# Patient Record
Sex: Female | Born: 1945 | Race: White | Hispanic: No | Smoking: Never smoker
Health system: Southern US, Community
[De-identification: ages and names within clinical notes are randomized; demographics above are authoritative.]

## PROBLEM LIST (undated history)

## (undated) DIAGNOSIS — I10 Essential (primary) hypertension: Secondary | ICD-10-CM

## (undated) DIAGNOSIS — I209 Angina pectoris, unspecified: Secondary | ICD-10-CM

## (undated) DIAGNOSIS — R351 Nocturia: Secondary | ICD-10-CM

## (undated) DIAGNOSIS — I509 Heart failure, unspecified: Secondary | ICD-10-CM

## (undated) DIAGNOSIS — K219 Gastro-esophageal reflux disease without esophagitis: Secondary | ICD-10-CM

## (undated) DIAGNOSIS — J45909 Unspecified asthma, uncomplicated: Secondary | ICD-10-CM

## (undated) DIAGNOSIS — IMO0001 Reserved for inherently not codable concepts without codable children: Secondary | ICD-10-CM

## (undated) DIAGNOSIS — Z87442 Personal history of urinary calculi: Secondary | ICD-10-CM

## (undated) DIAGNOSIS — S83209A Unspecified tear of unspecified meniscus, current injury, unspecified knee, initial encounter: Secondary | ICD-10-CM

## (undated) DIAGNOSIS — Z8489 Family history of other specified conditions: Secondary | ICD-10-CM

## (undated) DIAGNOSIS — I2510 Atherosclerotic heart disease of native coronary artery without angina pectoris: Secondary | ICD-10-CM

## (undated) DIAGNOSIS — G709 Myoneural disorder, unspecified: Secondary | ICD-10-CM

## (undated) DIAGNOSIS — M1990 Unspecified osteoarthritis, unspecified site: Secondary | ICD-10-CM

## (undated) DIAGNOSIS — R350 Frequency of micturition: Secondary | ICD-10-CM

## (undated) DIAGNOSIS — E119 Type 2 diabetes mellitus without complications: Secondary | ICD-10-CM

## (undated) DIAGNOSIS — F419 Anxiety disorder, unspecified: Secondary | ICD-10-CM

## (undated) DIAGNOSIS — I639 Cerebral infarction, unspecified: Secondary | ICD-10-CM

## (undated) DIAGNOSIS — E0590 Thyrotoxicosis, unspecified without thyrotoxic crisis or storm: Secondary | ICD-10-CM

## (undated) DIAGNOSIS — I219 Acute myocardial infarction, unspecified: Secondary | ICD-10-CM

## (undated) HISTORY — PX: CHOLECYSTECTOMY: SHX55

## (undated) HISTORY — PX: ABDOMINAL HYSTERECTOMY: SHX81

## (undated) HISTORY — DX: Atherosclerotic heart disease of native coronary artery without angina pectoris: I25.10

## (undated) HISTORY — PX: CARDIAC CATHETERIZATION: SHX172

## (undated) HISTORY — PX: COLONOSCOPY: SHX174

## (undated) HISTORY — PX: CATARACT EXTRACTION, BILATERAL: SHX1313

---

## 1978-11-17 DIAGNOSIS — I219 Acute myocardial infarction, unspecified: Secondary | ICD-10-CM

## 1978-11-17 HISTORY — DX: Acute myocardial infarction, unspecified: I21.9

## 1997-11-24 ENCOUNTER — Ambulatory Visit: Admission: RE | Admit: 1997-11-24 | Discharge: 1997-11-24 | Payer: Self-pay | Admitting: Pulmonary Disease

## 2013-04-06 ENCOUNTER — Encounter (HOSPITAL_COMMUNITY): Payer: Self-pay | Admitting: *Deleted

## 2013-04-06 ENCOUNTER — Telehealth: Payer: Self-pay

## 2013-04-06 ENCOUNTER — Other Ambulatory Visit: Payer: Self-pay

## 2013-04-06 DIAGNOSIS — K8689 Other specified diseases of pancreas: Secondary | ICD-10-CM

## 2013-04-06 NOTE — Telephone Encounter (Signed)
Pt has been scheduled for EUS referred by Dr Lyda Jester on 04/08/13 12 noon, pt needs to bring CT on a disc to the appt.  Pt needs instructions and meds reviewed. Left message on machine to call back

## 2013-04-06 NOTE — Telephone Encounter (Signed)
EUS scheduled, pt instructed and medications reviewed.   Patient to call with any questions or concerns. Pt will get CT to bring on disc

## 2013-04-07 ENCOUNTER — Encounter (HOSPITAL_COMMUNITY): Payer: Self-pay | Admitting: *Deleted

## 2013-04-07 ENCOUNTER — Encounter (HOSPITAL_COMMUNITY): Payer: Self-pay | Admitting: Pharmacy Technician

## 2013-04-08 ENCOUNTER — Encounter (HOSPITAL_COMMUNITY): Payer: Self-pay | Admitting: Gastroenterology

## 2013-04-08 ENCOUNTER — Encounter (HOSPITAL_COMMUNITY): Payer: Medicare Other | Admitting: Anesthesiology

## 2013-04-08 ENCOUNTER — Encounter (HOSPITAL_COMMUNITY)
Admission: RE | Disposition: A | Discharge status: Home / Self Care | Payer: Self-pay | Source: Ambulatory Visit | Attending: Gastroenterology

## 2013-04-08 ENCOUNTER — Ambulatory Visit (HOSPITAL_COMMUNITY)
Admission: RE | Admit: 2013-04-08 | Discharge: 2013-04-08 | Disposition: A | Discharge status: Home / Self Care | Payer: Medicare Other | Source: Ambulatory Visit | Attending: Gastroenterology | Admitting: Gastroenterology

## 2013-04-08 ENCOUNTER — Ambulatory Visit (HOSPITAL_COMMUNITY): Payer: Medicare Other | Admitting: Anesthesiology

## 2013-04-08 DIAGNOSIS — Z8673 Personal history of transient ischemic attack (TIA), and cerebral infarction without residual deficits: Secondary | ICD-10-CM | POA: Insufficient documentation

## 2013-04-08 DIAGNOSIS — E1149 Type 2 diabetes mellitus with other diabetic neurological complication: Secondary | ICD-10-CM | POA: Insufficient documentation

## 2013-04-08 DIAGNOSIS — E1142 Type 2 diabetes mellitus with diabetic polyneuropathy: Secondary | ICD-10-CM | POA: Insufficient documentation

## 2013-04-08 DIAGNOSIS — I10 Essential (primary) hypertension: Secondary | ICD-10-CM | POA: Insufficient documentation

## 2013-04-08 DIAGNOSIS — K8689 Other specified diseases of pancreas: Secondary | ICD-10-CM

## 2013-04-08 DIAGNOSIS — K7460 Unspecified cirrhosis of liver: Secondary | ICD-10-CM | POA: Insufficient documentation

## 2013-04-08 DIAGNOSIS — K869 Disease of pancreas, unspecified: Secondary | ICD-10-CM

## 2013-04-08 DIAGNOSIS — K219 Gastro-esophageal reflux disease without esophagitis: Secondary | ICD-10-CM | POA: Insufficient documentation

## 2013-04-08 DIAGNOSIS — I252 Old myocardial infarction: Secondary | ICD-10-CM | POA: Insufficient documentation

## 2013-04-08 DIAGNOSIS — R599 Enlarged lymph nodes, unspecified: Secondary | ICD-10-CM | POA: Insufficient documentation

## 2013-04-08 HISTORY — DX: Angina pectoris, unspecified: I20.9

## 2013-04-08 HISTORY — DX: Thyrotoxicosis, unspecified without thyrotoxic crisis or storm: E05.90

## 2013-04-08 HISTORY — DX: Type 2 diabetes mellitus without complications: E11.9

## 2013-04-08 HISTORY — DX: Myoneural disorder, unspecified: G70.9

## 2013-04-08 HISTORY — DX: Unspecified asthma, uncomplicated: J45.909

## 2013-04-08 HISTORY — DX: Anxiety disorder, unspecified: F41.9

## 2013-04-08 HISTORY — DX: Reserved for inherently not codable concepts without codable children: IMO0001

## 2013-04-08 HISTORY — DX: Cerebral infarction, unspecified: I63.9

## 2013-04-08 HISTORY — PX: EUS: SHX5427

## 2013-04-08 HISTORY — DX: Gastro-esophageal reflux disease without esophagitis: K21.9

## 2013-04-08 HISTORY — DX: Acute myocardial infarction, unspecified: I21.9

## 2013-04-08 HISTORY — DX: Essential (primary) hypertension: I10

## 2013-04-08 HISTORY — DX: Personal history of urinary calculi: Z87.442

## 2013-04-08 HISTORY — DX: Heart failure, unspecified: I50.9

## 2013-04-08 LAB — PANC CYST FLD ANLYS-PATHFNDR-TG

## 2013-04-08 SURGERY — UPPER ENDOSCOPIC ULTRASOUND (EUS) LINEAR
Anesthesia: Monitor Anesthesia Care

## 2013-04-08 MED ORDER — LIDOCAINE HCL (CARDIAC) 20 MG/ML IV SOLN
INTRAVENOUS | Status: DC | PRN
  Administered 2013-04-08: 50 mg via INTRAVENOUS

## 2013-04-08 MED ORDER — PROPOFOL 10 MG/ML IV BOLUS
INTRAVENOUS | Status: AC
  Filled 2013-04-08: qty 20

## 2013-04-08 MED ORDER — SODIUM CHLORIDE 0.9 % IV SOLN: INTRAVENOUS | Status: DC

## 2013-04-08 MED ORDER — CIPROFLOXACIN HCL 500 MG PO TABS: 500.0000 mg | ORAL_TABLET | Freq: Two times a day (BID) | ORAL | Status: DC

## 2013-04-08 MED ORDER — CIPROFLOXACIN IN D5W 400 MG/200ML IV SOLN
INTRAVENOUS | Status: DC | PRN
  Administered 2013-04-08: 400 mg via INTRAVENOUS

## 2013-04-08 MED ORDER — LACTATED RINGERS IV SOLN
INTRAVENOUS | Status: DC | PRN
  Administered 2013-04-08 (×2): via INTRAVENOUS

## 2013-04-08 MED ORDER — CIPROFLOXACIN IN D5W 400 MG/200ML IV SOLN
INTRAVENOUS | Status: AC
  Filled 2013-04-08: qty 200

## 2013-04-08 MED ORDER — PROPOFOL INFUSION 10 MG/ML OPTIME
INTRAVENOUS | Status: DC | PRN
  Administered 2013-04-08: 120 ug/kg/min via INTRAVENOUS

## 2013-04-08 NOTE — H&P (Signed)
  HPI: This is a woman found to have cystic lesion in tail of pancrase, periportal adenopathy, cirrhosis on recent imaging (referred by Dr. Lyda Jester)    Past Medical History  Diagnosis Date  . CHF (congestive heart failure)   . Hypertension   . Anginal pain   . Myocardial infarction   . Hyperthyroidism   . Asthma     occ. Bronchial problems  . Diabetes mellitus without complication   . 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 History of kidney stones   . GERD (gastroesophageal reflux disease)   . Neuromuscular disorder     diabetic neuropathy feet  . Bleeder's disease     "free Bleeder"  . Anxiety     Past Surgical History  Procedure Laterality Date  . Cholecystectomy      open  . Abdominal hysterectomy    . Cataract extraction, bilateral Bilateral   . Cardiac catheterization      8'14-[facility]    Current Facility-Administered Medications  Medication Dose Route Frequency Provider Last Rate Last Dose  . 0.9 %  sodium chloride infusion   Intravenous Continuous Milus Banister, MD        Allergies as of 04/06/2013  . (Not on File)    History reviewed. No pertinent family history.  History   Social History  . Marital Status: Widowed    Spouse Name: N/A    Number of Children: N/A  . Years of Education: N/A   Occupational History  . Not on file.   Social History Main Topics  . Smoking status: Never Smoker   . Smokeless tobacco: Never Used  . Alcohol Use: No  . Drug Use: No  . Sexual Activity: Not Currently   Other Topics Concern  . Not on file   Social History Narrative  . No narrative on file      Physical Exam: BP 132/71  Pulse 102  Temp(Src) 98.3 F (36.8 C) (Oral)  Resp 16  SpO2 98% Constitutional: generally well-appearing Psychiatric: alert and oriented x3 Abdomen: soft, nontender, nondistended, no obvious ascites, no peritoneal signs, normal bowel sounds     Assessment and plan: 68 y.o. female with  abnormal pancreas  For upper EUS today

## 2013-04-08 NOTE — Transfer of Care (Signed)
Immediate Anesthesia Transfer of Care Note  Patient: Casey Humphrey  Procedure(s) Performed: Procedure(s): UPPER ENDOSCOPIC ULTRASOUND (EUS) LINEAR (N/A)  Patient Location: PACU  Anesthesia Type:MAC  Level of Consciousness: sedated  Airway & Oxygen Therapy: Patient Spontanous Breathing and Patient connected to nasal cannula oxygen  Post-op Assessment: Report given to PACU RN and Post -op Vital signs reviewed and stable  Post vital signs: Reviewed and stable  Complications: No apparent anesthesia complications

## 2013-04-08 NOTE — Anesthesia Postprocedure Evaluation (Signed)
  Anesthesia Post-op Note  Patient: Casey Humphrey  Procedure(s) Performed: Procedure(s) (LRB): UPPER ENDOSCOPIC ULTRASOUND (EUS) LINEAR (N/A)  Patient Location: PACU  Anesthesia Type: MAC  Level of Consciousness: awake and alert   Airway and Oxygen Therapy: Patient Spontanous Breathing  Post-op Pain: mild  Post-op Assessment: Post-op Vital signs reviewed, Patient's Cardiovascular Status Stable, Respiratory Function Stable, Patent Airway and No signs of Nausea or vomiting  Last Vitals:  Filed Vitals:   04/08/13 1329  BP: 140/71  Pulse: 105  Temp: 36.8 C  Resp: 14    Post-op Vital Signs: stable   Complications: No apparent anesthesia complications

## 2013-04-08 NOTE — Op Note (Signed)
[facility] [place], [address]   ENDOSCOPIC ULTRASOUND PROCEDURE REPORT  PATIENT: Casey Humphrey, Casey Humphrey  MR#: [id number] BIRTHDATE: 1946-03-02  GENDER: Female ENDOSCOPIST: Milus Banister, MD REFERRED BY:  Kyra Leyland, M.D. PROCEDURE DATE:  04/08/2013 PROCEDURE:   Upper EUS w/FNA ASA CLASS:      Class IV INDICATIONS:   1.  recent CT scan showed cystic lesion in tail of pancreas, cirrhosis, periportal/hepatoduodenal LNs. MEDICATIONS: Cipro 400 mg IV and MAC sedation, administered by CRNA   DESCRIPTION OF PROCEDURE:   After the risks benefits and alternatives of the procedure were  explained, informed consent was obtained. The patient was then placed in the left, lateral, decubitus postion and IV sedation was administered. Throughout the procedure, the patients blood pressure, pulse and oxygen saturations were monitored continuously.  Under direct visualization, the Pentax Radial EUS [id number]  endoscope was introduced through the mouth  and advanced to the second portion of the duodenum .  Water was used as necessary to provide an acoustic interface.  Upon completion of the imaging, water was removed and the patient was sent to the recovery room in satisfactory condition.   Endoscopic findings: 1. Small, fleshy polyps in distal stomach.  Otherwise normal UGI tract.  EUS findings: 1. 4.3cm by 3cm cystic lesion in tail of pancreas. The lesion contains a central component (2.5cm across) of microcysts and there are larger cysts on either side. The there was not a clear solid component. The mass was sampled with a single pass with a 22 guage EUS FNA, transgastric, yielding 6cc of thin amber fluid (sent for CEA, amylase, cytology) 2. Pancreatic parenchyma was otherwise normal 3. Main pancreatic duct was normal, non-dilated and it did not clearly communicate with the cystic lesion in the tail 4. Periportal adenopathy appeared reactive. 5.  Limited views of liver, spleen, portal and splenic vessels were all normal  Impression: 4.3cm by 3cm cystic lesion in pancreatic tail. 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  Fluid was aspirated from the larger cystic cavity and sent for cytology, CEA, amylase.  The hepatoduodenal adenopathy appeared reactive.  Await final fluid tests for final recommendations.  She will complete 3 days of twice daily cipro.   _______________________________ eSigned:  Milus Banister, MD 04/08/2013 1:36 PM

## 2013-04-08 NOTE — Discharge Instructions (Signed)
YOU HAD AN ENDOSCOPIC PROCEDURE TODAY: Refer to the procedure report that was given to you for any specific questions about what was found during the examination.  If the procedure report does not answer your questions, please call your gastroenterologist to clarify. ° °YOU SHOULD EXPECT: Some feelings of bloating in the abdomen. Passage of more gas than usual.  Walking can help get rid of the air that was put into your GI tract during the procedure and reduce the bloating. If you had a lower endoscopy (such as a colonoscopy or flexible sigmoidoscopy) you may notice spotting of blood in your stool or on the toilet paper.  ° °DIET: Your first meal following the procedure should be a light meal and then it is ok to progress to your normal diet.  A half-sandwich or bowl of soup is an example of a good first meal.  Heavy or fried foods are harder to digest and may make you feel nasueas or bloated.  Drink plenty of fluids but you should avoid alcoholic beverages for 24 hours. ° °ACTIVITY: Your care partner should take you home directly after the procedure.  You should plan to take it easy, moving slowly for the rest of the day.  You can resume normal activity the day after the procedure however you should NOT DRIVE or use heavy machinery for 24 hours (because of the sedation medicines used during the test).   ° °SYMPTOMS TO REPORT IMMEDIATELY  °A gastroenterologist can be reached at any hour.  Please call your doctor's office for any of the following symptoms: ° °· Following lower endoscopy (colonoscopy, flexible sigmoidoscopy) ° Excessive amounts of blood in the stool ° Significant tenderness, worsening of abdominal pains ° Swelling of the abdomen that is new, acute ° Fever of 100° or higher °· Following upper endoscopy (EGD, EUS, ERCP) ° Vomiting of blood or coffee ground material ° New, significant abdominal pain ° New, significant chest pain or pain under the shoulder blades ° Painful or persistently difficult  swallowing ° New shortness of breath ° Black, tarry-looking stools ° °FOLLOW UP: °If any biopsies were taken you will be contacted by phone or by letter within the next 1-3 weeks.  Call your gastroenterologist if you have not heard about the biopsies in 3 weeks.  °Please also call your gastroenterologist's office with any specific questions about appointments or follow up tests. °Gastrointestinal Endoscopy °Care After °Refer to this sheet in the next few weeks. These instructions provide you with information on caring for yourself after your procedure. Your caregiver may also give you more specific instructions. Your treatment has been planned according to current medical practices, but problems sometimes occur. Call your caregiver if you have any problems or questions after your procedure. °HOME CARE INSTRUCTIONS °· If you were given medicine to help you relax (sedative), do not drive, operate machinery, or sign important documents for 24 hours. °· Avoid alcohol and hot or warm beverages for the first 24 hours after the procedure. °· Only take over-the-counter or prescription medicines for pain, discomfort, or fever as directed by your caregiver. You may resume taking your normal medicines unless your caregiver tells you otherwise. Ask your caregiver when you may resume taking medicines that may cause bleeding, such as aspirin, clopidogrel, or warfarin. °· You may return to your normal diet and activities on the day after your procedure, or as directed by your caregiver. Walking may help to reduce any bloated feeling in your abdomen. °· Drink enough fluids to   keep your urine clear or pale yellow. °· You may gargle with salt water if you have a sore throat. °SEEK IMMEDIATE MEDICAL CARE IF: °· You have severe nausea or vomiting. °· You have severe abdominal pain, abdominal cramps that last longer than 6 hours, or abdominal swelling (distention). °· You have severe shoulder or back pain. °· You have trouble  swallowing. °· You have shortness of breath, your breathing is shallow, or you are breathing faster than normal. °· You have a fever or a rapid heartbeat. °· You vomit blood or material that looks like coffee grounds. °· You have bloody, black, or tarry stools. °MAKE SURE YOU: °· Understand these instructions. °· Will watch your condition. °· Will get help right away if you are not doing well or get worse. °Document Released: 10/17/2003 Document Revised: 09/03/2011 Document Reviewed: 06/04/2011 °ExitCare® Patient Information ©2014 [employer]. ° °

## 2013-04-08 NOTE — Anesthesia Preprocedure Evaluation (Addendum)
Anesthesia Evaluation  Patient identified by MRN, date of birth, ID band Patient awake    Reviewed: Allergy & Precautions, H&P , NPO status , Patient's Chart, lab work & pertinent test results  Airway Mallampati: II TM Distance: >3 FB Neck ROM: Full    Dental no notable dental hx.    Pulmonary asthma ,  breath sounds clear to auscultation  Pulmonary exam normal       Cardiovascular hypertension, Pt. on medications + Past MI and +CHF Rhythm:Regular Rate:Normal     Neuro/Psych CVA, No Residual Symptoms negative neurological ROS  negative psych ROS   GI/Hepatic negative GI ROS, Neg liver ROS,   Endo/Other  diabetes, Type 2, Oral Hypoglycemic Agents  Renal/GU negative Renal ROS  negative genitourinary   Musculoskeletal negative musculoskeletal ROS (+)   Abdominal   Peds negative pediatric ROS (+)  Hematology negative hematology ROS (+)   Anesthesia Other Findings   Reproductive/Obstetrics negative OB ROS                          Anesthesia Physical Anesthesia Plan  ASA: III  Anesthesia Plan: MAC   Post-op Pain Management:    Induction:   Airway Management Planned:   Additional Equipment:   Intra-op Plan:   Post-operative Plan:   Informed Consent: I have reviewed the patients History and Physical, chart, labs and discussed the procedure including the risks, benefits and alternatives for the proposed anesthesia with the patient or authorized representative who has indicated his/her understanding and acceptance.   Dental advisory given  Plan Discussed with: CRNA  Anesthesia Plan Comments:         Anesthesia Quick Evaluation

## 2013-04-09 ENCOUNTER — Encounter (HOSPITAL_COMMUNITY): Payer: Self-pay | Admitting: Gastroenterology

## 2014-03-23 DIAGNOSIS — J0101 Acute recurrent maxillary sinusitis: Secondary | ICD-10-CM | POA: Diagnosis not present

## 2014-03-28 DIAGNOSIS — M1712 Unilateral primary osteoarthritis, left knee: Secondary | ICD-10-CM | POA: Diagnosis not present

## 2014-04-12 DIAGNOSIS — I5032 Chronic diastolic (congestive) heart failure: Secondary | ICD-10-CM | POA: Diagnosis not present

## 2014-04-12 DIAGNOSIS — I2510 Atherosclerotic heart disease of native coronary artery without angina pectoris: Secondary | ICD-10-CM | POA: Diagnosis not present

## 2014-04-12 DIAGNOSIS — E119 Type 2 diabetes mellitus without complications: Secondary | ICD-10-CM | POA: Diagnosis not present

## 2014-04-12 DIAGNOSIS — I110 Hypertensive heart disease with heart failure: Secondary | ICD-10-CM | POA: Diagnosis not present

## 2014-04-12 DIAGNOSIS — E785 Hyperlipidemia, unspecified: Secondary | ICD-10-CM | POA: Diagnosis not present

## 2014-04-13 ENCOUNTER — Other Ambulatory Visit: Payer: Self-pay | Admitting: Surgical

## 2014-04-21 NOTE — Patient Instructions (Addendum)
SHALEKA BRINES  04/21/2014   Your procedure is scheduled on: 04/27/14   Report to [facility] Main  Entrance and follow signs to               [place] at 9:30 AM.   Call this number if you have problems the morning of surgery [phone]   Remember:  Do not eat food or drink liquids :After Midnight.     Take these medicines the morning of surgery with A SIP OF WATER: LEVOTHYROXINE / METOPROLOL / RANITIDINE                               You may not have any metal on your body including hair pins and              piercings  Do not wear jewelry, make-up, lotions, powders or perfumes.             Do not wear nail polish.  Do not shave  48 hours prior to surgery.              Men may shave face and neck.   Do not bring valuables to the hospital. [place].  Contacts, dentures or bridgework may not be worn into surgery.  Leave suitcase in the car. After surgery it may be brought to your room.     Patients discharged the day of surgery will not be allowed to drive home.  Name and phone number of your driver:  Special Instructions: N/A              Please read over the following fact sheets you were given: _____________________________________________________________________                                                     [place]  Before surgery, you can play an important role.  Because skin is not sterile, your skin needs to be as free of germs as possible.  You can reduce the number of germs on your skin by washing with CHG (chlorahexidine gluconate) soap before surgery.  CHG is an antiseptic cleaner which kills germs and bonds with the skin to continue killing germs even after washing. Please DO NOT use if you have an allergy to CHG or antibacterial soaps.  If your skin becomes reddened/irritated stop using the CHG and inform your nurse when you arrive at Short Stay. Do  not shave (including legs and underarms) for at least 48 hours prior to the first CHG shower.  You may shave your face. Please follow these instructions carefully:   1.  Shower with CHG Soap the night before surgery and the  morning of Surgery.   2.  If you choose to wash your hair, wash your hair first as usual with your  normal  Shampoo.   3.  After you shampoo, rinse your hair and body thoroughly to remove the  shampoo.  4.  Use CHG as you would any other liquid soap.  You can apply chg directly  to the skin and wash . Gently wash with scrungie or clean wascloth    5.  Apply the CHG Soap to your body ONLY FROM THE NECK DOWN.   Do not use on open                           Wound or open sores. Avoid contact with eyes, ears mouth and genitals (private parts).                        Genitals (private parts) with your normal soap.              6.  Wash thoroughly, paying special attention to the area where your surgery  will be performed.   7.  Thoroughly rinse your body with warm water from the neck down.   8.  DO NOT shower/wash with your normal soap after using and rinsing off  the CHG Soap .                9.  Pat yourself dry with a clean towel.             10.  Wear clean pajamas.             11.  Place clean sheets on your bed the night of your first shower and do not  sleep with pets.  Day of Surgery : Do not apply any lotions/deodorants the morning of surgery.  Please wear clean clothes to the hospital/surgery center.  FAILURE TO FOLLOW THESE INSTRUCTIONS MAY RESULT IN THE CANCELLATION OF YOUR SURGERY    PATIENT SIGNATURE_________________________________  ______________________________________________________________________

## 2014-04-22 ENCOUNTER — Encounter (HOSPITAL_COMMUNITY)
Admission: RE | Admit: 2014-04-22 | Discharge: 2014-04-22 | Disposition: A | Discharge status: Home / Self Care | Payer: Medicare Other | Source: Ambulatory Visit | Attending: Orthopedic Surgery | Admitting: Orthopedic Surgery

## 2014-04-22 ENCOUNTER — Encounter (HOSPITAL_COMMUNITY): Payer: Self-pay

## 2014-04-22 DIAGNOSIS — Z880 Allergy status to penicillin: Secondary | ICD-10-CM | POA: Insufficient documentation

## 2014-04-22 DIAGNOSIS — Z7982 Long term (current) use of aspirin: Secondary | ICD-10-CM | POA: Insufficient documentation

## 2014-04-22 DIAGNOSIS — I10 Essential (primary) hypertension: Secondary | ICD-10-CM | POA: Diagnosis not present

## 2014-04-22 DIAGNOSIS — Z01812 Encounter for preprocedural laboratory examination: Secondary | ICD-10-CM | POA: Diagnosis not present

## 2014-04-22 DIAGNOSIS — E119 Type 2 diabetes mellitus without complications: Secondary | ICD-10-CM | POA: Diagnosis not present

## 2014-04-22 DIAGNOSIS — I252 Old myocardial infarction: Secondary | ICD-10-CM | POA: Diagnosis not present

## 2014-04-22 DIAGNOSIS — S83282A Other tear of lateral meniscus, current injury, left knee, initial encounter: Secondary | ICD-10-CM | POA: Insufficient documentation

## 2014-04-22 DIAGNOSIS — M179 Osteoarthritis of knee, unspecified: Secondary | ICD-10-CM | POA: Insufficient documentation

## 2014-04-22 DIAGNOSIS — J45909 Unspecified asthma, uncomplicated: Secondary | ICD-10-CM | POA: Insufficient documentation

## 2014-04-22 DIAGNOSIS — Z79899 Other long term (current) drug therapy: Secondary | ICD-10-CM | POA: Insufficient documentation

## 2014-04-22 DIAGNOSIS — X58XXXA Exposure to other specified factors, initial encounter: Secondary | ICD-10-CM | POA: Diagnosis not present

## 2014-04-22 DIAGNOSIS — I509 Heart failure, unspecified: Secondary | ICD-10-CM | POA: Insufficient documentation

## 2014-04-22 DIAGNOSIS — Z8673 Personal history of transient ischemic attack (TIA), and cerebral infarction without residual deficits: Secondary | ICD-10-CM | POA: Diagnosis not present

## 2014-04-22 DIAGNOSIS — K219 Gastro-esophageal reflux disease without esophagitis: Secondary | ICD-10-CM | POA: Diagnosis not present

## 2014-04-22 HISTORY — DX: Unspecified osteoarthritis, unspecified site: M19.90

## 2014-04-22 HISTORY — DX: Frequency of micturition: R35.0

## 2014-04-22 HISTORY — DX: Nocturia: R35.1

## 2014-04-22 HISTORY — DX: Reserved for inherently not codable concepts without codable children: IMO0001

## 2014-04-22 HISTORY — DX: Family history of other specified conditions: Z84.89

## 2014-04-22 HISTORY — DX: Unspecified tear of unspecified meniscus, current injury, unspecified knee, initial encounter: S83.209A

## 2014-04-22 LAB — CBC
HCT: 39.7 % (ref 36.0–46.0)
Hemoglobin: 13.2 g/dL (ref 12.0–15.0)
MCH: 33.8 pg (ref 26.0–34.0)
MCHC: 33.2 g/dL (ref 30.0–36.0)
MCV: 101.8 fL — ABNORMAL HIGH (ref 78.0–100.0)
Platelets: 124 10*3/uL — ABNORMAL LOW (ref 150–400)
RBC: 3.9 MIL/uL (ref 3.87–5.11)
RDW: 13.5 % (ref 11.5–15.5)
WBC: 6.2 10*3/uL (ref 4.0–10.5)

## 2014-04-22 LAB — BASIC METABOLIC PANEL
Anion gap: 7 (ref 5–15)
BUN: 18 mg/dL (ref 6–23)
CO2: 26 mmol/L (ref 19–32)
Calcium: 9.8 mg/dL (ref 8.4–10.5)
Chloride: 107 mmol/L (ref 96–112)
Creatinine, Ser: 0.82 mg/dL (ref 0.50–1.10)
GFR calc Af Amer: 82 mL/min — ABNORMAL LOW (ref >90)
GFR calc non Af Amer: 71 mL/min — ABNORMAL LOW (ref >90)
Glucose, Bld: 109 mg/dL — ABNORMAL HIGH (ref 70–99)
Potassium: 3.9 mmol/L (ref 3.5–5.1)
Sodium: 140 mmol/L (ref 135–145)

## 2014-04-22 NOTE — Progress Notes (Signed)
CBC results done 04/22/14 faxed via EPIC to Dr Gladstone Lighter.

## 2014-04-22 NOTE — H&P (Signed)
Casey Humphrey is an 69 y.o. female.   Chief Complaint: left knee pain HPI: The patient presented with the chief complaint of left knee pain. She reports that she has had issues with the left knee for about 4 months. No known injury. She also reports having swelling, locking, and catching in the knee. 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   Past Medical History  Diagnosis Date  . CHF (congestive heart failure)   . Hypertension   . Anginal pain   . Myocardial infarction   . Hyperthyroidism   . Asthma     occ. Bronchial problems  . Diabetes mellitus without complication   . 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 History of kidney stones   . GERD (gastroesophageal reflux disease)   . Neuromuscular disorder     diabetic neuropathy feet  . Bleeder's disease     "free Bleeder"  . Anxiety     Past Surgical History  Procedure Laterality Date  . Cholecystectomy      open  . Abdominal hysterectomy    . Cataract extraction, bilateral Bilateral   . Cardiac catheterization      8'14-[facility]  . Eus N/A 04/08/2013    Procedure: UPPER ENDOSCOPIC ULTRASOUND (EUS) LINEAR;  Surgeon: Milus Banister, MD;  Location: [facility] ENDOSCOPY;  Service: Endoscopy;  Laterality: N/A;    Social History:  reports that she has never smoked. She has never used smokeless tobacco. She reports that she does not drink alcohol or use illicit drugs.  Allergies:  Allergies  Allergen Reactions  . Codeine Nausea And Vomiting  . Quinine Derivatives Nausea And Vomiting  . Adhesive [Tape] Rash  . Penicillins Rash  . Procardia [Nifedipine] Rash    Current outpatient prescriptions:  .  aspirin EC 81 MG tablet, Take 81 mg by mouth daily., Disp: , Rfl:  .  furosemide (LASIX) 20 MG tablet, Take 20 mg by mouth every morning., Disp: , Rfl:  .  ipratropium (ATROVENT HFA) 17 MCG/ACT inhaler, Inhale 2 puffs into the lungs every 6 (six) hours., Disp: , Rfl:  .   levothyroxine (SYNTHROID, LEVOTHROID) 100 MCG tablet, Take 100 mcg by mouth daily before breakfast., Disp: , Rfl:  .  LORazepam (ATIVAN) 1 MG tablet, Take 0.5 mg by mouth daily as needed for anxiety., Disp: , Rfl:  .  losartan (COZAAR) 25 MG tablet, Take 25 mg by mouth every morning., Disp: , Rfl:  .  metFORMIN (GLUCOPHAGE-XR) 500 MG 24 hr tablet, Take 500 mg by mouth daily with breakfast., Disp: , Rfl:  .  metoprolol (LOPRESSOR) 50 MG tablet, Take 50 mg by mouth 2 (two) times daily., Disp: , Rfl:  .  polycarbophil (FIBERCON) 625 MG tablet, Take 625 mg by mouth daily., Disp: , Rfl:  .  ranitidine (ZANTAC) 150 MG tablet, Take 150 mg by mouth every morning., Disp: , Rfl:    Review of Systems  Constitutional: Positive for chills and diaphoresis. Negative for fever, weight loss and malaise/fatigue.  HENT: Negative.   Eyes: Negative.   Respiratory: Positive for shortness of breath. Negative for cough, hemoptysis, sputum production and wheezing.   Cardiovascular: Positive for palpitations and leg swelling. Negative for chest pain, orthopnea, claudication and PND.  Gastrointestinal: Negative.   Genitourinary: Negative for dysuria, urgency, frequency, hematuria and flank pain.       Positive for incontinence  Musculoskeletal: Positive for myalgias, back pain and joint pain. Negative for falls and  neck pain.  Skin: Negative.   Neurological: Negative for weakness.  Endo/Heme/Allergies: Negative for environmental allergies and polydipsia. Bruises/bleeds easily.  Psychiatric/Behavioral: Negative.    Vitals  Weight: 191 lb Height: 61in Body Surface Area: 1.85 m Body Mass Index: 36.09 kg/m  BP: 150/80 (Sitting, Left Arm, Standard) HR: 76 bpm  Physical Exam  Constitutional: She is oriented to person, place, and time. She appears well-developed. No distress.  Overweight  HENT:  Head: Normocephalic and atraumatic.  Right Ear: External ear normal.  Left Ear: External ear normal.  Nose:  Nose normal.  Mouth/Throat: Oropharynx is clear and moist.  Eyes: Conjunctivae and EOM are normal.  Neck: Normal range of motion. Neck supple.  Cardiovascular: Normal rate, regular rhythm, normal heart sounds and intact distal pulses.   No murmur heard. Respiratory: Effort normal and breath sounds normal. No respiratory distress. She has no wheezes.  GI: Soft. Bowel sounds are normal. She exhibits no distension. There is no tenderness.  Musculoskeletal:       Right hip: Normal.       Left hip: Normal.       Right knee: Normal.       Left knee: She exhibits decreased range of motion, swelling and effusion. She exhibits no erythema. Tenderness found. Medial joint line and lateral joint line tenderness noted.       Right lower leg: She exhibits no tenderness and no swelling.       Left lower leg: She exhibits no tenderness and no swelling.  5-125 degrees ROM in left knee Positive Mc[place]rays left knee  Neurological: She is alert and oriented to person, place, and time. She has normal strength and normal reflexes. No sensory deficit.  Skin: No rash noted. She is not diaphoretic. No erythema.  Psychiatric: She has a normal mood and affect. Her behavior is normal.     Assessment/Plan Left knee lateral meniscus tear She needs a left knee arthroscopy with lateral meniscus debridement. She does have significant arthritic changes in addition to the tear but the patient would like to see what relief she can get with the knee scope before deciding on total knee arthroplasty. Risks and benefits of teh procedure discussed with the patient by Dr. Latanya Maudlin.    H&P performed by Dr. Latanya Maudlin Documented by Ardeen Jourdain, PA-C  [place], Shaely Gadberry Ander Purpura 04/22/2014, 7:59 AM

## 2014-04-22 NOTE — Progress Notes (Signed)
CARDIAC CLEARANCE dR. mUNLEY CARDIOLOGIST ON CHART WITH EKG 03/2014

## 2014-04-27 ENCOUNTER — Ambulatory Visit (HOSPITAL_COMMUNITY)
Admission: RE | Admit: 2014-04-27 | Discharge: 2014-04-27 | Disposition: A | Discharge status: Home / Self Care | Payer: Medicare Other | Source: Ambulatory Visit | Attending: Orthopedic Surgery | Admitting: Orthopedic Surgery

## 2014-04-27 ENCOUNTER — Encounter (HOSPITAL_COMMUNITY)
Admission: RE | Disposition: A | Discharge status: Home / Self Care | Payer: Self-pay | Source: Ambulatory Visit | Attending: Orthopedic Surgery

## 2014-04-27 ENCOUNTER — Ambulatory Visit (HOSPITAL_COMMUNITY): Payer: Medicare Other | Admitting: Anesthesiology

## 2014-04-27 ENCOUNTER — Encounter (HOSPITAL_COMMUNITY): Payer: Self-pay | Admitting: *Deleted

## 2014-04-27 DIAGNOSIS — M23301 Other meniscus derangements, unspecified lateral meniscus, left knee: Secondary | ICD-10-CM | POA: Diagnosis not present

## 2014-04-27 DIAGNOSIS — J45909 Unspecified asthma, uncomplicated: Secondary | ICD-10-CM | POA: Insufficient documentation

## 2014-04-27 DIAGNOSIS — M1712 Unilateral primary osteoarthritis, left knee: Secondary | ICD-10-CM | POA: Diagnosis not present

## 2014-04-27 DIAGNOSIS — E039 Hypothyroidism, unspecified: Secondary | ICD-10-CM | POA: Insufficient documentation

## 2014-04-27 DIAGNOSIS — M179 Osteoarthritis of knee, unspecified: Secondary | ICD-10-CM | POA: Diagnosis not present

## 2014-04-27 DIAGNOSIS — M23304 Other meniscus derangements, unspecified medial meniscus, left knee: Secondary | ICD-10-CM | POA: Diagnosis not present

## 2014-04-27 DIAGNOSIS — S83242A Other tear of medial meniscus, current injury, left knee, initial encounter: Secondary | ICD-10-CM | POA: Diagnosis not present

## 2014-04-27 DIAGNOSIS — X58XXXA Exposure to other specified factors, initial encounter: Secondary | ICD-10-CM | POA: Diagnosis not present

## 2014-04-27 DIAGNOSIS — Y939 Activity, unspecified: Secondary | ICD-10-CM | POA: Insufficient documentation

## 2014-04-27 DIAGNOSIS — E119 Type 2 diabetes mellitus without complications: Secondary | ICD-10-CM | POA: Diagnosis not present

## 2014-04-27 DIAGNOSIS — I509 Heart failure, unspecified: Secondary | ICD-10-CM | POA: Diagnosis not present

## 2014-04-27 DIAGNOSIS — S83282A Other tear of lateral meniscus, current injury, left knee, initial encounter: Secondary | ICD-10-CM | POA: Insufficient documentation

## 2014-04-27 DIAGNOSIS — K219 Gastro-esophageal reflux disease without esophagitis: Secondary | ICD-10-CM | POA: Insufficient documentation

## 2014-04-27 DIAGNOSIS — Z8673 Personal history of transient ischemic attack (TIA), and cerebral infarction without residual deficits: Secondary | ICD-10-CM | POA: Diagnosis not present

## 2014-04-27 DIAGNOSIS — Y929 Unspecified place or not applicable: Secondary | ICD-10-CM | POA: Diagnosis not present

## 2014-04-27 DIAGNOSIS — I10 Essential (primary) hypertension: Secondary | ICD-10-CM | POA: Diagnosis not present

## 2014-04-27 DIAGNOSIS — Y999 Unspecified external cause status: Secondary | ICD-10-CM | POA: Diagnosis not present

## 2014-04-27 DIAGNOSIS — I252 Old myocardial infarction: Secondary | ICD-10-CM | POA: Insufficient documentation

## 2014-04-27 HISTORY — PX: CHONDROPLASTY: SHX5177

## 2014-04-27 HISTORY — PX: KNEE ARTHROSCOPY WITH LATERAL MENISECTOMY: SHX6193

## 2014-04-27 LAB — GLUCOSE, CAPILLARY
Glucose-Capillary: 101 mg/dL — ABNORMAL HIGH (ref 70–99)
Glucose-Capillary: 127 mg/dL — ABNORMAL HIGH (ref 70–99)
Glucose-Capillary: 175 mg/dL — ABNORMAL HIGH (ref 70–99)

## 2014-04-27 SURGERY — CHONDROPLASTY
Anesthesia: General | Site: Knee | Laterality: Left

## 2014-04-27 MED ORDER — PROMETHAZINE HCL 25 MG/ML IJ SOLN
6.2500 mg | INTRAMUSCULAR | Status: DC | PRN
  Administered 2014-04-27: 6.25 mg via INTRAVENOUS
  Filled 2014-04-27: qty 1

## 2014-04-27 MED ORDER — STERILE WATER FOR IRRIGATION IR SOLN
Status: DC | PRN
  Administered 2014-04-27: 500 mL

## 2014-04-27 MED ORDER — BACITRACIN ZINC 500 UNIT/GM EX OINT
TOPICAL_OINTMENT | CUTANEOUS | Status: DC | PRN
  Administered 2014-04-27: 1 via TOPICAL

## 2014-04-27 MED ORDER — ONDANSETRON HCL 4 MG/2ML IJ SOLN
INTRAMUSCULAR | Status: DC | PRN
  Administered 2014-04-27 (×2): 2 mg via INTRAVENOUS

## 2014-04-27 MED ORDER — FENTANYL CITRATE 0.05 MG/ML IJ SOLN
INTRAMUSCULAR | Status: DC | PRN
  Administered 2014-04-27: 25 ug via INTRAVENOUS
  Administered 2014-04-27 (×2): 50 ug via INTRAVENOUS
  Administered 2014-04-27: 25 ug via INTRAVENOUS

## 2014-04-27 MED ORDER — BUPIVACAINE-EPINEPHRINE (PF) 0.25% -1:200000 IJ SOLN
INTRAMUSCULAR | Status: AC
  Filled 2014-04-27: qty 30

## 2014-04-27 MED ORDER — ACETAMINOPHEN 10 MG/ML IV SOLN: 1000.0000 mg | Freq: Once | INTRAVENOUS | Status: DC

## 2014-04-27 MED ORDER — LIDOCAINE HCL (CARDIAC) 20 MG/ML IV SOLN
INTRAVENOUS | Status: DC | PRN
  Administered 2014-04-27: 50 mg via INTRAVENOUS

## 2014-04-27 MED ORDER — METOCLOPRAMIDE HCL 5 MG/ML IJ SOLN
INTRAMUSCULAR | Status: AC
  Filled 2014-04-27: qty 2

## 2014-04-27 MED ORDER — LACTATED RINGERS IV SOLN
INTRAVENOUS | Status: DC
  Administered 2014-04-27 (×2): 1000 mL via INTRAVENOUS

## 2014-04-27 MED ORDER — LIDOCAINE HCL (CARDIAC) 20 MG/ML IV SOLN
INTRAVENOUS | Status: AC
  Filled 2014-04-27: qty 5

## 2014-04-27 MED ORDER — PROPOFOL 10 MG/ML IV BOLUS
INTRAVENOUS | Status: DC | PRN
  Administered 2014-04-27: 90 mg via INTRAVENOUS

## 2014-04-27 MED ORDER — ONDANSETRON HCL 4 MG/2ML IJ SOLN
INTRAMUSCULAR | Status: AC
  Filled 2014-04-27: qty 2

## 2014-04-27 MED ORDER — DEXAMETHASONE SODIUM PHOSPHATE 10 MG/ML IJ SOLN
INTRAMUSCULAR | Status: DC | PRN
  Administered 2014-04-27: 10 mg via INTRAVENOUS

## 2014-04-27 MED ORDER — METOCLOPRAMIDE HCL 5 MG/ML IJ SOLN
10.0000 mg | Freq: Once | INTRAMUSCULAR | Status: AC | PRN
  Administered 2014-04-27: 10 mg via INTRAVENOUS

## 2014-04-27 MED ORDER — FENTANYL CITRATE 0.05 MG/ML IJ SOLN
INTRAMUSCULAR | Status: AC
  Filled 2014-04-27: qty 5

## 2014-04-27 MED ORDER — BUPIVACAINE-EPINEPHRINE 0.25% -1:200000 IJ SOLN
INTRAMUSCULAR | Status: DC | PRN
  Administered 2014-04-27: 30 mL

## 2014-04-27 MED ORDER — CLINDAMYCIN PHOSPHATE 900 MG/50ML IV SOLN
900.0000 mg | INTRAVENOUS | Status: AC
  Administered 2014-04-27: 900 mg via INTRAVENOUS

## 2014-04-27 MED ORDER — LACTATED RINGERS IR SOLN
Status: DC | PRN
  Administered 2014-04-27: 3000 mL

## 2014-04-27 MED ORDER — PROPOFOL 10 MG/ML IV BOLUS
INTRAVENOUS | Status: AC
  Filled 2014-04-27: qty 20

## 2014-04-27 MED ORDER — HYDROMORPHONE HCL 1 MG/ML IJ SOLN
INTRAMUSCULAR | Status: AC
  Filled 2014-04-27: qty 1

## 2014-04-27 MED ORDER — MIDAZOLAM HCL 2 MG/2ML IJ SOLN
INTRAMUSCULAR | Status: AC
  Filled 2014-04-27: qty 2

## 2014-04-27 MED ORDER — ACETAMINOPHEN 10 MG/ML IV SOLN
1000.0000 mg | Freq: Once | INTRAVENOUS | Status: AC
  Administered 2014-04-27: 1000 mg via INTRAVENOUS
  Filled 2014-04-27: qty 100

## 2014-04-27 MED ORDER — HYDROMORPHONE HCL 2 MG PO TABS
2.0000 mg | ORAL_TABLET | ORAL | Status: DC | PRN
Start: 2014-04-27 — End: 2017-11-04

## 2014-04-27 MED ORDER — LACTATED RINGERS IV SOLN
INTRAVENOUS | Status: DC | PRN
  Administered 2014-04-27: 11:00:00 via INTRAVENOUS

## 2014-04-27 MED ORDER — HYDROMORPHONE HCL 1 MG/ML IJ SOLN
0.2500 mg | INTRAMUSCULAR | Status: DC | PRN
  Administered 2014-04-27 (×2): 0.5 mg via INTRAVENOUS

## 2014-04-27 MED ORDER — CLINDAMYCIN PHOSPHATE 900 MG/50ML IV SOLN
INTRAVENOUS | Status: AC
  Filled 2014-04-27: qty 50

## 2014-04-27 MED ORDER — BACITRACIN ZINC 500 UNIT/GM EX OINT
TOPICAL_OINTMENT | CUTANEOUS | Status: AC
  Filled 2014-04-27: qty 28.35

## 2014-04-27 MED ORDER — SODIUM CHLORIDE 0.9 % IV SOLN
10.0000 mg | INTRAVENOUS | Status: DC | PRN
  Administered 2014-04-27: 40 ug/min via INTRAVENOUS

## 2014-04-27 MED ORDER — CHLORHEXIDINE GLUCONATE 4 % EX LIQD: 60.0000 mL | Freq: Once | CUTANEOUS | Status: DC

## 2014-04-27 MED ORDER — DEXAMETHASONE SODIUM PHOSPHATE 10 MG/ML IJ SOLN
INTRAMUSCULAR | Status: AC
  Filled 2014-04-27: qty 1

## 2014-04-27 SURGICAL SUPPLY — 29 items
BANDAGE ELASTIC 4 VELCRO ST LF (GAUZE/BANDAGES/DRESSINGS) ×2 IMPLANT
BANDAGE ELASTIC 6 VELCRO ST LF (GAUZE/BANDAGES/DRESSINGS) ×2 IMPLANT
BLADE GREAT WHITE 4.2 (BLADE) ×2 IMPLANT
BLADE SURG SZ11 CARB STEEL (BLADE) ×2 IMPLANT
BNDG COHESIVE 6X5 TAN STRL LF (GAUZE/BANDAGES/DRESSINGS) ×2 IMPLANT
DRAPE SHEET LG 3/4 BI-LAMINATE (DRAPES) ×2 IMPLANT
DRSG ADAPTIC 3X8 NADH LF (GAUZE/BANDAGES/DRESSINGS) ×2 IMPLANT
DRSG EMULSION OIL 3X3 NADH (GAUZE/BANDAGES/DRESSINGS) ×2 IMPLANT
DRSG PAD ABDOMINAL 8X10 ST (GAUZE/BANDAGES/DRESSINGS) ×4 IMPLANT
DURAPREP 26ML APPLICATOR (WOUND CARE) ×2 IMPLANT
GLOVE BIOGEL PI IND STRL 8 (GLOVE) ×1 IMPLANT
GLOVE BIOGEL PI INDICATOR 8 (GLOVE) ×1
GLOVE ECLIPSE 8.0 STRL XLNG CF (GLOVE) ×2 IMPLANT
GOWN STRL REUS W/TWL LRG LVL3 (GOWN DISPOSABLE) IMPLANT
GOWN STRL REUS W/TWL XL LVL3 (GOWN DISPOSABLE) ×4 IMPLANT
KIT BASIN OR (CUSTOM PROCEDURE TRAY) IMPLANT
MANIFOLD NEPTUNE II (INSTRUMENTS) ×2 IMPLANT
PACK ARTHROSCOPY WL (CUSTOM PROCEDURE TRAY) ×2 IMPLANT
PACK ICE MAXI GEL EZY WRAP (MISCELLANEOUS) ×2 IMPLANT
PAD ABD 8X10 STRL (GAUZE/BANDAGES/DRESSINGS) ×2 IMPLANT
PAD MASON LEG HOLDER (PIN) ×2 IMPLANT
SET ARTHROSCOPY TUBING (MISCELLANEOUS) ×1
SET ARTHROSCOPY TUBING LN (MISCELLANEOUS) ×1 IMPLANT
SUT ETHILON 3 0 PS 1 (SUTURE) ×2 IMPLANT
SYR 20CC LL (SYRINGE) ×2 IMPLANT
TOWEL OR 17X26 10 PK STRL BLUE (TOWEL DISPOSABLE) ×2 IMPLANT
TUBING CONNECTING 10 (TUBING) ×2 IMPLANT
WAND 90 DEG TURBOVAC W/CORD (SURGICAL WAND) IMPLANT
WRAP KNEE MAXI GEL POST OP (GAUZE/BANDAGES/DRESSINGS) ×2 IMPLANT

## 2014-04-27 NOTE — Anesthesia Preprocedure Evaluation (Addendum)
Anesthesia Evaluation  Patient identified by MRN, date of birth, ID band Patient awake    Reviewed: Allergy & Precautions, NPO status , Patient's Chart, lab work & pertinent test results, reviewed documented beta blocker date and time   Airway Mallampati: I  TM Distance: >3 FB     Dental  (+) Edentulous Upper, Edentulous Lower, Dental Advisory Given   Pulmonary asthma ,    Pulmonary exam normal       Cardiovascular hypertension, + angina + Past MI and +CHF     Neuro/Psych Anxiety  Neuromuscular disease CVA    GI/Hepatic Neg liver ROS, GERD-  ,  Endo/Other  diabetesHyperthyroidism   Renal/GU negative Renal ROS     Musculoskeletal   Abdominal   Peds  Hematology   Anesthesia Other Findings   Reproductive/Obstetrics                            Anesthesia Physical Anesthesia Plan  ASA: III  Anesthesia Plan: General   Post-op Pain Management:    Induction: Intravenous  Airway Management Planned: Oral ETT and LMA  Additional Equipment:   Intra-op Plan:   Post-operative Plan: Extubation in OR  Informed Consent: I have reviewed the patients History and Physical, chart, labs and discussed the procedure including the risks, benefits and alternatives for the proposed anesthesia with the patient or authorized representative who has indicated his/her understanding and acceptance.   Dental advisory given  Plan Discussed with: CRNA, Anesthesiologist and Surgeon  Anesthesia Plan Comments:        Anesthesia Quick Evaluation

## 2014-04-27 NOTE — Transfer of Care (Signed)
Immediate Anesthesia Transfer of Care Note  Patient: Casey Humphrey  Procedure(s) Performed: Procedure(s): LEFT KNEE ARTHROSCOPY WITH  medial and LATERAL MENISECTOMY. (Left) abrasion CHONDROPLASTY of medial femoral condyle, abrasion chondroplasty of patella (Left)  Patient Location: PACU  Anesthesia Type:General  Level of Consciousness: awake, oriented, patient cooperative, lethargic and responds to stimulation  Airway & Oxygen Therapy: Patient Spontanous Breathing and Patient connected to face mask oxygen  Post-op Assessment: Report given to RN, Post -op Vital signs reviewed and stable and Patient moving all extremities  Post vital signs: Reviewed and stable  Last Vitals:  Filed Vitals:   04/27/14 0925  BP: 140/63  Pulse: 63  Temp: 36.8 C  Resp: 16    Complications: No apparent anesthesia complications

## 2014-04-27 NOTE — Discharge Instructions (Signed)

## 2014-04-27 NOTE — Interval H&P Note (Signed)
History and Physical Interval Note:  04/27/2014 11:02 AM  Casey Humphrey  has presented today for surgery, with the diagnosis of left knee lateral meniscal tear  The various methods of treatment have been discussed with the patient and family. After consideration of risks, benefits and other options for treatment, the patient has consented to  Procedure(s): LEFT KNEE ARTHROSCOPY WITH LATERAL MENISECTOMY (Left) as a surgical intervention .  The patient's history has been reviewed, patient examined, no change in status, stable for surgery.  I have reviewed the patient's chart and labs.  Questions were answered to the patient's satisfaction.     GIOFFRE,RONALD A

## 2014-04-27 NOTE — Progress Notes (Signed)
CBG 127 

## 2014-04-27 NOTE — Anesthesia Procedure Notes (Signed)
Procedure Name: LMA Insertion Date/Time: 04/27/2014 11:21 AM Performed by: Ofilia Neas Pre-anesthesia Checklist: Patient identified, Emergency Drugs available, Suction available, Patient being monitored and Timeout performed Patient Re-evaluated:Patient Re-evaluated prior to inductionOxygen Delivery Method: Circle system utilized Preoxygenation: Pre-oxygenation with 100% oxygen Intubation Type: IV induction LMA: LMA inserted LMA Size: 4.0 Number of attempts: 1 Placement Confirmation: positive ETCO2 Tube secured with: Tape (paper tape) Dental Injury: Teeth and Oropharynx as per pre-operative assessment

## 2014-04-27 NOTE — Anesthesia Postprocedure Evaluation (Signed)
Anesthesia Post Note  Patient: Casey Humphrey  Procedure(s) Performed: Procedure(s) (LRB): LEFT KNEE ARTHROSCOPY WITH  medial and LATERAL MENISECTOMY. (Left) abrasion CHONDROPLASTY of medial femoral condyle, abrasion chondroplasty of patella (Left)  Anesthesia type: general  Patient location: PACU  Post pain: Pain level controlled  Post assessment: Patient's Cardiovascular Status Stable  Last Vitals:  Filed Vitals:   04/27/14 1300  BP: 137/66  Pulse: 73  Temp:   Resp: 11    Post vital signs: Reviewed and stable  Level of consciousness: sedated  Complications: No apparent anesthesia complications

## 2014-04-27 NOTE — Progress Notes (Signed)
Nauseated and vomited approx 25 cc greenish emesis; Dr. Tobias Alexander, anesthes, called, updated about pt; order rec'd and med given

## 2014-04-27 NOTE — Brief Op Note (Signed)
04/27/2014  12:10 PM  PATIENT:  Casey Humphrey  69 y.o. female  PRE-OPERATIVE DIAGNOSIS:  left knee lateral meniscal tear:Medial Meniscus Tear and Primary Osteoarthritis   POST-OPERATIVE DIAGNOSIS:   primary osteoarthritis left knee. lateral and medial meniscal tear,   PROCEDURE:  Procedure(s): LEFT KNEE ARTHROSCOPY WITH  medial and LATERAL MENISECTOMY. (Left) abrasion CHONDROPLASTY of medial femoral condyle, abrasion chondroplasty of patella (Left)  SURGEON:  Surgeon(s) and Role:    * Tobi Bastos, MD - Primary    ASSISTANTS:OR Nurse   ANESTHESIA:   general  EBL:     BLOOD ADMINISTERED:none  DRAINS: none   LOCAL MEDICATIONS USED:  MARCAINE 30cc of 0.25% with Epinephrine    SPECIMEN:  No Specimen  DISPOSITION OF SPECIMEN:  N/A  COUNTS:  YES  TOURNIQUET:  * No tourniquets in log *  DICTATION: .Other Dictation: Dictation Number [id number]  PLAN OF CARE: Discharge to home after PACU  PATIENT DISPOSITION:  PACU - hemodynamically stable.   Delay start of Pharmacological VTE agent (>24hrs) due to surgical blood loss or risk of bleeding: yes

## 2014-04-27 NOTE — Progress Notes (Signed)
Pt very drowsy; arouses easily but falls right back to sleep; SAO2 97% on 2l/cannula; drops to 88-90 without oxygen; pt was nauseated briefly but nausea is relieved; pt able to cough and deep breath well; just drowsy; will continue to observe

## 2014-04-28 ENCOUNTER — Encounter (HOSPITAL_COMMUNITY): Payer: Self-pay | Admitting: Orthopedic Surgery

## 2014-04-28 NOTE — Op Note (Signed)
Casey Humphrey, Casey Humphrey                ACCOUNT NO.:  [id number]  MEDICAL RECORD NO.:  [id number]  LOCATION:  [facility]                         FACILITY:  [facility]  PHYSICIAN:  Kipp Brood. Gioffre, M.D.DATE OF BIRTH:  October 21, 1945  DATE OF PROCEDURE:  04/27/2014 DATE OF DISCHARGE:  04/27/2014                              OPERATIVE REPORT   SURGEON:  Jori Moll A. Gladstone Lighter, M.D.  OPERATIVE ASSISTANT:  OR nurse.  PREOPERATIVE DIAGNOSES: 1. Primary osteoarthritis, left knee. 2. Torn medial meniscus, left knee. 3. Torn lateral meniscus, left knee.  POSTOPERATIVE DIAGNOSES: 1. Primary osteoarthritis, left knee. 2. Torn medial meniscus, left knee. 3. Torn lateral meniscus, left knee.  OPERATION: 1. Diagnostic arthroscopy, left knee. 2. Medial meniscectomy, left knee. 3. Lateral meniscectomy, left knee. 4. Abrasion chondroplasty, medial femoral condyle left knee. 5. Abrasion chondroplasty of the patella, left knee. 6. Synovectomy, left knee suprapatellar pouch.  DESCRIPTION OF PROCEDURE:  Under general anesthesia, routine orthopedic prep and draping of the left lower extremity was carried out.  The patient's left leg was placed in a knee holder.  The appropriate time- out was first carried out.  I also marked the appropriate left leg in the holding area.  As I mentioned, sterile prep was done and draping.  A small incision was made in suprapatellar pouch.  Inflow cannula was inserted.  Knee was distended with saline.  Another small punctate incision was made in the anterolateral joint.  The arthroscope was entered from lateral approach and a complete diagnostic arthroscopy was carried out.  I went up into the suprapatellar pouch.  I utilized the [employer] and I did an abrasion chondroplasty of the patella down the bleeding bone, also the synovectomy of the left knee.  I then went down into the medial joint.  She had severe primary osteoarthritis.  I did an abrasion chondroplasty of the medial  femoral condyle, and then a partial medial meniscectomy.  I went over across the knee joint.  The cruciates were intact.  In the lateral joint, I did a lateral meniscectomy.  I thoroughly irrigated out the knee, reinspected the knee to highlight there is a left total knee arthroplasty.  Cartilaginous surfaces of the joint defects were severe.  After thoroughly irrigating out the knee, I closed all 3 punctate incisions with 3-0 nylon suture.  I injected 30 mL of 0.25% Marcaine with epinephrine in the knee joint and a sterile Neosporin dressing was applied.  The patient had 900 mg of clindamycin preop.          ______________________________ Kipp Brood. Gladstone Lighter, M.D.     RAG/MEDQ  D:  04/27/2014  T:  04/28/2014  Job:  [id number]

## 2014-07-27 DIAGNOSIS — K7469 Other cirrhosis of liver: Secondary | ICD-10-CM | POA: Diagnosis not present

## 2014-07-27 DIAGNOSIS — R109 Unspecified abdominal pain: Secondary | ICD-10-CM | POA: Diagnosis not present

## 2014-07-27 DIAGNOSIS — K210 Gastro-esophageal reflux disease with esophagitis: Secondary | ICD-10-CM | POA: Diagnosis not present

## 2014-08-29 DIAGNOSIS — K210 Gastro-esophageal reflux disease with esophagitis: Secondary | ICD-10-CM | POA: Diagnosis not present

## 2014-10-06 DIAGNOSIS — Z1231 Encounter for screening mammogram for malignant neoplasm of breast: Secondary | ICD-10-CM | POA: Diagnosis not present

## 2014-11-03 DIAGNOSIS — E119 Type 2 diabetes mellitus without complications: Secondary | ICD-10-CM | POA: Diagnosis not present

## 2014-11-10 DIAGNOSIS — M1990 Unspecified osteoarthritis, unspecified site: Secondary | ICD-10-CM | POA: Diagnosis not present

## 2014-11-10 DIAGNOSIS — R7302 Impaired glucose tolerance (oral): Secondary | ICD-10-CM | POA: Diagnosis not present

## 2014-11-10 DIAGNOSIS — I10 Essential (primary) hypertension: Secondary | ICD-10-CM | POA: Diagnosis not present

## 2014-11-30 DIAGNOSIS — K210 Gastro-esophageal reflux disease with esophagitis: Secondary | ICD-10-CM | POA: Diagnosis not present

## 2015-02-03 DIAGNOSIS — Z01419 Encounter for gynecological examination (general) (routine) without abnormal findings: Secondary | ICD-10-CM | POA: Diagnosis not present

## 2015-02-16 DIAGNOSIS — M1712 Unilateral primary osteoarthritis, left knee: Secondary | ICD-10-CM | POA: Diagnosis not present

## 2015-02-16 DIAGNOSIS — M1711 Unilateral primary osteoarthritis, right knee: Secondary | ICD-10-CM | POA: Diagnosis not present

## 2015-02-16 DIAGNOSIS — M170 Bilateral primary osteoarthritis of knee: Secondary | ICD-10-CM | POA: Diagnosis not present

## 2015-05-19 DIAGNOSIS — M170 Bilateral primary osteoarthritis of knee: Secondary | ICD-10-CM | POA: Diagnosis not present

## 2015-05-19 DIAGNOSIS — M1712 Unilateral primary osteoarthritis, left knee: Secondary | ICD-10-CM | POA: Diagnosis not present

## 2015-05-19 DIAGNOSIS — M1711 Unilateral primary osteoarthritis, right knee: Secondary | ICD-10-CM | POA: Diagnosis not present

## 2015-05-26 DIAGNOSIS — M1711 Unilateral primary osteoarthritis, right knee: Secondary | ICD-10-CM | POA: Diagnosis not present

## 2015-05-26 DIAGNOSIS — M1712 Unilateral primary osteoarthritis, left knee: Secondary | ICD-10-CM | POA: Diagnosis not present

## 2015-05-26 DIAGNOSIS — M170 Bilateral primary osteoarthritis of knee: Secondary | ICD-10-CM | POA: Diagnosis not present

## 2015-06-02 DIAGNOSIS — M1711 Unilateral primary osteoarthritis, right knee: Secondary | ICD-10-CM | POA: Diagnosis not present

## 2015-06-02 DIAGNOSIS — M1712 Unilateral primary osteoarthritis, left knee: Secondary | ICD-10-CM | POA: Diagnosis not present

## 2015-06-02 DIAGNOSIS — M170 Bilateral primary osteoarthritis of knee: Secondary | ICD-10-CM | POA: Diagnosis not present

## 2015-06-21 DIAGNOSIS — D500 Iron deficiency anemia secondary to blood loss (chronic): Secondary | ICD-10-CM | POA: Diagnosis not present

## 2015-07-14 DIAGNOSIS — M170 Bilateral primary osteoarthritis of knee: Secondary | ICD-10-CM | POA: Diagnosis not present

## 2015-07-17 DIAGNOSIS — J0101 Acute recurrent maxillary sinusitis: Secondary | ICD-10-CM | POA: Diagnosis not present

## 2015-07-19 DIAGNOSIS — D500 Iron deficiency anemia secondary to blood loss (chronic): Secondary | ICD-10-CM | POA: Diagnosis not present

## 2015-07-19 DIAGNOSIS — R935 Abnormal findings on diagnostic imaging of other abdominal regions, including retroperitoneum: Secondary | ICD-10-CM | POA: Diagnosis not present

## 2015-07-19 DIAGNOSIS — K7469 Other cirrhosis of liver: Secondary | ICD-10-CM | POA: Diagnosis not present

## 2015-07-25 DIAGNOSIS — E785 Hyperlipidemia, unspecified: Secondary | ICD-10-CM | POA: Insufficient documentation

## 2015-07-25 DIAGNOSIS — I5032 Chronic diastolic (congestive) heart failure: Secondary | ICD-10-CM | POA: Insufficient documentation

## 2015-07-26 DIAGNOSIS — I5032 Chronic diastolic (congestive) heart failure: Secondary | ICD-10-CM | POA: Diagnosis not present

## 2015-07-26 DIAGNOSIS — I110 Hypertensive heart disease with heart failure: Secondary | ICD-10-CM | POA: Diagnosis not present

## 2015-07-26 DIAGNOSIS — E785 Hyperlipidemia, unspecified: Secondary | ICD-10-CM | POA: Diagnosis not present

## 2015-07-26 DIAGNOSIS — I2510 Atherosclerotic heart disease of native coronary artery without angina pectoris: Secondary | ICD-10-CM | POA: Diagnosis not present

## 2015-08-25 DIAGNOSIS — M1712 Unilateral primary osteoarthritis, left knee: Secondary | ICD-10-CM | POA: Diagnosis not present

## 2015-08-25 DIAGNOSIS — M170 Bilateral primary osteoarthritis of knee: Secondary | ICD-10-CM | POA: Diagnosis not present

## 2015-09-28 DIAGNOSIS — S81801A Unspecified open wound, right lower leg, initial encounter: Secondary | ICD-10-CM | POA: Diagnosis not present

## 2015-09-28 DIAGNOSIS — Z23 Encounter for immunization: Secondary | ICD-10-CM | POA: Diagnosis not present

## 2015-10-02 DIAGNOSIS — S81801D Unspecified open wound, right lower leg, subsequent encounter: Secondary | ICD-10-CM | POA: Diagnosis not present

## 2015-10-02 DIAGNOSIS — S81801A Unspecified open wound, right lower leg, initial encounter: Secondary | ICD-10-CM | POA: Diagnosis not present

## 2015-10-02 DIAGNOSIS — E119 Type 2 diabetes mellitus without complications: Secondary | ICD-10-CM | POA: Diagnosis not present

## 2015-10-05 DIAGNOSIS — S81801A Unspecified open wound, right lower leg, initial encounter: Secondary | ICD-10-CM | POA: Diagnosis not present

## 2015-10-09 DIAGNOSIS — S81801A Unspecified open wound, right lower leg, initial encounter: Secondary | ICD-10-CM | POA: Diagnosis not present

## 2015-10-09 DIAGNOSIS — S81801D Unspecified open wound, right lower leg, subsequent encounter: Secondary | ICD-10-CM | POA: Diagnosis not present

## 2015-10-09 DIAGNOSIS — E119 Type 2 diabetes mellitus without complications: Secondary | ICD-10-CM | POA: Diagnosis not present

## 2015-10-17 DIAGNOSIS — J019 Acute sinusitis, unspecified: Secondary | ICD-10-CM | POA: Diagnosis not present

## 2015-10-17 DIAGNOSIS — H6506 Acute serous otitis media, recurrent, bilateral: Secondary | ICD-10-CM | POA: Diagnosis not present

## 2015-10-17 DIAGNOSIS — R1312 Dysphagia, oropharyngeal phase: Secondary | ICD-10-CM | POA: Diagnosis not present

## 2015-10-17 DIAGNOSIS — B9689 Other specified bacterial agents as the cause of diseases classified elsewhere: Secondary | ICD-10-CM | POA: Diagnosis not present

## 2015-10-25 DIAGNOSIS — D510 Vitamin B12 deficiency anemia due to intrinsic factor deficiency: Secondary | ICD-10-CM | POA: Diagnosis not present

## 2015-10-25 DIAGNOSIS — K219 Gastro-esophageal reflux disease without esophagitis: Secondary | ICD-10-CM | POA: Diagnosis not present

## 2015-11-27 DIAGNOSIS — K219 Gastro-esophageal reflux disease without esophagitis: Secondary | ICD-10-CM | POA: Diagnosis not present

## 2015-11-29 DIAGNOSIS — R935 Abnormal findings on diagnostic imaging of other abdominal regions, including retroperitoneum: Secondary | ICD-10-CM | POA: Diagnosis not present

## 2015-11-29 DIAGNOSIS — K7469 Other cirrhosis of liver: Secondary | ICD-10-CM | POA: Diagnosis not present

## 2015-11-30 DIAGNOSIS — K7469 Other cirrhosis of liver: Secondary | ICD-10-CM | POA: Diagnosis not present

## 2015-11-30 DIAGNOSIS — K7460 Unspecified cirrhosis of liver: Secondary | ICD-10-CM | POA: Diagnosis not present

## 2015-11-30 DIAGNOSIS — K869 Disease of pancreas, unspecified: Secondary | ICD-10-CM | POA: Diagnosis not present

## 2015-11-30 DIAGNOSIS — K8689 Other specified diseases of pancreas: Secondary | ICD-10-CM | POA: Diagnosis not present

## 2015-12-12 DIAGNOSIS — E039 Hypothyroidism, unspecified: Secondary | ICD-10-CM | POA: Diagnosis not present

## 2015-12-12 DIAGNOSIS — K219 Gastro-esophageal reflux disease without esophagitis: Secondary | ICD-10-CM | POA: Diagnosis not present

## 2015-12-12 DIAGNOSIS — E119 Type 2 diabetes mellitus without complications: Secondary | ICD-10-CM | POA: Diagnosis not present

## 2015-12-12 DIAGNOSIS — Z800 Family history of malignant neoplasm of digestive organs: Secondary | ICD-10-CM | POA: Diagnosis not present

## 2015-12-12 DIAGNOSIS — Z8673 Personal history of transient ischemic attack (TIA), and cerebral infarction without residual deficits: Secondary | ICD-10-CM | POA: Diagnosis not present

## 2015-12-12 DIAGNOSIS — K2970 Gastritis, unspecified, without bleeding: Secondary | ICD-10-CM | POA: Diagnosis not present

## 2015-12-12 DIAGNOSIS — R1310 Dysphagia, unspecified: Secondary | ICD-10-CM | POA: Diagnosis not present

## 2015-12-12 DIAGNOSIS — I10 Essential (primary) hypertension: Secondary | ICD-10-CM | POA: Diagnosis not present

## 2015-12-12 DIAGNOSIS — Z79899 Other long term (current) drug therapy: Secondary | ICD-10-CM | POA: Diagnosis not present

## 2015-12-12 DIAGNOSIS — Z9049 Acquired absence of other specified parts of digestive tract: Secondary | ICD-10-CM | POA: Diagnosis not present

## 2015-12-12 DIAGNOSIS — K7460 Unspecified cirrhosis of liver: Secondary | ICD-10-CM | POA: Diagnosis not present

## 2015-12-12 DIAGNOSIS — J45909 Unspecified asthma, uncomplicated: Secondary | ICD-10-CM | POA: Diagnosis not present

## 2015-12-12 DIAGNOSIS — Z7984 Long term (current) use of oral hypoglycemic drugs: Secondary | ICD-10-CM | POA: Diagnosis not present

## 2016-01-02 DIAGNOSIS — Z0000 Encounter for general adult medical examination without abnormal findings: Secondary | ICD-10-CM | POA: Diagnosis not present

## 2016-01-02 DIAGNOSIS — D539 Nutritional anemia, unspecified: Secondary | ICD-10-CM | POA: Diagnosis not present

## 2016-01-02 DIAGNOSIS — E119 Type 2 diabetes mellitus without complications: Secondary | ICD-10-CM | POA: Diagnosis not present

## 2016-01-02 DIAGNOSIS — Z9181 History of falling: Secondary | ICD-10-CM | POA: Diagnosis not present

## 2016-01-02 DIAGNOSIS — Z2821 Immunization not carried out because of patient refusal: Secondary | ICD-10-CM | POA: Diagnosis not present

## 2016-01-02 DIAGNOSIS — Z23 Encounter for immunization: Secondary | ICD-10-CM | POA: Diagnosis not present

## 2016-01-02 DIAGNOSIS — E559 Vitamin D deficiency, unspecified: Secondary | ICD-10-CM | POA: Diagnosis not present

## 2016-01-09 DIAGNOSIS — K7460 Unspecified cirrhosis of liver: Secondary | ICD-10-CM | POA: Diagnosis not present

## 2016-01-09 DIAGNOSIS — Z79899 Other long term (current) drug therapy: Secondary | ICD-10-CM | POA: Diagnosis not present

## 2016-01-12 DIAGNOSIS — D7589 Other specified diseases of blood and blood-forming organs: Secondary | ICD-10-CM | POA: Diagnosis not present

## 2016-01-12 DIAGNOSIS — R05 Cough: Secondary | ICD-10-CM | POA: Diagnosis not present

## 2016-01-17 DIAGNOSIS — K219 Gastro-esophageal reflux disease without esophagitis: Secondary | ICD-10-CM | POA: Diagnosis not present

## 2016-03-22 DIAGNOSIS — J209 Acute bronchitis, unspecified: Secondary | ICD-10-CM | POA: Diagnosis not present

## 2016-03-28 DIAGNOSIS — R310 Gross hematuria: Secondary | ICD-10-CM | POA: Diagnosis not present

## 2016-03-28 DIAGNOSIS — L853 Xerosis cutis: Secondary | ICD-10-CM | POA: Diagnosis not present

## 2016-03-28 DIAGNOSIS — N390 Urinary tract infection, site not specified: Secondary | ICD-10-CM | POA: Diagnosis not present

## 2016-03-28 DIAGNOSIS — J209 Acute bronchitis, unspecified: Secondary | ICD-10-CM | POA: Diagnosis not present

## 2016-05-13 DIAGNOSIS — R310 Gross hematuria: Secondary | ICD-10-CM | POA: Diagnosis not present

## 2016-05-20 DIAGNOSIS — N200 Calculus of kidney: Secondary | ICD-10-CM | POA: Diagnosis not present

## 2016-05-20 DIAGNOSIS — R310 Gross hematuria: Secondary | ICD-10-CM | POA: Diagnosis not present

## 2016-05-20 DIAGNOSIS — K7460 Unspecified cirrhosis of liver: Secondary | ICD-10-CM | POA: Diagnosis not present

## 2016-05-20 DIAGNOSIS — K8689 Other specified diseases of pancreas: Secondary | ICD-10-CM | POA: Diagnosis not present

## 2016-05-28 DIAGNOSIS — R8271 Bacteriuria: Secondary | ICD-10-CM | POA: Diagnosis not present

## 2016-05-28 DIAGNOSIS — N200 Calculus of kidney: Secondary | ICD-10-CM | POA: Diagnosis not present

## 2016-05-28 DIAGNOSIS — R310 Gross hematuria: Secondary | ICD-10-CM | POA: Diagnosis not present

## 2016-05-28 DIAGNOSIS — N281 Cyst of kidney, acquired: Secondary | ICD-10-CM | POA: Diagnosis not present

## 2016-07-17 DIAGNOSIS — K219 Gastro-esophageal reflux disease without esophagitis: Secondary | ICD-10-CM | POA: Diagnosis not present

## 2016-07-19 DIAGNOSIS — K7469 Other cirrhosis of liver: Secondary | ICD-10-CM | POA: Diagnosis not present

## 2016-07-31 DIAGNOSIS — I2510 Atherosclerotic heart disease of native coronary artery without angina pectoris: Secondary | ICD-10-CM | POA: Diagnosis not present

## 2016-07-31 DIAGNOSIS — E785 Hyperlipidemia, unspecified: Secondary | ICD-10-CM | POA: Diagnosis not present

## 2016-07-31 DIAGNOSIS — I5032 Chronic diastolic (congestive) heart failure: Secondary | ICD-10-CM | POA: Diagnosis not present

## 2016-07-31 DIAGNOSIS — I110 Hypertensive heart disease with heart failure: Secondary | ICD-10-CM | POA: Diagnosis not present

## 2016-10-02 DIAGNOSIS — D7589 Other specified diseases of blood and blood-forming organs: Secondary | ICD-10-CM | POA: Diagnosis not present

## 2016-10-02 DIAGNOSIS — E119 Type 2 diabetes mellitus without complications: Secondary | ICD-10-CM | POA: Diagnosis not present

## 2016-10-07 DIAGNOSIS — D696 Thrombocytopenia, unspecified: Secondary | ICD-10-CM | POA: Diagnosis not present

## 2016-11-06 DIAGNOSIS — Z801 Family history of malignant neoplasm of trachea, bronchus and lung: Secondary | ICD-10-CM | POA: Diagnosis not present

## 2016-11-06 DIAGNOSIS — K869 Disease of pancreas, unspecified: Secondary | ICD-10-CM | POA: Diagnosis not present

## 2016-11-06 DIAGNOSIS — I10 Essential (primary) hypertension: Secondary | ICD-10-CM | POA: Diagnosis not present

## 2016-11-06 DIAGNOSIS — K7460 Unspecified cirrhosis of liver: Secondary | ICD-10-CM | POA: Diagnosis not present

## 2016-11-06 DIAGNOSIS — E119 Type 2 diabetes mellitus without complications: Secondary | ICD-10-CM | POA: Diagnosis not present

## 2016-11-06 DIAGNOSIS — D696 Thrombocytopenia, unspecified: Secondary | ICD-10-CM | POA: Diagnosis not present

## 2016-11-06 DIAGNOSIS — J449 Chronic obstructive pulmonary disease, unspecified: Secondary | ICD-10-CM | POA: Diagnosis not present

## 2016-11-06 DIAGNOSIS — R161 Splenomegaly, not elsewhere classified: Secondary | ICD-10-CM | POA: Diagnosis not present

## 2016-11-06 DIAGNOSIS — E039 Hypothyroidism, unspecified: Secondary | ICD-10-CM | POA: Diagnosis not present

## 2016-11-06 DIAGNOSIS — D732 Chronic congestive splenomegaly: Secondary | ICD-10-CM | POA: Diagnosis not present

## 2016-11-06 DIAGNOSIS — D6959 Other secondary thrombocytopenia: Secondary | ICD-10-CM | POA: Diagnosis not present

## 2016-11-06 DIAGNOSIS — Z86010 Personal history of colonic polyps: Secondary | ICD-10-CM | POA: Diagnosis not present

## 2016-11-06 DIAGNOSIS — Z803 Family history of malignant neoplasm of breast: Secondary | ICD-10-CM | POA: Diagnosis not present

## 2016-12-23 DIAGNOSIS — K7469 Other cirrhosis of liver: Secondary | ICD-10-CM | POA: Diagnosis not present

## 2016-12-23 DIAGNOSIS — K219 Gastro-esophageal reflux disease without esophagitis: Secondary | ICD-10-CM | POA: Diagnosis not present

## 2016-12-23 DIAGNOSIS — K869 Disease of pancreas, unspecified: Secondary | ICD-10-CM | POA: Diagnosis not present

## 2016-12-25 DIAGNOSIS — K7460 Unspecified cirrhosis of liver: Secondary | ICD-10-CM | POA: Diagnosis not present

## 2016-12-25 DIAGNOSIS — K7469 Other cirrhosis of liver: Secondary | ICD-10-CM | POA: Diagnosis not present

## 2017-01-02 DIAGNOSIS — Z8041 Family history of malignant neoplasm of ovary: Secondary | ICD-10-CM | POA: Diagnosis not present

## 2017-01-02 DIAGNOSIS — K869 Disease of pancreas, unspecified: Secondary | ICD-10-CM | POA: Diagnosis not present

## 2017-01-02 DIAGNOSIS — Z801 Family history of malignant neoplasm of trachea, bronchus and lung: Secondary | ICD-10-CM | POA: Diagnosis not present

## 2017-01-02 DIAGNOSIS — Z808 Family history of malignant neoplasm of other organs or systems: Secondary | ICD-10-CM | POA: Diagnosis not present

## 2017-01-02 DIAGNOSIS — Z800 Family history of malignant neoplasm of digestive organs: Secondary | ICD-10-CM | POA: Diagnosis not present

## 2017-01-10 DIAGNOSIS — K317 Polyp of stomach and duodenum: Secondary | ICD-10-CM | POA: Diagnosis not present

## 2017-01-10 DIAGNOSIS — Z7982 Long term (current) use of aspirin: Secondary | ICD-10-CM | POA: Diagnosis not present

## 2017-01-10 DIAGNOSIS — K449 Diaphragmatic hernia without obstruction or gangrene: Secondary | ICD-10-CM | POA: Diagnosis not present

## 2017-01-10 DIAGNOSIS — E119 Type 2 diabetes mellitus without complications: Secondary | ICD-10-CM | POA: Diagnosis not present

## 2017-01-10 DIAGNOSIS — D131 Benign neoplasm of stomach: Secondary | ICD-10-CM | POA: Diagnosis not present

## 2017-01-10 DIAGNOSIS — Z800 Family history of malignant neoplasm of digestive organs: Secondary | ICD-10-CM | POA: Diagnosis not present

## 2017-01-10 DIAGNOSIS — Z8673 Personal history of transient ischemic attack (TIA), and cerebral infarction without residual deficits: Secondary | ICD-10-CM | POA: Diagnosis not present

## 2017-01-10 DIAGNOSIS — J45909 Unspecified asthma, uncomplicated: Secondary | ICD-10-CM | POA: Diagnosis not present

## 2017-01-10 DIAGNOSIS — D649 Anemia, unspecified: Secondary | ICD-10-CM | POA: Diagnosis not present

## 2017-01-10 DIAGNOSIS — Z86010 Personal history of colonic polyps: Secondary | ICD-10-CM | POA: Diagnosis not present

## 2017-01-10 DIAGNOSIS — K7460 Unspecified cirrhosis of liver: Secondary | ICD-10-CM | POA: Diagnosis not present

## 2017-01-10 DIAGNOSIS — I10 Essential (primary) hypertension: Secondary | ICD-10-CM | POA: Diagnosis not present

## 2017-01-10 DIAGNOSIS — Z9049 Acquired absence of other specified parts of digestive tract: Secondary | ICD-10-CM | POA: Diagnosis not present

## 2017-01-10 DIAGNOSIS — Z79899 Other long term (current) drug therapy: Secondary | ICD-10-CM | POA: Diagnosis not present

## 2017-01-10 DIAGNOSIS — D124 Benign neoplasm of descending colon: Secondary | ICD-10-CM | POA: Diagnosis not present

## 2017-04-23 DIAGNOSIS — R5383 Other fatigue: Secondary | ICD-10-CM | POA: Diagnosis not present

## 2017-04-23 DIAGNOSIS — E559 Vitamin D deficiency, unspecified: Secondary | ICD-10-CM | POA: Diagnosis not present

## 2017-04-23 DIAGNOSIS — R7302 Impaired glucose tolerance (oral): Secondary | ICD-10-CM | POA: Diagnosis not present

## 2017-04-23 DIAGNOSIS — R42 Dizziness and giddiness: Secondary | ICD-10-CM | POA: Diagnosis not present

## 2017-04-23 DIAGNOSIS — Z0000 Encounter for general adult medical examination without abnormal findings: Secondary | ICD-10-CM | POA: Diagnosis not present

## 2017-04-23 DIAGNOSIS — Z79899 Other long term (current) drug therapy: Secondary | ICD-10-CM | POA: Diagnosis not present

## 2017-04-23 DIAGNOSIS — I10 Essential (primary) hypertension: Secondary | ICD-10-CM | POA: Diagnosis not present

## 2017-04-23 DIAGNOSIS — E785 Hyperlipidemia, unspecified: Secondary | ICD-10-CM | POA: Diagnosis not present

## 2017-04-23 DIAGNOSIS — D539 Nutritional anemia, unspecified: Secondary | ICD-10-CM | POA: Diagnosis not present

## 2017-04-23 DIAGNOSIS — E039 Hypothyroidism, unspecified: Secondary | ICD-10-CM | POA: Diagnosis not present

## 2017-06-03 DIAGNOSIS — E039 Hypothyroidism, unspecified: Secondary | ICD-10-CM | POA: Diagnosis not present

## 2017-07-17 DIAGNOSIS — E1143 Type 2 diabetes mellitus with diabetic autonomic (poly)neuropathy: Secondary | ICD-10-CM | POA: Diagnosis not present

## 2017-07-17 DIAGNOSIS — D696 Thrombocytopenia, unspecified: Secondary | ICD-10-CM | POA: Diagnosis not present

## 2017-07-17 DIAGNOSIS — E039 Hypothyroidism, unspecified: Secondary | ICD-10-CM | POA: Diagnosis not present

## 2017-07-17 DIAGNOSIS — L299 Pruritus, unspecified: Secondary | ICD-10-CM | POA: Diagnosis not present

## 2017-07-17 DIAGNOSIS — J302 Other seasonal allergic rhinitis: Secondary | ICD-10-CM | POA: Diagnosis not present

## 2017-07-28 DIAGNOSIS — K7469 Other cirrhosis of liver: Secondary | ICD-10-CM | POA: Diagnosis not present

## 2017-08-08 DIAGNOSIS — Z1331 Encounter for screening for depression: Secondary | ICD-10-CM | POA: Diagnosis not present

## 2017-08-08 DIAGNOSIS — E1143 Type 2 diabetes mellitus with diabetic autonomic (poly)neuropathy: Secondary | ICD-10-CM | POA: Diagnosis not present

## 2017-08-08 DIAGNOSIS — Z9181 History of falling: Secondary | ICD-10-CM | POA: Diagnosis not present

## 2017-08-08 DIAGNOSIS — Z01419 Encounter for gynecological examination (general) (routine) without abnormal findings: Secondary | ICD-10-CM | POA: Diagnosis not present

## 2017-08-21 DIAGNOSIS — Z1231 Encounter for screening mammogram for malignant neoplasm of breast: Secondary | ICD-10-CM | POA: Diagnosis not present

## 2017-08-27 DIAGNOSIS — D696 Thrombocytopenia, unspecified: Secondary | ICD-10-CM | POA: Diagnosis not present

## 2017-09-05 DIAGNOSIS — L820 Inflamed seborrheic keratosis: Secondary | ICD-10-CM | POA: Diagnosis not present

## 2017-09-05 DIAGNOSIS — L578 Other skin changes due to chronic exposure to nonionizing radiation: Secondary | ICD-10-CM | POA: Diagnosis not present

## 2017-09-05 DIAGNOSIS — L219 Seborrheic dermatitis, unspecified: Secondary | ICD-10-CM | POA: Diagnosis not present

## 2017-09-05 DIAGNOSIS — D225 Melanocytic nevi of trunk: Secondary | ICD-10-CM | POA: Diagnosis not present

## 2017-09-05 DIAGNOSIS — R233 Spontaneous ecchymoses: Secondary | ICD-10-CM | POA: Diagnosis not present

## 2017-09-12 DIAGNOSIS — I110 Hypertensive heart disease with heart failure: Secondary | ICD-10-CM | POA: Diagnosis not present

## 2017-09-12 DIAGNOSIS — L03116 Cellulitis of left lower limb: Secondary | ICD-10-CM | POA: Diagnosis not present

## 2017-09-12 DIAGNOSIS — I509 Heart failure, unspecified: Secondary | ICD-10-CM | POA: Diagnosis not present

## 2017-09-12 DIAGNOSIS — I2510 Atherosclerotic heart disease of native coronary artery without angina pectoris: Secondary | ICD-10-CM | POA: Diagnosis not present

## 2017-09-12 DIAGNOSIS — Z8673 Personal history of transient ischemic attack (TIA), and cerebral infarction without residual deficits: Secondary | ICD-10-CM | POA: Diagnosis not present

## 2017-09-12 DIAGNOSIS — R0603 Acute respiratory distress: Secondary | ICD-10-CM | POA: Diagnosis not present

## 2017-09-12 DIAGNOSIS — I252 Old myocardial infarction: Secondary | ICD-10-CM | POA: Diagnosis not present

## 2017-09-17 DIAGNOSIS — L03119 Cellulitis of unspecified part of limb: Secondary | ICD-10-CM | POA: Diagnosis not present

## 2017-09-17 DIAGNOSIS — D696 Thrombocytopenia, unspecified: Secondary | ICD-10-CM | POA: Diagnosis not present

## 2017-09-17 DIAGNOSIS — R945 Abnormal results of liver function studies: Secondary | ICD-10-CM | POA: Diagnosis not present

## 2017-10-01 DIAGNOSIS — K7469 Other cirrhosis of liver: Secondary | ICD-10-CM | POA: Diagnosis not present

## 2017-10-13 NOTE — Progress Notes (Signed)
Cardiology Office Note:    Date:  10/14/2017   ID:  Casey Humphrey, DOB 1945/11/03, MRN [id number]  PCP:  Angelina Sheriff, MD  Cardiologist:  Shirlee More, MD   Referring MD: Angelina Sheriff, MD  ASSESSMENT:    1. Hypertensive heart disease with heart failure ([place])   2. Mild CAD   3. Type 2 diabetes mellitus with complication, with long-term current use of insulin (HCC)   4. Chest pain, unspecified type    PLAN:    In order of problems listed above:  1. Heart failure is decompensated she is fluid overloaded New York Heart Association class III.  She will discontinue furosemide take a larger dose of torsemide return Monday to check BMP proBNP and follow-up in the office in several weeks.  She will continue to weigh daily sodium restrict and encourage compliance with meds 2. Stable she is having nonanginal chest pain I do not think she requires a repeat ischemia evaluation 3. Stable managed by her PCP 4. Atypical musculoskeletal pain  Next appointment in 2 to 3 weeks   Medication Adjustments/Labs and Tests Ordered: Current medicines are reviewed at length with the patient today.  Concerns regarding medicines are outlined above.  Orders Placed This Encounter  Procedures  . Basic Metabolic Panel (BMET)  . Pro b natriuretic peptide (BNP)  . EKG 12-Lead   Meds ordered this encounter  Medications  . torsemide (DEMADEX) tablet 20 mg     Chief Complaint  Patient presents with  . Follow-up    last seen at [facility]  . Congestive Heart Failure    History of Present Illness:    Casey ARSENEAU is a 72 y.o. female who is being seen today for the evaluation of heart failure at the request of the patient Family history and examination in epic 04/22/2014 she has a history of heart failure hypertension CAD angina previous myocardial infarction type 2 diabetes stroke and hyper thyroidism.  There are no cardiology records available in epic or care everywhere and has been greater than  3 years since she was last seen by cardiology  She was seen at [facility] emergency room 09/12/2017 she has chronic edema she had abraded her leg and she had weeping of fluid.  Her evaluation in the emergency room showed a normal CBC she had a normal BMP chest x-ray was unremarkable her final diagnosis was cellulitis and she had her diuretic increased and was advised to follow-up with her primary care physician.  As part of that evaluation a proBNP level was checked and was found to be low at 254 EKG at the time of the emergency room visit showed sinus rhythm and was normal.  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  EKG in office is normal she has had no angina palpitation or syncope  Past Medical History:  Diagnosis Date  . Anginal pain (Casey Humphrey)    takes nitro approx once per month  . Anxiety   . Arthritis   . Asthma    occ. Bronchial problems  . Bleeder's disease    "  free Bleeder"  . CHF (congestive heart failure) (Casey Humphrey)   . Coronary artery disease   . Diabetes mellitus without complication (Casey Humphrey)   . Family history of adverse reaction to anesthesia    sister has n/v  . Frequency of urination   . GERD (gastroesophageal reflux disease)   . History of kidney stones   . Hypertension   . Hyperthyroidism   . Meniscus tear    left knee  . Myocardial infarction [facility]) 1980's   " it was a mild one"  . Neuromuscular disorder (HCC)    diabetic neuropathy feet  . Nocturia   . Shortness of breath dyspnea    with exertion  . Stroke [facility])    left leg affected'14-no problems now    Past Surgical History:    Procedure Laterality Date  . ABDOMINAL HYSTERECTOMY    . CARDIAC CATHETERIZATION     8'14-[facility]  . CATARACT EXTRACTION, BILATERAL Bilateral   . CHOLECYSTECTOMY     open  . CHONDROPLASTY Left 04/27/2014   Procedure: abrasion CHONDROPLASTY of medial femoral condyle, abrasion chondroplasty of patella;  Surgeon: Tobi Bastos, MD;  Location: [facility];  Service: Orthopedics;  Laterality: Left;  . COLONOSCOPY     polyp removed  . EUS N/A 04/08/2013   Procedure: UPPER ENDOSCOPIC ULTRASOUND (EUS) LINEAR;  Surgeon: Milus Banister, MD;  Location: [facility];  Service: Endoscopy;  Laterality: N/A;  . KNEE ARTHROSCOPY WITH LATERAL MENISECTOMY Left 04/27/2014   Procedure: LEFT KNEE ARTHROSCOPY WITH  medial and LATERAL MENISECTOMY.;  Surgeon: Tobi Bastos, MD;  Location: [facility];  Service: Orthopedics;  Laterality: Left;    Current Medications: Current Meds  Medication Sig  . aspirin EC 81 MG tablet Take 81 mg by mouth daily.  Casey Humphrey HYDROcodone-acetaminophen (NORCO/VICODIN) 5-325 MG tablet Take 1 tablet by mouth every 6 (six) hours as needed for moderate pain.  Casey Humphrey ipratropium (ATROVENT HFA) 17 MCG/ACT inhaler Inhale 2 puffs into the lungs every 6 (six) hours.  Casey Humphrey levothyroxine (SYNTHROID, LEVOTHROID) 88 MCG tablet Take 88 mcg by mouth daily before breakfast.  . losartan (COZAAR) 25 MG tablet Take 25 mg by mouth every morning.  . metFORMIN (GLUCOPHAGE-XR) 500 MG 24 hr tablet Take 500 mg by mouth daily with breakfast.  . Methylcellulose, Laxative, (CITRUCEL PO) Take 1 capsule by mouth daily.  . propranolol (INDERAL) 20 MG tablet Take 20 mg by mouth 2 (two) times daily.  . [DISCONTINUED] furosemide (LASIX) 20 MG tablet Take 20 mg by mouth every morning.   Current Facility-Administered Medications for the 10/14/17 encounter (Office Visit) with Richardo Priest, MD  Medication  . torsemide (DEMADEX) tablet 20 mg     Allergies:   Codeine; Quinine derivatives; Adhesive [tape]; Penicillins;  and Procardia [nifedipine]   Social History   Socioeconomic History  . Marital status: Widowed    Spouse name: Not on file  . Number of children: Not on file  . Years of education: Not on file  . Highest education level: Not on file  Occupational History  . Not on file  Social Needs  . Financial resource strain: Not on file  . Food insecurity:    Worry: Not on file    Inability: Not on file  . Transportation needs:    Medical: Not on file    Non-medical: Not on file  Tobacco Use  . Smoking status: Never Smoker  . Smokeless tobacco: Never Used  Substance and Sexual Activity  . Alcohol use: No  .  Drug use: No  . Sexual activity: Not Currently  Lifestyle  . Physical activity:    Days per week: Not on file    Minutes per session: Not on file  . Stress: Not on file  Relationships  . Social connections:    Talks on phone: Not on file    Gets together: Not on file    Attends religious service: Not on file    Active member of club or organization: Not on file    Attends meetings of clubs or organizations: Not on file    Relationship status: Not on file  Other Topics Concern  . Not on file  Social History Narrative  . Not on file     Family History: The patient's family history includes CAD in her sister.  ROS:   Review of Systems  Constitution: Positive for malaise/fatigue.  HENT: Negative.   Eyes: Negative.   Cardiovascular: Positive for chest pain, dyspnea on exertion and leg swelling.  Respiratory: Positive for shortness of breath.   Endocrine: Negative.   Hematologic/Lymphatic: Negative.   Skin: Negative.   Musculoskeletal: Negative.   Gastrointestinal: Negative.   Genitourinary: Negative.   Neurological: Negative.   Psychiatric/Behavioral: Negative.   Allergic/Immunologic: Negative.    Please see the history of present illness.     All other systems reviewed and are negative.  EKGs/Labs/Other Studies Reviewed:    The following studies were reviewed  today:   EKG:  EKG is  ordered today.  The ekg ordered today demonstrates sinus rhythm normal  Recent Labs: No results found for requested labs within last 8760 hours.  Recent Lipid Panel No results found for: CHOL, TRIG, HDL, CHOLHDL, VLDL, LDLCALC, LDLDIRECT  Physical Exam:    VS:  BP 136/84 (BP Location: Left Arm, Patient Position: Sitting, Cuff Size: Normal)   Pulse 67   Wt 172 lb (78 kg)   SpO2 96%   BMI 32.50 kg/m     Wt Readings from Last 3 Encounters:  10/14/17 172 lb (78 kg)  04/27/14 181 lb (82.1 kg)  04/22/14 181 lb (82.1 kg)     GEN:  Well nourished, well developed in no acute distress HEENT: Normal NECK: No JVD; No carotid bruits LYMPHATICS: No lymphadenopathy CARDIAC: Third heart sound RRR, no murmurs, rubs, gallops RESPIRATORY:  Clear to auscultation without rales, wheezing or rhonchi  ABDOMEN: Soft, non-tender, non-distended MUSCULOSKELETAL: 3-4+ to the knee bilateral edema; No deformity  SKIN: Warm and dry NEUROLOGIC:  Alert and oriented x 3 PSYCHIATRIC:  Normal affect     Signed, Shirlee More, MD  10/14/2017 5:19 PM    [place]

## 2017-10-14 ENCOUNTER — Ambulatory Visit: Payer: Medicare Other | Admitting: Cardiology

## 2017-10-14 ENCOUNTER — Encounter: Payer: Self-pay | Admitting: Cardiology

## 2017-10-14 DIAGNOSIS — E119 Type 2 diabetes mellitus without complications: Secondary | ICD-10-CM | POA: Insufficient documentation

## 2017-10-14 DIAGNOSIS — I110 Hypertensive heart disease with heart failure: Secondary | ICD-10-CM

## 2017-10-14 DIAGNOSIS — Z794 Long term (current) use of insulin: Secondary | ICD-10-CM

## 2017-10-14 DIAGNOSIS — I2510 Atherosclerotic heart disease of native coronary artery without angina pectoris: Secondary | ICD-10-CM | POA: Insufficient documentation

## 2017-10-14 DIAGNOSIS — R079 Chest pain, unspecified: Secondary | ICD-10-CM | POA: Diagnosis not present

## 2017-10-14 DIAGNOSIS — E118 Type 2 diabetes mellitus with unspecified complications: Secondary | ICD-10-CM

## 2017-10-14 MED ORDER — TORSEMIDE 5 MG PO TABS: 20.0000 mg | ORAL_TABLET | Freq: Two times a day (BID) | ORAL | Status: DC

## 2017-10-14 NOTE — Patient Instructions (Signed)
Medication Instructions:  Your physician has recommended you make the following change in your medication:  STOP: Furosemide  START: Torsemide 20 mg BID    Labwork: Your physician recommends that you return for lab work in: BMP, BNP    Testing/Procedures: None  Follow-Up: Your physician recommends that you schedule a follow-up appointment in: 3 weeks   Any Other Special Instructions Will Be Listed Below (If Applicable).     If you need a refill on your cardiac medications before your next appointment, please call your pharmacy.

## 2017-10-15 DIAGNOSIS — I110 Hypertensive heart disease with heart failure: Secondary | ICD-10-CM | POA: Diagnosis not present

## 2017-10-15 LAB — BASIC METABOLIC PANEL
BUN/Creatinine Ratio: 14 (ref 12–28)
BUN: 10 mg/dL (ref 8–27)
CO2: 24 mmol/L (ref 20–29)
Calcium: 9.6 mg/dL (ref 8.7–10.3)
Chloride: 104 mmol/L (ref 96–106)
Creatinine, Ser: 0.72 mg/dL (ref 0.57–1.00)
GFR calc Af Amer: 95 mL/min/{1.73_m2} (ref >59)
GFR calc non Af Amer: 83 mL/min/{1.73_m2} (ref >59)
Glucose: 141 mg/dL — ABNORMAL HIGH (ref 65–99)
Potassium: 4.6 mmol/L (ref 3.5–5.2)
Sodium: 140 mmol/L (ref 134–144)

## 2017-10-15 LAB — PRO B NATRIURETIC PEPTIDE: NT-Pro BNP: 350 pg/mL — ABNORMAL HIGH (ref 0–301)

## 2017-10-15 MED ORDER — TORSEMIDE 20 MG PO TABS: 20.0000 mg | ORAL_TABLET | Freq: Two times a day (BID) | ORAL | 3 refills | Status: DC

## 2017-10-15 NOTE — Addendum Note (Signed)
Addended by: Ashok Norris on: 10/15/2017 09:34 AM   Modules accepted: Orders

## 2017-11-03 NOTE — Progress Notes (Signed)
Cardiology Office Note:    Date:  11/04/2017   ID:  DAZIA LIPPOLD, DOB [birth year], MRN [id number]  PCP:  Angelina Sheriff, MD  Cardiologist:  Shirlee More, MD    Referring MD: Angelina Sheriff, MD    ASSESSMENT:    1. Hypertensive heart disease with heart failure ([place])   2. Mild CAD   3. Type 2 diabetes mellitus with complication, with long-term current use of insulin ([place])   4. Cold extremities   5. Costochondral chest pain    PLAN:    In order of problems listed above:  1. Improved she has cleared her edema she has mild exertional shortness of breath New York Heart Association class II we will continue her current diuretic.  She is at risk for hypokalemia renal insufficiency check BMP along with a proBNP for heart failure.  With the lightheadedness and a relatively low blood pressure we will stop her ARB. 2. Stable continue medical treatment 3. Stable managed by her PCP 4. At risk for iron deficiency with heart failure check ferritin and iron studies. 5. Improved   Next appointment: 6 months   Medication Adjustments/Labs and Tests Ordered: Current medicines are reviewed at length with the patient today.  Concerns regarding medicines are outlined above.  Orders Placed This Encounter  Procedures  . Fe+TIBC+Fer  . CBC  . Basic Metabolic Panel (BMET)  . Pro b natriuretic peptide (BNP)   No orders of the defined types were placed in this encounter.   Chief Complaint  Patient presents with  . Follow-up  . Chest Pain  . Congestive Heart Failure    History of Present Illness:    BELLATRIX DEVONSHIRE is a 72 y.o. female with a hx of heart failure hypertension CAD angina previous myocardial infarction type 2 diabetes stroke and hyper thyroidism. last seen 10/14/17 with decompensated heart failure. Compliance with diet, lifestyle and medications: yes  She is better by our scale her weight is down 5 pounds and she is clear to her edema she still has mild exertional  dyspnea but no longer has orthopnea and her costochondral chest pain has improved.  She has had no angina palpitation or syncope.  She complains bitterly of cold extremities and is at risk for iron deficiency and will check iron studies and CBC at her request Past Medical History:  Diagnosis Date  . Anginal pain ([place])    takes nitro approx once per month  . Anxiety   . Arthritis   . Asthma    occ. Bronchial problems  . Bleeder's disease    "free Bleeder"  . CHF (congestive heart failure) ([place])   . Coronary artery disease   . Diabetes mellitus without complication ([place])   . Family history of adverse reaction to anesthesia    sister has n/v  . Frequency of urination   . GERD (gastroesophageal reflux disease)   . History of kidney stones   . Hypertension   . Hyperthyroidism   . Meniscus tear    left knee  . Myocardial infarction [facility]) 1980's   " it was a mild one"  . Neuromuscular disorder (HCC)    diabetic neuropathy feet  . Nocturia   . Shortness of breath dyspnea    with exertion  . Stroke [facility])    left leg affected'14-no problems now    Past Surgical History:  Procedure Laterality Date  . ABDOMINAL HYSTERECTOMY    . CARDIAC CATHETERIZATION     8'14-[facility]  . CATARACT EXTRACTION, BILATERAL Bilateral   . CHOLECYSTECTOMY     open  . CHONDROPLASTY Left 04/27/2014   Procedure: abrasion CHONDROPLASTY of medial femoral condyle, abrasion chondroplasty of patella;  Surgeon: Tobi Bastos, MD;  Location[facility];  Service: Orthopedics;  Laterality: Left;  . COLONOSCOPY     polyp removed  . EUS N/A 04/08/2013   Procedure: UPPER ENDOSCOPIC ULTRASOUND (EUS) LINEAR;  Surgeon: Milus Banister, MD;  [facility];  Service: Endoscopy;  Laterality: N/A;  . KNEE ARTHROSCOPY WITH LATERAL MENISECTOMY Left 04/27/2014   Procedure: LEFT KNEE ARTHROSCOPY WITH  medial and LATERAL MENISECTOMY.;  Surgeon: Tobi Bastos, MD;  Location[facility];  Service: Orthopedics;   Laterality: Left;    Current Medications: Current Meds  Medication Sig  . aspirin EC 81 MG tablet Take 81 mg by mouth daily.  Marland Kitchen HYDROcodone-acetaminophen (NORCO/VICODIN) 5-325 MG tablet Take 1 tablet by mouth every 6 (six) hours as needed for moderate pain.  Marland Kitchen ipratropium (ATROVENT HFA) 17 MCG/ACT inhaler Inhale 2 puffs into the lungs every 6 (six) hours.  Marland Kitchen levothyroxine (SYNTHROID, LEVOTHROID) 88 MCG tablet Take 88 mcg by mouth daily before breakfast.  . metFORMIN (GLUCOPHAGE-XR) 500 MG 24 hr tablet Take 500 mg by mouth daily with breakfast.  . Methylcellulose, Laxative, (CITRUCEL PO) Take 1 capsule by mouth daily.  . metoprolol (LOPRESSOR) 50 MG tablet Take 50 mg by mouth 2 (two) times daily.  . polycarbophil (FIBERCON) 625 MG tablet Take 625 mg by mouth daily.  . ranitidine (ZANTAC) 150 MG tablet Take 150 mg by mouth every morning.  . sertraline (ZOLOFT) 25 MG tablet Take 25 mg by mouth daily.  Marland Kitchen torsemide (DEMADEX) 20 MG tablet Take 1 tablet (20 mg total) by mouth 2 (two) times daily.  . [DISCONTINUED] LORazepam (ATIVAN) 1 MG tablet Take 0.5 mg by mouth daily as needed for anxiety.  . [DISCONTINUED] losartan (COZAAR) 25 MG tablet Take 25 mg by mouth every morning.     Allergies:   Codeine; Quinine derivatives; Adhesive [tape]; Penicillins; and Procardia [nifedipine]   Social History   Socioeconomic History  . Marital status: Widowed    Spouse name: Not on file  . Number of children: Not on file  . Years of education: Not on file  . Highest education level: Not on file  Occupational History  . Not on file  Social Needs  . Financial resource strain: Not on file  . Food insecurity:    Worry: Not on file    Inability: Not on file  . Transportation needs:    Medical: Not on file    Non-medical: Not on file  Tobacco Use  . Smoking status: Never Smoker  . Smokeless tobacco: Never Used  Substance and Sexual Activity  . Alcohol use: No  . Drug use: No  . Sexual activity:  Not Currently  Lifestyle  . Physical activity:    Days per week: Not on file    Minutes per session: Not on file  . Stress: Not on file  Relationships  . Social connections:    Talks on phone: Not on file    Gets together: Not on file    Attends religious service: Not on file    Active member of club or organization: Not on file    Attends meetings of clubs or organizations: Not on file    Relationship status: Not on file  Other Topics Concern  . Not on file  Social  History Narrative  . Not on file     Family History: The patient's family history includes CAD in her sister. ROS:   Please see the history of present illness.    All other systems reviewed and are negative.  EKGs/Labs/Other Studies Reviewed:    The following studies were reviewed today:  Recent Labs: 10/15/2017: BUN 10; Creatinine, Ser 0.72; NT-Pro BNP 350; Potassium 4.6; Sodium 140  Recent Lipid Panel No results found for: CHOL, TRIG, HDL, CHOLHDL, VLDL, LDLCALC, LDLDIRECT  Physical Exam:    VS:  BP (!) 124/58 (BP Location: Left Arm, Patient Position: Sitting, Cuff Size: Normal)   Pulse 66   Ht 5\' 2"  (1.575 m)   Wt 167 lb 9.6 oz (76 kg)   SpO2 98%   BMI 30.65 kg/m     Wt Readings from Last 3 Encounters:  11/04/17 167 lb 9.6 oz (76 kg)  10/14/17 172 lb (78 kg)  04/27/14 181 lb (82.1 kg)     GEN:  Well nourished, well developed in no acute distress HEENT: Normal NECK: No JVD; No carotid bruits LYMPHATICS: No lymphadenopathy CARDIAC: RRR, no murmurs, rubs, gallops  CCJ tenderness RESPIRATORY:  Clear to auscultation without rales, wheezing or rhonchi  ABDOMEN: Soft, non-tender, non-distended MUSCULOSKELETAL:  1+ edema  edema; No deformity  SKIN: Warm and dry NEUROLOGIC:  Alert and oriented x 3 PSYCHIATRIC:  Normal affect    Signed, Shirlee More, MD  11/04/2017 11:38 AM    [facility]

## 2017-11-04 ENCOUNTER — Ambulatory Visit: Payer: Medicare Other | Admitting: Cardiology

## 2017-11-04 ENCOUNTER — Encounter: Payer: Self-pay | Admitting: Cardiology

## 2017-11-04 VITALS — BP 124/58 | HR 66 | Ht 62.0 in | Wt 167.6 lb

## 2017-11-04 DIAGNOSIS — R6889 Other general symptoms and signs: Secondary | ICD-10-CM

## 2017-11-04 DIAGNOSIS — R0789 Other chest pain: Secondary | ICD-10-CM

## 2017-11-04 DIAGNOSIS — R071 Chest pain on breathing: Secondary | ICD-10-CM | POA: Diagnosis not present

## 2017-11-04 DIAGNOSIS — I110 Hypertensive heart disease with heart failure: Secondary | ICD-10-CM | POA: Diagnosis not present

## 2017-11-04 DIAGNOSIS — E118 Type 2 diabetes mellitus with unspecified complications: Secondary | ICD-10-CM | POA: Diagnosis not present

## 2017-11-04 DIAGNOSIS — I2510 Atherosclerotic heart disease of native coronary artery without angina pectoris: Secondary | ICD-10-CM | POA: Diagnosis not present

## 2017-11-04 DIAGNOSIS — R209 Unspecified disturbances of skin sensation: Secondary | ICD-10-CM

## 2017-11-04 DIAGNOSIS — Z794 Long term (current) use of insulin: Secondary | ICD-10-CM

## 2017-11-04 LAB — BASIC METABOLIC PANEL
BUN/Creatinine Ratio: 15 (ref 12–28)
BUN: 12 mg/dL (ref 8–27)
CO2: 21 mmol/L (ref 20–29)
Calcium: 10.3 mg/dL (ref 8.7–10.3)
Chloride: 105 mmol/L (ref 96–106)
Creatinine, Ser: 0.81 mg/dL (ref 0.57–1.00)
GFR calc Af Amer: 83 mL/min/{1.73_m2} (ref >59)
GFR calc non Af Amer: 72 mL/min/{1.73_m2} (ref >59)
Glucose: 171 mg/dL — ABNORMAL HIGH (ref 65–99)
Potassium: 4.4 mmol/L (ref 3.5–5.2)
Sodium: 140 mmol/L (ref 134–144)

## 2017-11-04 LAB — CBC
Hematocrit: 36.1 % (ref 34.0–46.6)
Hemoglobin: 12.1 g/dL (ref 11.1–15.9)
MCH: 35.7 pg — ABNORMAL HIGH (ref 26.6–33.0)
MCHC: 33.5 g/dL (ref 31.5–35.7)
MCV: 107 fL — ABNORMAL HIGH (ref 79–97)
Platelets: 112 10*3/uL — ABNORMAL LOW (ref 150–450)
RBC: 3.39 x10E6/uL — ABNORMAL LOW (ref 3.77–5.28)
RDW: 13.1 % (ref 12.3–15.4)
WBC: 5.3 10*3/uL (ref 3.4–10.8)

## 2017-11-04 LAB — IRON,TIBC AND FERRITIN PANEL
Ferritin: 312 ng/mL — ABNORMAL HIGH (ref 15–150)
Iron Saturation: 52 % (ref 15–55)
Iron: 152 ug/dL — ABNORMAL HIGH (ref 27–139)
Total Iron Binding Capacity: 292 ug/dL (ref 250–450)
UIBC: 140 ug/dL (ref 118–369)

## 2017-11-04 LAB — PRO B NATRIURETIC PEPTIDE: NT-Pro BNP: 255 pg/mL (ref 0–301)

## 2017-11-04 NOTE — Patient Instructions (Signed)
Medication Instructions:  Your physician has recommended you make the following change in your medication:  Stop: Losartan.   Labwork: Your physician recommends that you return for lab work today: Bmp, BNP, CBC, and Fer TIBC  Testing/Procedures: None.  Follow-Up: Your physician recommends that you schedule a follow-up appointment in: 3 months    Any Other Special Instructions Will Be Listed Below (If Applicable).     If you need a refill on your cardiac medications before your next appointment, please call your pharmacy.

## 2017-12-22 DIAGNOSIS — E039 Hypothyroidism, unspecified: Secondary | ICD-10-CM | POA: Diagnosis not present

## 2017-12-22 DIAGNOSIS — E1143 Type 2 diabetes mellitus with diabetic autonomic (poly)neuropathy: Secondary | ICD-10-CM | POA: Diagnosis not present

## 2017-12-22 DIAGNOSIS — I872 Venous insufficiency (chronic) (peripheral): Secondary | ICD-10-CM | POA: Diagnosis not present

## 2017-12-24 ENCOUNTER — Other Ambulatory Visit: Payer: Self-pay

## 2017-12-24 NOTE — Patient Outreach (Signed)
[facility]) Care Management  12/24/2017  Casey Humphrey January 29, 1946 [id number]   Medication Adherence call to Casey Humphrey left a message for patient to call back patient is due on Metformin Er 500 mg under [name].   [place] Management Direct Dial [phone]  Fax [phone] [email or web address]

## 2018-01-01 DIAGNOSIS — I10 Essential (primary) hypertension: Secondary | ICD-10-CM | POA: Diagnosis not present

## 2018-01-28 ENCOUNTER — Encounter: Payer: Self-pay | Admitting: Cardiology

## 2018-01-28 ENCOUNTER — Ambulatory Visit (INDEPENDENT_AMBULATORY_CARE_PROVIDER_SITE_OTHER): Payer: Medicare Other | Admitting: Cardiology

## 2018-01-28 VITALS — BP 132/64 | HR 73 | Ht 62.0 in | Wt 173.6 lb

## 2018-01-28 DIAGNOSIS — I2510 Atherosclerotic heart disease of native coronary artery without angina pectoris: Secondary | ICD-10-CM

## 2018-01-28 DIAGNOSIS — J45909 Unspecified asthma, uncomplicated: Secondary | ICD-10-CM | POA: Insufficient documentation

## 2018-01-28 DIAGNOSIS — I110 Hypertensive heart disease with heart failure: Secondary | ICD-10-CM

## 2018-01-28 MED ORDER — TORSEMIDE 20 MG PO TABS: 20.0000 mg | ORAL_TABLET | Freq: Two times a day (BID) | ORAL | 1 refills | Status: DC

## 2018-01-28 MED ORDER — ALBUTEROL SULFATE HFA 108 (90 BASE) MCG/ACT IN AERS: 2.0000 | INHALATION_SPRAY | Freq: Four times a day (QID) | RESPIRATORY_TRACT | 2 refills | Status: AC | PRN

## 2018-01-28 NOTE — Progress Notes (Signed)
Cardiology Office Note:    Date:  01/28/2018   ID:  ZOANNE NEWILL, DOB [birth year], MRN [id number]  PCP:  Angelina Sheriff, MD  Cardiologist:  Shirlee More, MD    Referring MD: Angelina Sheriff, MD    ASSESSMENT:    1. Hypertensive heart disease with heart failure ([place])   2. Asthma, unspecified asthma severity, unspecified whether complicated, unspecified whether persistent   3. Mild CAD    PLAN:    In order of problems listed above:  1. For heart failure is stable compensators New York Heart Association class I to class II she will continue current loop and distal diuretic.  Blood pressure target continue beta-blocker. 2. She is having more cough and wheezing she is out of her bronchodilator and I renewed her request 3. Stable asymptomatic at this time I do not think she requires an ischemia evaluation.   Next appointment: 6 months   Medication Adjustments/Labs and Tests Ordered: Current medicines are reviewed at length with the patient today.  Concerns regarding medicines are outlined above.  Orders Placed This Encounter  Procedures  . EKG 12-Lead   Meds ordered this encounter  Medications  . albuterol (PROVENTIL HFA;VENTOLIN HFA) 108 (90 Base) MCG/ACT inhaler    Sig: Inhale 2 puffs into the lungs every 6 (six) hours as needed for wheezing or shortness of breath.    Dispense:  1 Inhaler    Refill:  2    No chief complaint on file.   History of Present Illness:    Casey Humphrey is a 72 y.o. female with a hx of heart failure hypertension CAD angina previous myocardial infarction type 2 diabetes stroke and hyper thyroidism  last seen 11/04/2017.  At that time her recently decompensated heart failure is improved and treatment proBNP level had fallen approximately 50% Compliance with diet, lifestyle and medications: Yes  She is been having cough wheezing and her bronchodilator has run out.  No chest pain edema orthopnea palpitation. Past Medical History:    Diagnosis Date  . Anginal pain ([place])    takes nitro approx once per month  . Anxiety   . Arthritis   . Asthma    occ. Bronchial problems  . Bleeder's disease    "free Bleeder"  . CHF (congestive heart failure) ([place])   . Coronary artery disease   . Diabetes mellitus without complication ([place])   . Family history of adverse reaction to anesthesia    sister has n/v  . Frequency of urination   . GERD (gastroesophageal reflux disease)   . History of kidney stones   . Hypertension   . Hyperthyroidism   . Meniscus tear    left knee  . Myocardial infarction [facility]) 1980's   " it was a mild one"  . Neuromuscular disorder (HCC)    diabetic neuropathy feet  . Nocturia   . Shortness of breath dyspnea    with exertion  . Stroke [facility])    left leg affected'14-no problems now    Past Surgical History:  Procedure Laterality Date  . ABDOMINAL HYSTERECTOMY    . CARDIAC CATHETERIZATION     8'14-[facility]  . CATARACT EXTRACTION, BILATERAL Bilateral   . CHOLECYSTECTOMY     open  . CHONDROPLASTY Left 04/27/2014   Procedure: abrasion CHONDROPLASTY of medial femoral condyle, abrasion chondroplasty of patella;  Surgeon: Tobi Bastos, MD;  Location: [facility] ORS;  Service: Orthopedics;  Laterality: Left;  . COLONOSCOPY  polyp removed  . EUS N/A 04/08/2013   Procedure: UPPER ENDOSCOPIC ULTRASOUND (EUS) LINEAR;  Surgeon: Milus Banister, MD;  Location: [facility] ENDOSCOPY;  Service: Endoscopy;  Laterality: N/A;  . KNEE ARTHROSCOPY WITH LATERAL MENISECTOMY Left 04/27/2014   Procedure: LEFT KNEE ARTHROSCOPY WITH  medial and LATERAL MENISECTOMY.;  Surgeon: Tobi Bastos, MD;  Location: [facility];  Service: Orthopedics;  Laterality: Left;    Current Medications: Current Meds  Medication Sig  . aspirin EC 81 MG tablet Take 81 mg by mouth daily.  Marland Kitchen ipratropium (ATROVENT HFA) 17 MCG/ACT inhaler Inhale 2 puffs into the lungs every 6 (six) hours.  Marland Kitchen levothyroxine (SYNTHROID, LEVOTHROID) 75 MCG  tablet Take 75 mcg by mouth daily before breakfast.   . metFORMIN (GLUCOPHAGE-XR) 500 MG 24 hr tablet Take 500 mg by mouth daily with breakfast.  . Methylcellulose, Laxative, (CITRUCEL PO) Take 1 capsule by mouth daily.  . polycarbophil (FIBERCON) 625 MG tablet Take 625 mg by mouth daily.  . propranolol (INDERAL) 20 MG tablet TK 1 T PO BID  . spironolactone (ALDACTONE) 25 MG tablet TK 0.5 T PO QAM  . torsemide (DEMADEX) 20 MG tablet Take 1 tablet (20 mg total) by mouth 2 (two) times daily.     Allergies:   Codeine; Quinine derivatives; Adhesive [tape]; Penicillins; and Procardia [nifedipine]   Social History   Socioeconomic History  . Marital status: Widowed    Spouse name: Not on file  . Number of children: Not on file  . Years of education: Not on file  . Highest education level: Not on file  Occupational History  . Not on file  Social Needs  . Financial resource strain: Not on file  . Food insecurity:    Worry: Not on file    Inability: Not on file  . Transportation needs:    Medical: Not on file    Non-medical: Not on file  Tobacco Use  . Smoking status: Never Smoker  . Smokeless tobacco: Never Used  Substance and Sexual Activity  . Alcohol use: No  . Drug use: No  . Sexual activity: Not Currently  Lifestyle  . Physical activity:    Days per week: Not on file    Minutes per session: Not on file  . Stress: Not on file  Relationships  . Social connections:    Talks on phone: Not on file    Gets together: Not on file    Attends religious service: Not on file    Active member of club or organization: Not on file    Attends meetings of clubs or organizations: Not on file    Relationship status: Not on file  Other Topics Concern  . Not on file  Social History Narrative  . Not on file     Family History: The patient's family history includes CAD in her sister. ROS:   Please see the history of present illness.    All other systems reviewed and are  negative.  EKGs/Labs/Other Studies Reviewed:    The following studies were reviewed today:  EKG:  EKG ordered today.  The ekg ordered today shows sinus rhythm old inferior apical MI  Recent Labs:   11/04/2017: BUN 12; Creatinine, Ser 0.81; Hemoglobin 12.1; NT-Pro BNP 255; Platelets 112; Potassium 4.4; Sodium 140  Recent Lipid Panel No results found for: CHOL, TRIG, HDL, CHOLHDL, VLDL, LDLCALC, LDLDIRECT  Physical Exam:    VS:  BP 132/64 (BP Location: Left Arm, Patient Position: Sitting, Cuff Size: Large)  Pulse 73   Ht 5\' 2"  (1.575 m)   Wt 173 lb 9.6 oz (78.7 kg)   SpO2 98%   BMI 31.75 kg/m     Wt Readings from Last 3 Encounters:  01/28/18 173 lb 9.6 oz (78.7 kg)  11/04/17 167 lb 9.6 oz (76 kg)  10/14/17 172 lb (78 kg)     GEN:  Well nourished, well developed in no acute distress HEENT: Normal NECK: No JVD; No carotid bruits LYMPHATICS: No lymphadenopathy CARDIAC: RRR, no murmurs, rubs, gallops RESPIRATORY:  Clear to auscultation without rales, wheezing or rhonchi  ABDOMEN: Soft, non-tender, non-distended MUSCULOSKELETAL:  No edema; No deformity  SKIN: Warm and dry NEUROLOGIC:  Alert and oriented x 3 PSYCHIATRIC:  Normal affect    Signed, Shirlee More, MD  01/28/2018 2:13 PM    [facility]

## 2018-01-28 NOTE — Patient Instructions (Signed)
Medication Instructions:   CHANGE torsemide (demadex) 20 mg: Take 1 tablet twice daily. Take an extra 20 mg (1 tablet) daily in the morning if your weight is greater than 168 pounds  If you need a refill on your cardiac medications before your next appointment, please call your pharmacy.   Lab work: None  If you have labs (blood work) drawn today and your tests are completely normal, you will receive your results only by: Marland Kitchen MyChart Message (if you have MyChart) OR . A paper copy in the mail If you have any lab test that is abnormal or we need to change your treatment, we will call you to review the results.  Testing/Procedures: You had an EKG today.   Follow-Up: At [facility], you and your health needs are our priority.  As part of our continuing mission to provide you with exceptional heart care, we have created designated Provider Care Teams.  These Care Teams include your primary Cardiologist (physician) and Advanced Practice Providers (APPs -  Physician Assistants and Nurse Practitioners) who all work together to provide you with the care you need, when you need it. You will need a follow up appointment in 6 months.  Please call our office 2 months in advance to schedule this appointment.

## 2018-01-30 DIAGNOSIS — R935 Abnormal findings on diagnostic imaging of other abdominal regions, including retroperitoneum: Secondary | ICD-10-CM | POA: Diagnosis not present

## 2018-01-30 DIAGNOSIS — K7460 Unspecified cirrhosis of liver: Secondary | ICD-10-CM | POA: Diagnosis not present

## 2018-01-30 DIAGNOSIS — D649 Anemia, unspecified: Secondary | ICD-10-CM | POA: Diagnosis not present

## 2018-02-04 ENCOUNTER — Ambulatory Visit: Payer: Self-pay | Admitting: Cardiology

## 2018-02-06 DIAGNOSIS — E039 Hypothyroidism, unspecified: Secondary | ICD-10-CM | POA: Diagnosis not present

## 2018-02-06 DIAGNOSIS — R945 Abnormal results of liver function studies: Secondary | ICD-10-CM | POA: Diagnosis not present

## 2018-02-19 DIAGNOSIS — H524 Presbyopia: Secondary | ICD-10-CM | POA: Diagnosis not present

## 2018-02-19 DIAGNOSIS — E119 Type 2 diabetes mellitus without complications: Secondary | ICD-10-CM | POA: Diagnosis not present

## 2018-02-19 DIAGNOSIS — H353132 Nonexudative age-related macular degeneration, bilateral, intermediate dry stage: Secondary | ICD-10-CM | POA: Diagnosis not present

## 2018-02-25 DIAGNOSIS — K7469 Other cirrhosis of liver: Secondary | ICD-10-CM | POA: Diagnosis not present

## 2018-03-28 DIAGNOSIS — E119 Type 2 diabetes mellitus without complications: Secondary | ICD-10-CM | POA: Diagnosis not present

## 2018-03-28 DIAGNOSIS — M79604 Pain in right leg: Secondary | ICD-10-CM | POA: Diagnosis not present

## 2018-03-28 DIAGNOSIS — M79651 Pain in right thigh: Secondary | ICD-10-CM | POA: Diagnosis not present

## 2018-03-28 DIAGNOSIS — E039 Hypothyroidism, unspecified: Secondary | ICD-10-CM | POA: Diagnosis not present

## 2018-03-28 DIAGNOSIS — M25551 Pain in right hip: Secondary | ICD-10-CM | POA: Diagnosis not present

## 2018-03-28 DIAGNOSIS — R262 Difficulty in walking, not elsewhere classified: Secondary | ICD-10-CM | POA: Diagnosis not present

## 2018-03-28 DIAGNOSIS — S79921A Unspecified injury of right thigh, initial encounter: Secondary | ICD-10-CM | POA: Diagnosis not present

## 2018-03-28 DIAGNOSIS — S329XXA Fracture of unspecified parts of lumbosacral spine and pelvis, initial encounter for closed fracture: Secondary | ICD-10-CM | POA: Diagnosis not present

## 2018-03-28 DIAGNOSIS — R52 Pain, unspecified: Secondary | ICD-10-CM | POA: Diagnosis not present

## 2018-03-28 DIAGNOSIS — I5032 Chronic diastolic (congestive) heart failure: Secondary | ICD-10-CM | POA: Diagnosis not present

## 2018-03-28 DIAGNOSIS — E1165 Type 2 diabetes mellitus with hyperglycemia: Secondary | ICD-10-CM | POA: Diagnosis not present

## 2018-03-28 DIAGNOSIS — W19XXXA Unspecified fall, initial encounter: Secondary | ICD-10-CM

## 2018-03-28 DIAGNOSIS — S79911A Unspecified injury of right hip, initial encounter: Secondary | ICD-10-CM | POA: Diagnosis not present

## 2018-03-28 DIAGNOSIS — S8991XA Unspecified injury of right lower leg, initial encounter: Secondary | ICD-10-CM | POA: Diagnosis not present

## 2018-03-28 DIAGNOSIS — S32474A Nondisplaced fracture of medial wall of right acetabulum, initial encounter for closed fracture: Secondary | ICD-10-CM | POA: Diagnosis not present

## 2018-03-28 DIAGNOSIS — B359 Dermatophytosis, unspecified: Secondary | ICD-10-CM | POA: Diagnosis not present

## 2018-03-28 DIAGNOSIS — S32810A Multiple fractures of pelvis with stable disruption of pelvic ring, initial encounter for closed fracture: Secondary | ICD-10-CM | POA: Diagnosis not present

## 2018-03-28 DIAGNOSIS — I2510 Atherosclerotic heart disease of native coronary artery without angina pectoris: Secondary | ICD-10-CM

## 2018-03-28 DIAGNOSIS — S79912A Unspecified injury of left hip, initial encounter: Secondary | ICD-10-CM | POA: Diagnosis not present

## 2018-03-28 DIAGNOSIS — I10 Essential (primary) hypertension: Secondary | ICD-10-CM | POA: Diagnosis not present

## 2018-03-28 DIAGNOSIS — S32591A Other specified fracture of right pubis, initial encounter for closed fracture: Secondary | ICD-10-CM | POA: Diagnosis not present

## 2018-03-29 DIAGNOSIS — E119 Type 2 diabetes mellitus without complications: Secondary | ICD-10-CM | POA: Diagnosis not present

## 2018-03-29 DIAGNOSIS — I10 Essential (primary) hypertension: Secondary | ICD-10-CM | POA: Diagnosis not present

## 2018-03-29 DIAGNOSIS — I361 Nonrheumatic tricuspid (valve) insufficiency: Secondary | ICD-10-CM | POA: Diagnosis not present

## 2018-03-29 DIAGNOSIS — I2510 Atherosclerotic heart disease of native coronary artery without angina pectoris: Secondary | ICD-10-CM | POA: Diagnosis not present

## 2018-03-29 DIAGNOSIS — I340 Nonrheumatic mitral (valve) insufficiency: Secondary | ICD-10-CM | POA: Diagnosis not present

## 2018-03-29 DIAGNOSIS — E039 Hypothyroidism, unspecified: Secondary | ICD-10-CM | POA: Diagnosis not present

## 2018-03-29 DIAGNOSIS — I5032 Chronic diastolic (congestive) heart failure: Secondary | ICD-10-CM | POA: Diagnosis not present

## 2018-03-30 DIAGNOSIS — S32810D Multiple fractures of pelvis with stable disruption of pelvic ring, subsequent encounter for fracture with routine healing: Secondary | ICD-10-CM | POA: Diagnosis not present

## 2018-03-30 DIAGNOSIS — Z7982 Long term (current) use of aspirin: Secondary | ICD-10-CM | POA: Diagnosis not present

## 2018-03-30 DIAGNOSIS — R2689 Other abnormalities of gait and mobility: Secondary | ICD-10-CM | POA: Diagnosis not present

## 2018-03-30 DIAGNOSIS — Z66 Do not resuscitate: Secondary | ICD-10-CM | POA: Diagnosis not present

## 2018-03-30 DIAGNOSIS — R278 Other lack of coordination: Secondary | ICD-10-CM | POA: Diagnosis not present

## 2018-03-30 DIAGNOSIS — J449 Chronic obstructive pulmonary disease, unspecified: Secondary | ICD-10-CM | POA: Diagnosis not present

## 2018-03-30 DIAGNOSIS — E039 Hypothyroidism, unspecified: Secondary | ICD-10-CM | POA: Diagnosis not present

## 2018-03-30 DIAGNOSIS — S32810A Multiple fractures of pelvis with stable disruption of pelvic ring, initial encounter for closed fracture: Secondary | ICD-10-CM | POA: Diagnosis not present

## 2018-03-30 DIAGNOSIS — Z741 Need for assistance with personal care: Secondary | ICD-10-CM | POA: Diagnosis not present

## 2018-03-30 DIAGNOSIS — I2510 Atherosclerotic heart disease of native coronary artery without angina pectoris: Secondary | ICD-10-CM | POA: Diagnosis not present

## 2018-03-30 DIAGNOSIS — Z79899 Other long term (current) drug therapy: Secondary | ICD-10-CM | POA: Diagnosis not present

## 2018-03-30 DIAGNOSIS — E119 Type 2 diabetes mellitus without complications: Secondary | ICD-10-CM | POA: Diagnosis not present

## 2018-03-30 DIAGNOSIS — Z8709 Personal history of other diseases of the respiratory system: Secondary | ICD-10-CM | POA: Diagnosis not present

## 2018-03-30 DIAGNOSIS — K7460 Unspecified cirrhosis of liver: Secondary | ICD-10-CM | POA: Diagnosis not present

## 2018-03-30 DIAGNOSIS — M25551 Pain in right hip: Secondary | ICD-10-CM | POA: Diagnosis not present

## 2018-03-30 DIAGNOSIS — R5383 Other fatigue: Secondary | ICD-10-CM | POA: Diagnosis not present

## 2018-03-30 DIAGNOSIS — I959 Hypotension, unspecified: Secondary | ICD-10-CM | POA: Diagnosis not present

## 2018-03-30 DIAGNOSIS — I10 Essential (primary) hypertension: Secondary | ICD-10-CM | POA: Diagnosis not present

## 2018-03-30 DIAGNOSIS — Z7401 Bed confinement status: Secondary | ICD-10-CM | POA: Diagnosis not present

## 2018-03-30 DIAGNOSIS — I110 Hypertensive heart disease with heart failure: Secondary | ICD-10-CM | POA: Diagnosis not present

## 2018-03-30 DIAGNOSIS — Z8673 Personal history of transient ischemic attack (TIA), and cerebral infarction without residual deficits: Secondary | ICD-10-CM | POA: Diagnosis not present

## 2018-03-30 DIAGNOSIS — M6281 Muscle weakness (generalized): Secondary | ICD-10-CM | POA: Diagnosis not present

## 2018-03-30 DIAGNOSIS — Z7984 Long term (current) use of oral hypoglycemic drugs: Secondary | ICD-10-CM | POA: Diagnosis not present

## 2018-03-30 DIAGNOSIS — I252 Old myocardial infarction: Secondary | ICD-10-CM | POA: Diagnosis not present

## 2018-03-30 DIAGNOSIS — I5032 Chronic diastolic (congestive) heart failure: Secondary | ICD-10-CM | POA: Diagnosis not present

## 2018-03-31 DIAGNOSIS — E119 Type 2 diabetes mellitus without complications: Secondary | ICD-10-CM | POA: Diagnosis not present

## 2018-03-31 DIAGNOSIS — I5030 Unspecified diastolic (congestive) heart failure: Secondary | ICD-10-CM | POA: Diagnosis not present

## 2018-03-31 DIAGNOSIS — I2510 Atherosclerotic heart disease of native coronary artery without angina pectoris: Secondary | ICD-10-CM | POA: Diagnosis not present

## 2018-03-31 DIAGNOSIS — M25551 Pain in right hip: Secondary | ICD-10-CM | POA: Diagnosis not present

## 2018-03-31 DIAGNOSIS — I10 Essential (primary) hypertension: Secondary | ICD-10-CM | POA: Diagnosis not present

## 2018-03-31 DIAGNOSIS — E039 Hypothyroidism, unspecified: Secondary | ICD-10-CM | POA: Diagnosis not present

## 2018-03-31 DIAGNOSIS — R2689 Other abnormalities of gait and mobility: Secondary | ICD-10-CM | POA: Diagnosis not present

## 2018-03-31 DIAGNOSIS — M6281 Muscle weakness (generalized): Secondary | ICD-10-CM | POA: Diagnosis not present

## 2018-03-31 DIAGNOSIS — R262 Difficulty in walking, not elsewhere classified: Secondary | ICD-10-CM | POA: Diagnosis not present

## 2018-03-31 DIAGNOSIS — S32591S Other specified fracture of right pubis, sequela: Secondary | ICD-10-CM | POA: Diagnosis not present

## 2018-03-31 DIAGNOSIS — I5032 Chronic diastolic (congestive) heart failure: Secondary | ICD-10-CM | POA: Diagnosis not present

## 2018-03-31 DIAGNOSIS — R5383 Other fatigue: Secondary | ICD-10-CM | POA: Diagnosis not present

## 2018-03-31 DIAGNOSIS — M069 Rheumatoid arthritis, unspecified: Secondary | ICD-10-CM | POA: Diagnosis not present

## 2018-03-31 DIAGNOSIS — S32810D Multiple fractures of pelvis with stable disruption of pelvic ring, subsequent encounter for fracture with routine healing: Secondary | ICD-10-CM | POA: Diagnosis not present

## 2018-03-31 DIAGNOSIS — E1159 Type 2 diabetes mellitus with other circulatory complications: Secondary | ICD-10-CM | POA: Diagnosis not present

## 2018-03-31 DIAGNOSIS — Z7401 Bed confinement status: Secondary | ICD-10-CM | POA: Diagnosis not present

## 2018-03-31 DIAGNOSIS — Z741 Need for assistance with personal care: Secondary | ICD-10-CM | POA: Diagnosis not present

## 2018-03-31 DIAGNOSIS — S32434A Nondisplaced fracture of anterior column [iliopubic] of right acetabulum, initial encounter for closed fracture: Secondary | ICD-10-CM | POA: Diagnosis not present

## 2018-03-31 DIAGNOSIS — J449 Chronic obstructive pulmonary disease, unspecified: Secondary | ICD-10-CM | POA: Diagnosis not present

## 2018-03-31 DIAGNOSIS — S32401S Unspecified fracture of right acetabulum, sequela: Secondary | ICD-10-CM | POA: Diagnosis not present

## 2018-03-31 DIAGNOSIS — I959 Hypotension, unspecified: Secondary | ICD-10-CM | POA: Diagnosis not present

## 2018-03-31 DIAGNOSIS — I252 Old myocardial infarction: Secondary | ICD-10-CM | POA: Diagnosis not present

## 2018-03-31 DIAGNOSIS — S32591A Other specified fracture of right pubis, initial encounter for closed fracture: Secondary | ICD-10-CM | POA: Diagnosis not present

## 2018-03-31 DIAGNOSIS — S3210XA Unspecified fracture of sacrum, initial encounter for closed fracture: Secondary | ICD-10-CM | POA: Diagnosis not present

## 2018-03-31 DIAGNOSIS — R278 Other lack of coordination: Secondary | ICD-10-CM | POA: Diagnosis not present

## 2018-04-03 DIAGNOSIS — S32591S Other specified fracture of right pubis, sequela: Secondary | ICD-10-CM | POA: Diagnosis not present

## 2018-04-03 DIAGNOSIS — I5030 Unspecified diastolic (congestive) heart failure: Secondary | ICD-10-CM | POA: Diagnosis not present

## 2018-04-03 DIAGNOSIS — E1159 Type 2 diabetes mellitus with other circulatory complications: Secondary | ICD-10-CM | POA: Diagnosis not present

## 2018-04-03 DIAGNOSIS — S32401S Unspecified fracture of right acetabulum, sequela: Secondary | ICD-10-CM | POA: Diagnosis not present

## 2018-04-07 DIAGNOSIS — S32401S Unspecified fracture of right acetabulum, sequela: Secondary | ICD-10-CM | POA: Diagnosis not present

## 2018-04-07 DIAGNOSIS — I5030 Unspecified diastolic (congestive) heart failure: Secondary | ICD-10-CM | POA: Diagnosis not present

## 2018-04-07 DIAGNOSIS — R262 Difficulty in walking, not elsewhere classified: Secondary | ICD-10-CM | POA: Diagnosis not present

## 2018-04-07 DIAGNOSIS — S32591S Other specified fracture of right pubis, sequela: Secondary | ICD-10-CM | POA: Diagnosis not present

## 2018-04-10 DIAGNOSIS — S3210XA Unspecified fracture of sacrum, initial encounter for closed fracture: Secondary | ICD-10-CM | POA: Diagnosis not present

## 2018-04-10 DIAGNOSIS — S32434A Nondisplaced fracture of anterior column [iliopubic] of right acetabulum, initial encounter for closed fracture: Secondary | ICD-10-CM | POA: Diagnosis not present

## 2018-04-10 DIAGNOSIS — S32591A Other specified fracture of right pubis, initial encounter for closed fracture: Secondary | ICD-10-CM | POA: Diagnosis not present

## 2018-04-21 DIAGNOSIS — I25118 Atherosclerotic heart disease of native coronary artery with other forms of angina pectoris: Secondary | ICD-10-CM | POA: Diagnosis not present

## 2018-04-21 DIAGNOSIS — I5032 Chronic diastolic (congestive) heart failure: Secondary | ICD-10-CM | POA: Diagnosis not present

## 2018-04-21 DIAGNOSIS — Z7982 Long term (current) use of aspirin: Secondary | ICD-10-CM | POA: Diagnosis not present

## 2018-04-21 DIAGNOSIS — I110 Hypertensive heart disease with heart failure: Secondary | ICD-10-CM | POA: Diagnosis not present

## 2018-04-21 DIAGNOSIS — E119 Type 2 diabetes mellitus without complications: Secondary | ICD-10-CM | POA: Diagnosis not present

## 2018-04-21 DIAGNOSIS — Z8673 Personal history of transient ischemic attack (TIA), and cerebral infarction without residual deficits: Secondary | ICD-10-CM | POA: Diagnosis not present

## 2018-04-21 DIAGNOSIS — S3210XD Unspecified fracture of sacrum, subsequent encounter for fracture with routine healing: Secondary | ICD-10-CM | POA: Diagnosis not present

## 2018-04-21 DIAGNOSIS — D649 Anemia, unspecified: Secondary | ICD-10-CM | POA: Diagnosis not present

## 2018-04-21 DIAGNOSIS — S32501D Unspecified fracture of right pubis, subsequent encounter for fracture with routine healing: Secondary | ICD-10-CM | POA: Diagnosis not present

## 2018-04-21 DIAGNOSIS — M069 Rheumatoid arthritis, unspecified: Secondary | ICD-10-CM | POA: Diagnosis not present

## 2018-04-21 DIAGNOSIS — Z7984 Long term (current) use of oral hypoglycemic drugs: Secondary | ICD-10-CM | POA: Diagnosis not present

## 2018-04-21 DIAGNOSIS — S32471D Displaced fracture of medial wall of right acetabulum, subsequent encounter for fracture with routine healing: Secondary | ICD-10-CM | POA: Diagnosis not present

## 2018-04-21 DIAGNOSIS — Z9181 History of falling: Secondary | ICD-10-CM | POA: Diagnosis not present

## 2018-04-21 DIAGNOSIS — J449 Chronic obstructive pulmonary disease, unspecified: Secondary | ICD-10-CM | POA: Diagnosis not present

## 2018-04-21 DIAGNOSIS — M109 Gout, unspecified: Secondary | ICD-10-CM | POA: Diagnosis not present

## 2018-04-21 DIAGNOSIS — E039 Hypothyroidism, unspecified: Secondary | ICD-10-CM | POA: Diagnosis not present

## 2018-04-22 DIAGNOSIS — M109 Gout, unspecified: Secondary | ICD-10-CM | POA: Diagnosis not present

## 2018-04-22 DIAGNOSIS — Z7984 Long term (current) use of oral hypoglycemic drugs: Secondary | ICD-10-CM | POA: Diagnosis not present

## 2018-04-22 DIAGNOSIS — D649 Anemia, unspecified: Secondary | ICD-10-CM | POA: Diagnosis not present

## 2018-04-22 DIAGNOSIS — I5032 Chronic diastolic (congestive) heart failure: Secondary | ICD-10-CM | POA: Diagnosis not present

## 2018-04-22 DIAGNOSIS — M069 Rheumatoid arthritis, unspecified: Secondary | ICD-10-CM | POA: Diagnosis not present

## 2018-04-22 DIAGNOSIS — S32501D Unspecified fracture of right pubis, subsequent encounter for fracture with routine healing: Secondary | ICD-10-CM | POA: Diagnosis not present

## 2018-04-22 DIAGNOSIS — I110 Hypertensive heart disease with heart failure: Secondary | ICD-10-CM | POA: Diagnosis not present

## 2018-04-22 DIAGNOSIS — Z8673 Personal history of transient ischemic attack (TIA), and cerebral infarction without residual deficits: Secondary | ICD-10-CM | POA: Diagnosis not present

## 2018-04-22 DIAGNOSIS — E039 Hypothyroidism, unspecified: Secondary | ICD-10-CM | POA: Diagnosis not present

## 2018-04-22 DIAGNOSIS — Z7982 Long term (current) use of aspirin: Secondary | ICD-10-CM | POA: Diagnosis not present

## 2018-04-22 DIAGNOSIS — I25118 Atherosclerotic heart disease of native coronary artery with other forms of angina pectoris: Secondary | ICD-10-CM | POA: Diagnosis not present

## 2018-04-22 DIAGNOSIS — S3210XD Unspecified fracture of sacrum, subsequent encounter for fracture with routine healing: Secondary | ICD-10-CM | POA: Diagnosis not present

## 2018-04-22 DIAGNOSIS — S32471D Displaced fracture of medial wall of right acetabulum, subsequent encounter for fracture with routine healing: Secondary | ICD-10-CM | POA: Diagnosis not present

## 2018-04-22 DIAGNOSIS — J449 Chronic obstructive pulmonary disease, unspecified: Secondary | ICD-10-CM | POA: Diagnosis not present

## 2018-04-22 DIAGNOSIS — E119 Type 2 diabetes mellitus without complications: Secondary | ICD-10-CM | POA: Diagnosis not present

## 2018-04-22 DIAGNOSIS — Z9181 History of falling: Secondary | ICD-10-CM | POA: Diagnosis not present

## 2018-04-23 DIAGNOSIS — E039 Hypothyroidism, unspecified: Secondary | ICD-10-CM | POA: Diagnosis not present

## 2018-04-23 DIAGNOSIS — E559 Vitamin D deficiency, unspecified: Secondary | ICD-10-CM | POA: Diagnosis not present

## 2018-04-23 DIAGNOSIS — R7302 Impaired glucose tolerance (oral): Secondary | ICD-10-CM | POA: Diagnosis not present

## 2018-04-23 DIAGNOSIS — R5383 Other fatigue: Secondary | ICD-10-CM | POA: Diagnosis not present

## 2018-04-23 DIAGNOSIS — Z79899 Other long term (current) drug therapy: Secondary | ICD-10-CM | POA: Diagnosis not present

## 2018-04-23 DIAGNOSIS — S329XXA Fracture of unspecified parts of lumbosacral spine and pelvis, initial encounter for closed fracture: Secondary | ICD-10-CM | POA: Diagnosis not present

## 2018-04-24 DIAGNOSIS — I110 Hypertensive heart disease with heart failure: Secondary | ICD-10-CM | POA: Diagnosis not present

## 2018-04-24 DIAGNOSIS — Z7982 Long term (current) use of aspirin: Secondary | ICD-10-CM | POA: Diagnosis not present

## 2018-04-24 DIAGNOSIS — I25118 Atherosclerotic heart disease of native coronary artery with other forms of angina pectoris: Secondary | ICD-10-CM | POA: Diagnosis not present

## 2018-04-24 DIAGNOSIS — E119 Type 2 diabetes mellitus without complications: Secondary | ICD-10-CM | POA: Diagnosis not present

## 2018-04-24 DIAGNOSIS — S3210XD Unspecified fracture of sacrum, subsequent encounter for fracture with routine healing: Secondary | ICD-10-CM | POA: Diagnosis not present

## 2018-04-24 DIAGNOSIS — Z7984 Long term (current) use of oral hypoglycemic drugs: Secondary | ICD-10-CM | POA: Diagnosis not present

## 2018-04-24 DIAGNOSIS — J449 Chronic obstructive pulmonary disease, unspecified: Secondary | ICD-10-CM | POA: Diagnosis not present

## 2018-04-24 DIAGNOSIS — Z9181 History of falling: Secondary | ICD-10-CM | POA: Diagnosis not present

## 2018-04-24 DIAGNOSIS — S32471D Displaced fracture of medial wall of right acetabulum, subsequent encounter for fracture with routine healing: Secondary | ICD-10-CM | POA: Diagnosis not present

## 2018-04-24 DIAGNOSIS — S32501D Unspecified fracture of right pubis, subsequent encounter for fracture with routine healing: Secondary | ICD-10-CM | POA: Diagnosis not present

## 2018-04-24 DIAGNOSIS — D649 Anemia, unspecified: Secondary | ICD-10-CM | POA: Diagnosis not present

## 2018-04-24 DIAGNOSIS — E039 Hypothyroidism, unspecified: Secondary | ICD-10-CM | POA: Diagnosis not present

## 2018-04-24 DIAGNOSIS — M069 Rheumatoid arthritis, unspecified: Secondary | ICD-10-CM | POA: Diagnosis not present

## 2018-04-24 DIAGNOSIS — Z8673 Personal history of transient ischemic attack (TIA), and cerebral infarction without residual deficits: Secondary | ICD-10-CM | POA: Diagnosis not present

## 2018-04-24 DIAGNOSIS — M109 Gout, unspecified: Secondary | ICD-10-CM | POA: Diagnosis not present

## 2018-04-24 DIAGNOSIS — I5032 Chronic diastolic (congestive) heart failure: Secondary | ICD-10-CM | POA: Diagnosis not present

## 2018-04-27 DIAGNOSIS — I25118 Atherosclerotic heart disease of native coronary artery with other forms of angina pectoris: Secondary | ICD-10-CM | POA: Diagnosis not present

## 2018-04-27 DIAGNOSIS — D649 Anemia, unspecified: Secondary | ICD-10-CM | POA: Diagnosis not present

## 2018-04-27 DIAGNOSIS — Z7982 Long term (current) use of aspirin: Secondary | ICD-10-CM | POA: Diagnosis not present

## 2018-04-27 DIAGNOSIS — S3210XD Unspecified fracture of sacrum, subsequent encounter for fracture with routine healing: Secondary | ICD-10-CM | POA: Diagnosis not present

## 2018-04-27 DIAGNOSIS — M109 Gout, unspecified: Secondary | ICD-10-CM | POA: Diagnosis not present

## 2018-04-27 DIAGNOSIS — S32501D Unspecified fracture of right pubis, subsequent encounter for fracture with routine healing: Secondary | ICD-10-CM | POA: Diagnosis not present

## 2018-04-27 DIAGNOSIS — M069 Rheumatoid arthritis, unspecified: Secondary | ICD-10-CM | POA: Diagnosis not present

## 2018-04-27 DIAGNOSIS — Z9181 History of falling: Secondary | ICD-10-CM | POA: Diagnosis not present

## 2018-04-27 DIAGNOSIS — Z8673 Personal history of transient ischemic attack (TIA), and cerebral infarction without residual deficits: Secondary | ICD-10-CM | POA: Diagnosis not present

## 2018-04-27 DIAGNOSIS — I5032 Chronic diastolic (congestive) heart failure: Secondary | ICD-10-CM | POA: Diagnosis not present

## 2018-04-27 DIAGNOSIS — E039 Hypothyroidism, unspecified: Secondary | ICD-10-CM | POA: Diagnosis not present

## 2018-04-27 DIAGNOSIS — E119 Type 2 diabetes mellitus without complications: Secondary | ICD-10-CM | POA: Diagnosis not present

## 2018-04-27 DIAGNOSIS — S32471D Displaced fracture of medial wall of right acetabulum, subsequent encounter for fracture with routine healing: Secondary | ICD-10-CM | POA: Diagnosis not present

## 2018-04-27 DIAGNOSIS — Z7984 Long term (current) use of oral hypoglycemic drugs: Secondary | ICD-10-CM | POA: Diagnosis not present

## 2018-04-27 DIAGNOSIS — J449 Chronic obstructive pulmonary disease, unspecified: Secondary | ICD-10-CM | POA: Diagnosis not present

## 2018-04-27 DIAGNOSIS — I110 Hypertensive heart disease with heart failure: Secondary | ICD-10-CM | POA: Diagnosis not present

## 2018-04-28 DIAGNOSIS — K5900 Constipation, unspecified: Secondary | ICD-10-CM | POA: Diagnosis not present

## 2018-04-28 DIAGNOSIS — S32501D Unspecified fracture of right pubis, subsequent encounter for fracture with routine healing: Secondary | ICD-10-CM | POA: Diagnosis not present

## 2018-04-28 DIAGNOSIS — I25118 Atherosclerotic heart disease of native coronary artery with other forms of angina pectoris: Secondary | ICD-10-CM | POA: Diagnosis not present

## 2018-04-28 DIAGNOSIS — K862 Cyst of pancreas: Secondary | ICD-10-CM | POA: Diagnosis not present

## 2018-04-28 DIAGNOSIS — D649 Anemia, unspecified: Secondary | ICD-10-CM | POA: Diagnosis not present

## 2018-04-28 DIAGNOSIS — Z9181 History of falling: Secondary | ICD-10-CM | POA: Diagnosis not present

## 2018-04-28 DIAGNOSIS — K56609 Unspecified intestinal obstruction, unspecified as to partial versus complete obstruction: Secondary | ICD-10-CM | POA: Diagnosis not present

## 2018-04-28 DIAGNOSIS — Z8673 Personal history of transient ischemic attack (TIA), and cerebral infarction without residual deficits: Secondary | ICD-10-CM | POA: Diagnosis not present

## 2018-04-28 DIAGNOSIS — J449 Chronic obstructive pulmonary disease, unspecified: Secondary | ICD-10-CM | POA: Diagnosis not present

## 2018-04-28 DIAGNOSIS — M2550 Pain in unspecified joint: Secondary | ICD-10-CM | POA: Diagnosis not present

## 2018-04-28 DIAGNOSIS — M109 Gout, unspecified: Secondary | ICD-10-CM | POA: Diagnosis not present

## 2018-04-28 DIAGNOSIS — R5381 Other malaise: Secondary | ICD-10-CM | POA: Diagnosis not present

## 2018-04-28 DIAGNOSIS — I509 Heart failure, unspecified: Secondary | ICD-10-CM | POA: Diagnosis not present

## 2018-04-28 DIAGNOSIS — Z7982 Long term (current) use of aspirin: Secondary | ICD-10-CM | POA: Diagnosis not present

## 2018-04-28 DIAGNOSIS — R112 Nausea with vomiting, unspecified: Secondary | ICD-10-CM | POA: Diagnosis not present

## 2018-04-28 DIAGNOSIS — I5032 Chronic diastolic (congestive) heart failure: Secondary | ICD-10-CM | POA: Diagnosis not present

## 2018-04-28 DIAGNOSIS — I2510 Atherosclerotic heart disease of native coronary artery without angina pectoris: Secondary | ICD-10-CM | POA: Diagnosis not present

## 2018-04-28 DIAGNOSIS — M069 Rheumatoid arthritis, unspecified: Secondary | ICD-10-CM | POA: Diagnosis not present

## 2018-04-28 DIAGNOSIS — I110 Hypertensive heart disease with heart failure: Secondary | ICD-10-CM | POA: Diagnosis not present

## 2018-04-28 DIAGNOSIS — S3210XD Unspecified fracture of sacrum, subsequent encounter for fracture with routine healing: Secondary | ICD-10-CM | POA: Diagnosis not present

## 2018-04-28 DIAGNOSIS — Z79899 Other long term (current) drug therapy: Secondary | ICD-10-CM | POA: Diagnosis not present

## 2018-04-28 DIAGNOSIS — E119 Type 2 diabetes mellitus without complications: Secondary | ICD-10-CM | POA: Diagnosis not present

## 2018-04-28 DIAGNOSIS — Z7401 Bed confinement status: Secondary | ICD-10-CM | POA: Diagnosis not present

## 2018-04-28 DIAGNOSIS — Z7984 Long term (current) use of oral hypoglycemic drugs: Secondary | ICD-10-CM | POA: Diagnosis not present

## 2018-04-28 DIAGNOSIS — S32471D Displaced fracture of medial wall of right acetabulum, subsequent encounter for fracture with routine healing: Secondary | ICD-10-CM | POA: Diagnosis not present

## 2018-04-28 DIAGNOSIS — E039 Hypothyroidism, unspecified: Secondary | ICD-10-CM | POA: Diagnosis not present

## 2018-04-29 DIAGNOSIS — Z8673 Personal history of transient ischemic attack (TIA), and cerebral infarction without residual deficits: Secondary | ICD-10-CM | POA: Diagnosis not present

## 2018-04-29 DIAGNOSIS — S32501D Unspecified fracture of right pubis, subsequent encounter for fracture with routine healing: Secondary | ICD-10-CM | POA: Diagnosis not present

## 2018-04-29 DIAGNOSIS — M109 Gout, unspecified: Secondary | ICD-10-CM | POA: Diagnosis not present

## 2018-04-29 DIAGNOSIS — J449 Chronic obstructive pulmonary disease, unspecified: Secondary | ICD-10-CM | POA: Diagnosis not present

## 2018-04-29 DIAGNOSIS — Z7984 Long term (current) use of oral hypoglycemic drugs: Secondary | ICD-10-CM | POA: Diagnosis not present

## 2018-04-29 DIAGNOSIS — S32471D Displaced fracture of medial wall of right acetabulum, subsequent encounter for fracture with routine healing: Secondary | ICD-10-CM | POA: Diagnosis not present

## 2018-04-29 DIAGNOSIS — E039 Hypothyroidism, unspecified: Secondary | ICD-10-CM | POA: Diagnosis not present

## 2018-04-29 DIAGNOSIS — Z7982 Long term (current) use of aspirin: Secondary | ICD-10-CM | POA: Diagnosis not present

## 2018-04-29 DIAGNOSIS — D649 Anemia, unspecified: Secondary | ICD-10-CM | POA: Diagnosis not present

## 2018-04-29 DIAGNOSIS — I5032 Chronic diastolic (congestive) heart failure: Secondary | ICD-10-CM | POA: Diagnosis not present

## 2018-04-29 DIAGNOSIS — M069 Rheumatoid arthritis, unspecified: Secondary | ICD-10-CM | POA: Diagnosis not present

## 2018-04-29 DIAGNOSIS — E119 Type 2 diabetes mellitus without complications: Secondary | ICD-10-CM | POA: Diagnosis not present

## 2018-04-29 DIAGNOSIS — S3210XD Unspecified fracture of sacrum, subsequent encounter for fracture with routine healing: Secondary | ICD-10-CM | POA: Diagnosis not present

## 2018-04-29 DIAGNOSIS — I110 Hypertensive heart disease with heart failure: Secondary | ICD-10-CM | POA: Diagnosis not present

## 2018-04-29 DIAGNOSIS — I25118 Atherosclerotic heart disease of native coronary artery with other forms of angina pectoris: Secondary | ICD-10-CM | POA: Diagnosis not present

## 2018-04-29 DIAGNOSIS — Z9181 History of falling: Secondary | ICD-10-CM | POA: Diagnosis not present

## 2018-04-30 ENCOUNTER — Other Ambulatory Visit: Payer: Self-pay

## 2018-04-30 NOTE — Patient Outreach (Signed)
[facility]) Care Management  04/30/2018  Casey Humphrey 18-Apr-1945 [id number]   Referral Date: 04/30/2018 Referral Source: [facility] referral Referral Reason: Help with incontinent supplies   Outreach Attempt: Spoke with patient. She requests that I speak with her sister Butch Penny.  Discussed reason for the referral.  Sister states that they are paying out of pocket for incontinent supplies and are in need of help with supplies.  She states that patient is going for evaluation with the stay well program and that they would supply incontinent supplies if she is part of that program.  Asked sister if patient utilized her OTC benefit through [facility] as it has incontinent supplies and she says they do but pay out of pocket for incontinent supplies.  Asked if patient had medicaid.  She states that makes too much for medicaid.    Discussed [facility] services with sister and advised her unfortunately our social workers have no resources for incontinent supplies other than if a patient has medicaid.  She verbalized understanding and denies any other concerns or needs at this time.     Plan: RN CM will close case.    Jone Baseman, RN, MSN [facility] Care Management Care Management Coordinator Direct Line [phone] Toll Free: [phone]  Fax: [phone]

## 2018-05-05 DIAGNOSIS — E039 Hypothyroidism, unspecified: Secondary | ICD-10-CM | POA: Diagnosis not present

## 2018-05-05 DIAGNOSIS — Z7982 Long term (current) use of aspirin: Secondary | ICD-10-CM | POA: Diagnosis not present

## 2018-05-05 DIAGNOSIS — S32471D Displaced fracture of medial wall of right acetabulum, subsequent encounter for fracture with routine healing: Secondary | ICD-10-CM | POA: Diagnosis not present

## 2018-05-05 DIAGNOSIS — M109 Gout, unspecified: Secondary | ICD-10-CM | POA: Diagnosis not present

## 2018-05-05 DIAGNOSIS — E119 Type 2 diabetes mellitus without complications: Secondary | ICD-10-CM | POA: Diagnosis not present

## 2018-05-05 DIAGNOSIS — Z9181 History of falling: Secondary | ICD-10-CM | POA: Diagnosis not present

## 2018-05-05 DIAGNOSIS — I5032 Chronic diastolic (congestive) heart failure: Secondary | ICD-10-CM | POA: Diagnosis not present

## 2018-05-05 DIAGNOSIS — Z8673 Personal history of transient ischemic attack (TIA), and cerebral infarction without residual deficits: Secondary | ICD-10-CM | POA: Diagnosis not present

## 2018-05-05 DIAGNOSIS — S32501D Unspecified fracture of right pubis, subsequent encounter for fracture with routine healing: Secondary | ICD-10-CM | POA: Diagnosis not present

## 2018-05-05 DIAGNOSIS — I110 Hypertensive heart disease with heart failure: Secondary | ICD-10-CM | POA: Diagnosis not present

## 2018-05-05 DIAGNOSIS — S3210XD Unspecified fracture of sacrum, subsequent encounter for fracture with routine healing: Secondary | ICD-10-CM | POA: Diagnosis not present

## 2018-05-05 DIAGNOSIS — I25118 Atherosclerotic heart disease of native coronary artery with other forms of angina pectoris: Secondary | ICD-10-CM | POA: Diagnosis not present

## 2018-05-05 DIAGNOSIS — D649 Anemia, unspecified: Secondary | ICD-10-CM | POA: Diagnosis not present

## 2018-05-05 DIAGNOSIS — M069 Rheumatoid arthritis, unspecified: Secondary | ICD-10-CM | POA: Diagnosis not present

## 2018-05-05 DIAGNOSIS — J449 Chronic obstructive pulmonary disease, unspecified: Secondary | ICD-10-CM | POA: Diagnosis not present

## 2018-05-05 DIAGNOSIS — Z7984 Long term (current) use of oral hypoglycemic drugs: Secondary | ICD-10-CM | POA: Diagnosis not present

## 2018-05-06 DIAGNOSIS — M069 Rheumatoid arthritis, unspecified: Secondary | ICD-10-CM | POA: Diagnosis not present

## 2018-05-06 DIAGNOSIS — Z8673 Personal history of transient ischemic attack (TIA), and cerebral infarction without residual deficits: Secondary | ICD-10-CM | POA: Diagnosis not present

## 2018-05-06 DIAGNOSIS — E039 Hypothyroidism, unspecified: Secondary | ICD-10-CM | POA: Diagnosis not present

## 2018-05-06 DIAGNOSIS — E119 Type 2 diabetes mellitus without complications: Secondary | ICD-10-CM | POA: Diagnosis not present

## 2018-05-06 DIAGNOSIS — J449 Chronic obstructive pulmonary disease, unspecified: Secondary | ICD-10-CM | POA: Diagnosis not present

## 2018-05-06 DIAGNOSIS — S32471D Displaced fracture of medial wall of right acetabulum, subsequent encounter for fracture with routine healing: Secondary | ICD-10-CM | POA: Diagnosis not present

## 2018-05-06 DIAGNOSIS — Z7984 Long term (current) use of oral hypoglycemic drugs: Secondary | ICD-10-CM | POA: Diagnosis not present

## 2018-05-06 DIAGNOSIS — Z9181 History of falling: Secondary | ICD-10-CM | POA: Diagnosis not present

## 2018-05-06 DIAGNOSIS — S32501D Unspecified fracture of right pubis, subsequent encounter for fracture with routine healing: Secondary | ICD-10-CM | POA: Diagnosis not present

## 2018-05-06 DIAGNOSIS — M109 Gout, unspecified: Secondary | ICD-10-CM | POA: Diagnosis not present

## 2018-05-06 DIAGNOSIS — I5032 Chronic diastolic (congestive) heart failure: Secondary | ICD-10-CM | POA: Diagnosis not present

## 2018-05-06 DIAGNOSIS — S3210XD Unspecified fracture of sacrum, subsequent encounter for fracture with routine healing: Secondary | ICD-10-CM | POA: Diagnosis not present

## 2018-05-06 DIAGNOSIS — I110 Hypertensive heart disease with heart failure: Secondary | ICD-10-CM | POA: Diagnosis not present

## 2018-05-06 DIAGNOSIS — D649 Anemia, unspecified: Secondary | ICD-10-CM | POA: Diagnosis not present

## 2018-05-06 DIAGNOSIS — I25118 Atherosclerotic heart disease of native coronary artery with other forms of angina pectoris: Secondary | ICD-10-CM | POA: Diagnosis not present

## 2018-05-06 DIAGNOSIS — Z7982 Long term (current) use of aspirin: Secondary | ICD-10-CM | POA: Diagnosis not present

## 2018-05-07 DIAGNOSIS — E119 Type 2 diabetes mellitus without complications: Secondary | ICD-10-CM | POA: Diagnosis not present

## 2018-05-07 DIAGNOSIS — J449 Chronic obstructive pulmonary disease, unspecified: Secondary | ICD-10-CM | POA: Diagnosis not present

## 2018-05-07 DIAGNOSIS — S32501D Unspecified fracture of right pubis, subsequent encounter for fracture with routine healing: Secondary | ICD-10-CM | POA: Diagnosis not present

## 2018-05-07 DIAGNOSIS — Z9181 History of falling: Secondary | ICD-10-CM | POA: Diagnosis not present

## 2018-05-07 DIAGNOSIS — Z8673 Personal history of transient ischemic attack (TIA), and cerebral infarction without residual deficits: Secondary | ICD-10-CM | POA: Diagnosis not present

## 2018-05-07 DIAGNOSIS — M069 Rheumatoid arthritis, unspecified: Secondary | ICD-10-CM | POA: Diagnosis not present

## 2018-05-07 DIAGNOSIS — Z7984 Long term (current) use of oral hypoglycemic drugs: Secondary | ICD-10-CM | POA: Diagnosis not present

## 2018-05-07 DIAGNOSIS — S32471D Displaced fracture of medial wall of right acetabulum, subsequent encounter for fracture with routine healing: Secondary | ICD-10-CM | POA: Diagnosis not present

## 2018-05-07 DIAGNOSIS — I25118 Atherosclerotic heart disease of native coronary artery with other forms of angina pectoris: Secondary | ICD-10-CM | POA: Diagnosis not present

## 2018-05-07 DIAGNOSIS — I110 Hypertensive heart disease with heart failure: Secondary | ICD-10-CM | POA: Diagnosis not present

## 2018-05-07 DIAGNOSIS — Z7982 Long term (current) use of aspirin: Secondary | ICD-10-CM | POA: Diagnosis not present

## 2018-05-07 DIAGNOSIS — I5032 Chronic diastolic (congestive) heart failure: Secondary | ICD-10-CM | POA: Diagnosis not present

## 2018-05-07 DIAGNOSIS — D649 Anemia, unspecified: Secondary | ICD-10-CM | POA: Diagnosis not present

## 2018-05-07 DIAGNOSIS — E039 Hypothyroidism, unspecified: Secondary | ICD-10-CM | POA: Diagnosis not present

## 2018-05-07 DIAGNOSIS — M109 Gout, unspecified: Secondary | ICD-10-CM | POA: Diagnosis not present

## 2018-05-07 DIAGNOSIS — S3210XD Unspecified fracture of sacrum, subsequent encounter for fracture with routine healing: Secondary | ICD-10-CM | POA: Diagnosis not present

## 2018-05-08 DIAGNOSIS — Z8673 Personal history of transient ischemic attack (TIA), and cerebral infarction without residual deficits: Secondary | ICD-10-CM | POA: Diagnosis not present

## 2018-05-08 DIAGNOSIS — I110 Hypertensive heart disease with heart failure: Secondary | ICD-10-CM | POA: Diagnosis not present

## 2018-05-08 DIAGNOSIS — S32501D Unspecified fracture of right pubis, subsequent encounter for fracture with routine healing: Secondary | ICD-10-CM | POA: Diagnosis not present

## 2018-05-08 DIAGNOSIS — E039 Hypothyroidism, unspecified: Secondary | ICD-10-CM | POA: Diagnosis not present

## 2018-05-08 DIAGNOSIS — Z7984 Long term (current) use of oral hypoglycemic drugs: Secondary | ICD-10-CM | POA: Diagnosis not present

## 2018-05-08 DIAGNOSIS — D649 Anemia, unspecified: Secondary | ICD-10-CM | POA: Diagnosis not present

## 2018-05-08 DIAGNOSIS — J449 Chronic obstructive pulmonary disease, unspecified: Secondary | ICD-10-CM | POA: Diagnosis not present

## 2018-05-08 DIAGNOSIS — Z7982 Long term (current) use of aspirin: Secondary | ICD-10-CM | POA: Diagnosis not present

## 2018-05-08 DIAGNOSIS — E119 Type 2 diabetes mellitus without complications: Secondary | ICD-10-CM | POA: Diagnosis not present

## 2018-05-08 DIAGNOSIS — I25118 Atherosclerotic heart disease of native coronary artery with other forms of angina pectoris: Secondary | ICD-10-CM | POA: Diagnosis not present

## 2018-05-08 DIAGNOSIS — S32471D Displaced fracture of medial wall of right acetabulum, subsequent encounter for fracture with routine healing: Secondary | ICD-10-CM | POA: Diagnosis not present

## 2018-05-08 DIAGNOSIS — Z9181 History of falling: Secondary | ICD-10-CM | POA: Diagnosis not present

## 2018-05-08 DIAGNOSIS — I5032 Chronic diastolic (congestive) heart failure: Secondary | ICD-10-CM | POA: Diagnosis not present

## 2018-05-08 DIAGNOSIS — M109 Gout, unspecified: Secondary | ICD-10-CM | POA: Diagnosis not present

## 2018-05-08 DIAGNOSIS — S3210XD Unspecified fracture of sacrum, subsequent encounter for fracture with routine healing: Secondary | ICD-10-CM | POA: Diagnosis not present

## 2018-05-08 DIAGNOSIS — M069 Rheumatoid arthritis, unspecified: Secondary | ICD-10-CM | POA: Diagnosis not present

## 2018-05-11 DIAGNOSIS — J449 Chronic obstructive pulmonary disease, unspecified: Secondary | ICD-10-CM | POA: Diagnosis not present

## 2018-05-11 DIAGNOSIS — M069 Rheumatoid arthritis, unspecified: Secondary | ICD-10-CM | POA: Diagnosis not present

## 2018-05-11 DIAGNOSIS — M6281 Muscle weakness (generalized): Secondary | ICD-10-CM | POA: Diagnosis not present

## 2018-05-11 DIAGNOSIS — I5032 Chronic diastolic (congestive) heart failure: Secondary | ICD-10-CM | POA: Diagnosis not present

## 2018-05-12 DIAGNOSIS — D649 Anemia, unspecified: Secondary | ICD-10-CM | POA: Diagnosis not present

## 2018-05-12 DIAGNOSIS — I110 Hypertensive heart disease with heart failure: Secondary | ICD-10-CM | POA: Diagnosis not present

## 2018-05-12 DIAGNOSIS — S32471D Displaced fracture of medial wall of right acetabulum, subsequent encounter for fracture with routine healing: Secondary | ICD-10-CM | POA: Diagnosis not present

## 2018-05-12 DIAGNOSIS — I25118 Atherosclerotic heart disease of native coronary artery with other forms of angina pectoris: Secondary | ICD-10-CM | POA: Diagnosis not present

## 2018-05-12 DIAGNOSIS — Z9181 History of falling: Secondary | ICD-10-CM | POA: Diagnosis not present

## 2018-05-12 DIAGNOSIS — J449 Chronic obstructive pulmonary disease, unspecified: Secondary | ICD-10-CM | POA: Diagnosis not present

## 2018-05-12 DIAGNOSIS — I5032 Chronic diastolic (congestive) heart failure: Secondary | ICD-10-CM | POA: Diagnosis not present

## 2018-05-12 DIAGNOSIS — E039 Hypothyroidism, unspecified: Secondary | ICD-10-CM | POA: Diagnosis not present

## 2018-05-12 DIAGNOSIS — Z8673 Personal history of transient ischemic attack (TIA), and cerebral infarction without residual deficits: Secondary | ICD-10-CM | POA: Diagnosis not present

## 2018-05-12 DIAGNOSIS — Z7984 Long term (current) use of oral hypoglycemic drugs: Secondary | ICD-10-CM | POA: Diagnosis not present

## 2018-05-12 DIAGNOSIS — S3210XD Unspecified fracture of sacrum, subsequent encounter for fracture with routine healing: Secondary | ICD-10-CM | POA: Diagnosis not present

## 2018-05-12 DIAGNOSIS — M109 Gout, unspecified: Secondary | ICD-10-CM | POA: Diagnosis not present

## 2018-05-12 DIAGNOSIS — S32501D Unspecified fracture of right pubis, subsequent encounter for fracture with routine healing: Secondary | ICD-10-CM | POA: Diagnosis not present

## 2018-05-12 DIAGNOSIS — E119 Type 2 diabetes mellitus without complications: Secondary | ICD-10-CM | POA: Diagnosis not present

## 2018-05-12 DIAGNOSIS — Z7982 Long term (current) use of aspirin: Secondary | ICD-10-CM | POA: Diagnosis not present

## 2018-05-12 DIAGNOSIS — M069 Rheumatoid arthritis, unspecified: Secondary | ICD-10-CM | POA: Diagnosis not present

## 2018-05-13 DIAGNOSIS — I110 Hypertensive heart disease with heart failure: Secondary | ICD-10-CM | POA: Diagnosis not present

## 2018-05-13 DIAGNOSIS — E039 Hypothyroidism, unspecified: Secondary | ICD-10-CM | POA: Diagnosis not present

## 2018-05-13 DIAGNOSIS — Z7984 Long term (current) use of oral hypoglycemic drugs: Secondary | ICD-10-CM | POA: Diagnosis not present

## 2018-05-13 DIAGNOSIS — D649 Anemia, unspecified: Secondary | ICD-10-CM | POA: Diagnosis not present

## 2018-05-13 DIAGNOSIS — S32501D Unspecified fracture of right pubis, subsequent encounter for fracture with routine healing: Secondary | ICD-10-CM | POA: Diagnosis not present

## 2018-05-13 DIAGNOSIS — M069 Rheumatoid arthritis, unspecified: Secondary | ICD-10-CM | POA: Diagnosis not present

## 2018-05-13 DIAGNOSIS — M109 Gout, unspecified: Secondary | ICD-10-CM | POA: Diagnosis not present

## 2018-05-13 DIAGNOSIS — J449 Chronic obstructive pulmonary disease, unspecified: Secondary | ICD-10-CM | POA: Diagnosis not present

## 2018-05-13 DIAGNOSIS — Z7982 Long term (current) use of aspirin: Secondary | ICD-10-CM | POA: Diagnosis not present

## 2018-05-13 DIAGNOSIS — I5032 Chronic diastolic (congestive) heart failure: Secondary | ICD-10-CM | POA: Diagnosis not present

## 2018-05-13 DIAGNOSIS — S3210XD Unspecified fracture of sacrum, subsequent encounter for fracture with routine healing: Secondary | ICD-10-CM | POA: Diagnosis not present

## 2018-05-13 DIAGNOSIS — E119 Type 2 diabetes mellitus without complications: Secondary | ICD-10-CM | POA: Diagnosis not present

## 2018-05-13 DIAGNOSIS — I25118 Atherosclerotic heart disease of native coronary artery with other forms of angina pectoris: Secondary | ICD-10-CM | POA: Diagnosis not present

## 2018-05-13 DIAGNOSIS — Z8673 Personal history of transient ischemic attack (TIA), and cerebral infarction without residual deficits: Secondary | ICD-10-CM | POA: Diagnosis not present

## 2018-05-13 DIAGNOSIS — S32471D Displaced fracture of medial wall of right acetabulum, subsequent encounter for fracture with routine healing: Secondary | ICD-10-CM | POA: Diagnosis not present

## 2018-05-13 DIAGNOSIS — Z9181 History of falling: Secondary | ICD-10-CM | POA: Diagnosis not present

## 2018-05-14 DIAGNOSIS — I110 Hypertensive heart disease with heart failure: Secondary | ICD-10-CM | POA: Diagnosis not present

## 2018-05-14 DIAGNOSIS — S32471D Displaced fracture of medial wall of right acetabulum, subsequent encounter for fracture with routine healing: Secondary | ICD-10-CM | POA: Diagnosis not present

## 2018-05-14 DIAGNOSIS — I25118 Atherosclerotic heart disease of native coronary artery with other forms of angina pectoris: Secondary | ICD-10-CM | POA: Diagnosis not present

## 2018-05-14 DIAGNOSIS — S3210XD Unspecified fracture of sacrum, subsequent encounter for fracture with routine healing: Secondary | ICD-10-CM | POA: Diagnosis not present

## 2018-05-14 DIAGNOSIS — Z7984 Long term (current) use of oral hypoglycemic drugs: Secondary | ICD-10-CM | POA: Diagnosis not present

## 2018-05-14 DIAGNOSIS — S32501D Unspecified fracture of right pubis, subsequent encounter for fracture with routine healing: Secondary | ICD-10-CM | POA: Diagnosis not present

## 2018-05-14 DIAGNOSIS — Z8673 Personal history of transient ischemic attack (TIA), and cerebral infarction without residual deficits: Secondary | ICD-10-CM | POA: Diagnosis not present

## 2018-05-14 DIAGNOSIS — E119 Type 2 diabetes mellitus without complications: Secondary | ICD-10-CM | POA: Diagnosis not present

## 2018-05-14 DIAGNOSIS — I5032 Chronic diastolic (congestive) heart failure: Secondary | ICD-10-CM | POA: Diagnosis not present

## 2018-05-14 DIAGNOSIS — E039 Hypothyroidism, unspecified: Secondary | ICD-10-CM | POA: Diagnosis not present

## 2018-05-14 DIAGNOSIS — Z7982 Long term (current) use of aspirin: Secondary | ICD-10-CM | POA: Diagnosis not present

## 2018-05-14 DIAGNOSIS — M109 Gout, unspecified: Secondary | ICD-10-CM | POA: Diagnosis not present

## 2018-05-14 DIAGNOSIS — M069 Rheumatoid arthritis, unspecified: Secondary | ICD-10-CM | POA: Diagnosis not present

## 2018-05-14 DIAGNOSIS — Z9181 History of falling: Secondary | ICD-10-CM | POA: Diagnosis not present

## 2018-05-14 DIAGNOSIS — D649 Anemia, unspecified: Secondary | ICD-10-CM | POA: Diagnosis not present

## 2018-05-14 DIAGNOSIS — J449 Chronic obstructive pulmonary disease, unspecified: Secondary | ICD-10-CM | POA: Diagnosis not present

## 2018-05-16 DIAGNOSIS — I5032 Chronic diastolic (congestive) heart failure: Secondary | ICD-10-CM | POA: Diagnosis not present

## 2018-05-16 DIAGNOSIS — J449 Chronic obstructive pulmonary disease, unspecified: Secondary | ICD-10-CM | POA: Diagnosis not present

## 2018-05-16 DIAGNOSIS — M069 Rheumatoid arthritis, unspecified: Secondary | ICD-10-CM | POA: Diagnosis not present

## 2018-05-16 DIAGNOSIS — M6281 Muscle weakness (generalized): Secondary | ICD-10-CM | POA: Diagnosis not present

## 2018-05-19 DIAGNOSIS — Z8673 Personal history of transient ischemic attack (TIA), and cerebral infarction without residual deficits: Secondary | ICD-10-CM | POA: Diagnosis not present

## 2018-05-19 DIAGNOSIS — S3210XD Unspecified fracture of sacrum, subsequent encounter for fracture with routine healing: Secondary | ICD-10-CM | POA: Diagnosis not present

## 2018-05-19 DIAGNOSIS — S32471D Displaced fracture of medial wall of right acetabulum, subsequent encounter for fracture with routine healing: Secondary | ICD-10-CM | POA: Diagnosis not present

## 2018-05-19 DIAGNOSIS — I25118 Atherosclerotic heart disease of native coronary artery with other forms of angina pectoris: Secondary | ICD-10-CM | POA: Diagnosis not present

## 2018-05-19 DIAGNOSIS — E039 Hypothyroidism, unspecified: Secondary | ICD-10-CM | POA: Diagnosis not present

## 2018-05-19 DIAGNOSIS — M109 Gout, unspecified: Secondary | ICD-10-CM | POA: Diagnosis not present

## 2018-05-19 DIAGNOSIS — M069 Rheumatoid arthritis, unspecified: Secondary | ICD-10-CM | POA: Diagnosis not present

## 2018-05-19 DIAGNOSIS — S32501D Unspecified fracture of right pubis, subsequent encounter for fracture with routine healing: Secondary | ICD-10-CM | POA: Diagnosis not present

## 2018-05-19 DIAGNOSIS — Z7984 Long term (current) use of oral hypoglycemic drugs: Secondary | ICD-10-CM | POA: Diagnosis not present

## 2018-05-19 DIAGNOSIS — D649 Anemia, unspecified: Secondary | ICD-10-CM | POA: Diagnosis not present

## 2018-05-19 DIAGNOSIS — I5032 Chronic diastolic (congestive) heart failure: Secondary | ICD-10-CM | POA: Diagnosis not present

## 2018-05-19 DIAGNOSIS — E119 Type 2 diabetes mellitus without complications: Secondary | ICD-10-CM | POA: Diagnosis not present

## 2018-05-19 DIAGNOSIS — Z7982 Long term (current) use of aspirin: Secondary | ICD-10-CM | POA: Diagnosis not present

## 2018-05-19 DIAGNOSIS — Z9181 History of falling: Secondary | ICD-10-CM | POA: Diagnosis not present

## 2018-05-19 DIAGNOSIS — J449 Chronic obstructive pulmonary disease, unspecified: Secondary | ICD-10-CM | POA: Diagnosis not present

## 2018-05-19 DIAGNOSIS — I110 Hypertensive heart disease with heart failure: Secondary | ICD-10-CM | POA: Diagnosis not present

## 2018-05-20 DIAGNOSIS — S32434D Nondisplaced fracture of anterior column [iliopubic] of right acetabulum, subsequent encounter for fracture with routine healing: Secondary | ICD-10-CM | POA: Diagnosis not present

## 2018-05-20 DIAGNOSIS — S3210XD Unspecified fracture of sacrum, subsequent encounter for fracture with routine healing: Secondary | ICD-10-CM | POA: Diagnosis not present

## 2018-05-20 DIAGNOSIS — S32591D Other specified fracture of right pubis, subsequent encounter for fracture with routine healing: Secondary | ICD-10-CM | POA: Diagnosis not present

## 2018-05-21 DIAGNOSIS — Z8673 Personal history of transient ischemic attack (TIA), and cerebral infarction without residual deficits: Secondary | ICD-10-CM | POA: Diagnosis not present

## 2018-05-21 DIAGNOSIS — I25118 Atherosclerotic heart disease of native coronary artery with other forms of angina pectoris: Secondary | ICD-10-CM | POA: Diagnosis not present

## 2018-05-21 DIAGNOSIS — S32471D Displaced fracture of medial wall of right acetabulum, subsequent encounter for fracture with routine healing: Secondary | ICD-10-CM | POA: Diagnosis not present

## 2018-05-21 DIAGNOSIS — I5032 Chronic diastolic (congestive) heart failure: Secondary | ICD-10-CM | POA: Diagnosis not present

## 2018-05-21 DIAGNOSIS — M109 Gout, unspecified: Secondary | ICD-10-CM | POA: Diagnosis not present

## 2018-05-21 DIAGNOSIS — I110 Hypertensive heart disease with heart failure: Secondary | ICD-10-CM | POA: Diagnosis not present

## 2018-05-21 DIAGNOSIS — D649 Anemia, unspecified: Secondary | ICD-10-CM | POA: Diagnosis not present

## 2018-05-21 DIAGNOSIS — S32501D Unspecified fracture of right pubis, subsequent encounter for fracture with routine healing: Secondary | ICD-10-CM | POA: Diagnosis not present

## 2018-05-21 DIAGNOSIS — Z9181 History of falling: Secondary | ICD-10-CM | POA: Diagnosis not present

## 2018-05-21 DIAGNOSIS — S3210XD Unspecified fracture of sacrum, subsequent encounter for fracture with routine healing: Secondary | ICD-10-CM | POA: Diagnosis not present

## 2018-05-21 DIAGNOSIS — E039 Hypothyroidism, unspecified: Secondary | ICD-10-CM | POA: Diagnosis not present

## 2018-05-21 DIAGNOSIS — M069 Rheumatoid arthritis, unspecified: Secondary | ICD-10-CM | POA: Diagnosis not present

## 2018-05-21 DIAGNOSIS — E119 Type 2 diabetes mellitus without complications: Secondary | ICD-10-CM | POA: Diagnosis not present

## 2018-05-21 DIAGNOSIS — Z7984 Long term (current) use of oral hypoglycemic drugs: Secondary | ICD-10-CM | POA: Diagnosis not present

## 2018-05-21 DIAGNOSIS — Z7982 Long term (current) use of aspirin: Secondary | ICD-10-CM | POA: Diagnosis not present

## 2018-05-21 DIAGNOSIS — J449 Chronic obstructive pulmonary disease, unspecified: Secondary | ICD-10-CM | POA: Diagnosis not present

## 2018-05-22 DIAGNOSIS — Z8673 Personal history of transient ischemic attack (TIA), and cerebral infarction without residual deficits: Secondary | ICD-10-CM | POA: Diagnosis not present

## 2018-05-22 DIAGNOSIS — E039 Hypothyroidism, unspecified: Secondary | ICD-10-CM | POA: Diagnosis not present

## 2018-05-22 DIAGNOSIS — Z9181 History of falling: Secondary | ICD-10-CM | POA: Diagnosis not present

## 2018-05-22 DIAGNOSIS — S3210XD Unspecified fracture of sacrum, subsequent encounter for fracture with routine healing: Secondary | ICD-10-CM | POA: Diagnosis not present

## 2018-05-22 DIAGNOSIS — M109 Gout, unspecified: Secondary | ICD-10-CM | POA: Diagnosis not present

## 2018-05-22 DIAGNOSIS — I25118 Atherosclerotic heart disease of native coronary artery with other forms of angina pectoris: Secondary | ICD-10-CM | POA: Diagnosis not present

## 2018-05-22 DIAGNOSIS — S32501D Unspecified fracture of right pubis, subsequent encounter for fracture with routine healing: Secondary | ICD-10-CM | POA: Diagnosis not present

## 2018-05-22 DIAGNOSIS — E119 Type 2 diabetes mellitus without complications: Secondary | ICD-10-CM | POA: Diagnosis not present

## 2018-05-22 DIAGNOSIS — S32471D Displaced fracture of medial wall of right acetabulum, subsequent encounter for fracture with routine healing: Secondary | ICD-10-CM | POA: Diagnosis not present

## 2018-05-22 DIAGNOSIS — D649 Anemia, unspecified: Secondary | ICD-10-CM | POA: Diagnosis not present

## 2018-05-22 DIAGNOSIS — I5032 Chronic diastolic (congestive) heart failure: Secondary | ICD-10-CM | POA: Diagnosis not present

## 2018-05-22 DIAGNOSIS — M069 Rheumatoid arthritis, unspecified: Secondary | ICD-10-CM | POA: Diagnosis not present

## 2018-05-22 DIAGNOSIS — Z7982 Long term (current) use of aspirin: Secondary | ICD-10-CM | POA: Diagnosis not present

## 2018-05-22 DIAGNOSIS — J449 Chronic obstructive pulmonary disease, unspecified: Secondary | ICD-10-CM | POA: Diagnosis not present

## 2018-05-22 DIAGNOSIS — Z7984 Long term (current) use of oral hypoglycemic drugs: Secondary | ICD-10-CM | POA: Diagnosis not present

## 2018-05-22 DIAGNOSIS — I110 Hypertensive heart disease with heart failure: Secondary | ICD-10-CM | POA: Diagnosis not present

## 2018-05-24 DIAGNOSIS — Z8673 Personal history of transient ischemic attack (TIA), and cerebral infarction without residual deficits: Secondary | ICD-10-CM | POA: Diagnosis not present

## 2018-05-24 DIAGNOSIS — M069 Rheumatoid arthritis, unspecified: Secondary | ICD-10-CM | POA: Diagnosis not present

## 2018-05-24 DIAGNOSIS — S32471D Displaced fracture of medial wall of right acetabulum, subsequent encounter for fracture with routine healing: Secondary | ICD-10-CM | POA: Diagnosis not present

## 2018-05-24 DIAGNOSIS — E119 Type 2 diabetes mellitus without complications: Secondary | ICD-10-CM | POA: Diagnosis not present

## 2018-05-24 DIAGNOSIS — I5032 Chronic diastolic (congestive) heart failure: Secondary | ICD-10-CM | POA: Diagnosis not present

## 2018-05-24 DIAGNOSIS — J449 Chronic obstructive pulmonary disease, unspecified: Secondary | ICD-10-CM | POA: Diagnosis not present

## 2018-05-24 DIAGNOSIS — I110 Hypertensive heart disease with heart failure: Secondary | ICD-10-CM | POA: Diagnosis not present

## 2018-05-24 DIAGNOSIS — I25118 Atherosclerotic heart disease of native coronary artery with other forms of angina pectoris: Secondary | ICD-10-CM | POA: Diagnosis not present

## 2018-05-24 DIAGNOSIS — E039 Hypothyroidism, unspecified: Secondary | ICD-10-CM | POA: Diagnosis not present

## 2018-05-24 DIAGNOSIS — S32501D Unspecified fracture of right pubis, subsequent encounter for fracture with routine healing: Secondary | ICD-10-CM | POA: Diagnosis not present

## 2018-05-24 DIAGNOSIS — Z9181 History of falling: Secondary | ICD-10-CM | POA: Diagnosis not present

## 2018-05-24 DIAGNOSIS — Z7982 Long term (current) use of aspirin: Secondary | ICD-10-CM | POA: Diagnosis not present

## 2018-05-24 DIAGNOSIS — S3210XD Unspecified fracture of sacrum, subsequent encounter for fracture with routine healing: Secondary | ICD-10-CM | POA: Diagnosis not present

## 2018-05-24 DIAGNOSIS — M109 Gout, unspecified: Secondary | ICD-10-CM | POA: Diagnosis not present

## 2018-05-24 DIAGNOSIS — Z7984 Long term (current) use of oral hypoglycemic drugs: Secondary | ICD-10-CM | POA: Diagnosis not present

## 2018-05-24 DIAGNOSIS — D649 Anemia, unspecified: Secondary | ICD-10-CM | POA: Diagnosis not present

## 2018-05-25 DIAGNOSIS — E039 Hypothyroidism, unspecified: Secondary | ICD-10-CM | POA: Diagnosis not present

## 2018-05-25 DIAGNOSIS — I5032 Chronic diastolic (congestive) heart failure: Secondary | ICD-10-CM | POA: Diagnosis not present

## 2018-05-25 DIAGNOSIS — Z7982 Long term (current) use of aspirin: Secondary | ICD-10-CM | POA: Diagnosis not present

## 2018-05-25 DIAGNOSIS — J449 Chronic obstructive pulmonary disease, unspecified: Secondary | ICD-10-CM | POA: Diagnosis not present

## 2018-05-25 DIAGNOSIS — I25118 Atherosclerotic heart disease of native coronary artery with other forms of angina pectoris: Secondary | ICD-10-CM | POA: Diagnosis not present

## 2018-05-25 DIAGNOSIS — Z7984 Long term (current) use of oral hypoglycemic drugs: Secondary | ICD-10-CM | POA: Diagnosis not present

## 2018-05-25 DIAGNOSIS — Z8673 Personal history of transient ischemic attack (TIA), and cerebral infarction without residual deficits: Secondary | ICD-10-CM | POA: Diagnosis not present

## 2018-05-25 DIAGNOSIS — D649 Anemia, unspecified: Secondary | ICD-10-CM | POA: Diagnosis not present

## 2018-05-25 DIAGNOSIS — I110 Hypertensive heart disease with heart failure: Secondary | ICD-10-CM | POA: Diagnosis not present

## 2018-05-25 DIAGNOSIS — E119 Type 2 diabetes mellitus without complications: Secondary | ICD-10-CM | POA: Diagnosis not present

## 2018-05-25 DIAGNOSIS — S32501D Unspecified fracture of right pubis, subsequent encounter for fracture with routine healing: Secondary | ICD-10-CM | POA: Diagnosis not present

## 2018-05-25 DIAGNOSIS — S3210XD Unspecified fracture of sacrum, subsequent encounter for fracture with routine healing: Secondary | ICD-10-CM | POA: Diagnosis not present

## 2018-05-25 DIAGNOSIS — Z9181 History of falling: Secondary | ICD-10-CM | POA: Diagnosis not present

## 2018-05-25 DIAGNOSIS — S32471D Displaced fracture of medial wall of right acetabulum, subsequent encounter for fracture with routine healing: Secondary | ICD-10-CM | POA: Diagnosis not present

## 2018-05-25 DIAGNOSIS — M109 Gout, unspecified: Secondary | ICD-10-CM | POA: Diagnosis not present

## 2018-05-25 DIAGNOSIS — M069 Rheumatoid arthritis, unspecified: Secondary | ICD-10-CM | POA: Diagnosis not present

## 2018-05-28 DIAGNOSIS — E119 Type 2 diabetes mellitus without complications: Secondary | ICD-10-CM | POA: Diagnosis not present

## 2018-05-28 DIAGNOSIS — M069 Rheumatoid arthritis, unspecified: Secondary | ICD-10-CM | POA: Diagnosis not present

## 2018-05-28 DIAGNOSIS — I25118 Atherosclerotic heart disease of native coronary artery with other forms of angina pectoris: Secondary | ICD-10-CM | POA: Diagnosis not present

## 2018-05-28 DIAGNOSIS — Z8673 Personal history of transient ischemic attack (TIA), and cerebral infarction without residual deficits: Secondary | ICD-10-CM | POA: Diagnosis not present

## 2018-05-28 DIAGNOSIS — I110 Hypertensive heart disease with heart failure: Secondary | ICD-10-CM | POA: Diagnosis not present

## 2018-05-28 DIAGNOSIS — J449 Chronic obstructive pulmonary disease, unspecified: Secondary | ICD-10-CM | POA: Diagnosis not present

## 2018-05-28 DIAGNOSIS — Z7982 Long term (current) use of aspirin: Secondary | ICD-10-CM | POA: Diagnosis not present

## 2018-05-28 DIAGNOSIS — D649 Anemia, unspecified: Secondary | ICD-10-CM | POA: Diagnosis not present

## 2018-05-28 DIAGNOSIS — Z7984 Long term (current) use of oral hypoglycemic drugs: Secondary | ICD-10-CM | POA: Diagnosis not present

## 2018-05-28 DIAGNOSIS — Z9181 History of falling: Secondary | ICD-10-CM | POA: Diagnosis not present

## 2018-05-28 DIAGNOSIS — M109 Gout, unspecified: Secondary | ICD-10-CM | POA: Diagnosis not present

## 2018-05-28 DIAGNOSIS — S3210XD Unspecified fracture of sacrum, subsequent encounter for fracture with routine healing: Secondary | ICD-10-CM | POA: Diagnosis not present

## 2018-05-28 DIAGNOSIS — S32501D Unspecified fracture of right pubis, subsequent encounter for fracture with routine healing: Secondary | ICD-10-CM | POA: Diagnosis not present

## 2018-05-28 DIAGNOSIS — E039 Hypothyroidism, unspecified: Secondary | ICD-10-CM | POA: Diagnosis not present

## 2018-05-28 DIAGNOSIS — I5032 Chronic diastolic (congestive) heart failure: Secondary | ICD-10-CM | POA: Diagnosis not present

## 2018-05-28 DIAGNOSIS — S32471D Displaced fracture of medial wall of right acetabulum, subsequent encounter for fracture with routine healing: Secondary | ICD-10-CM | POA: Diagnosis not present

## 2018-05-30 DIAGNOSIS — Z7984 Long term (current) use of oral hypoglycemic drugs: Secondary | ICD-10-CM | POA: Diagnosis not present

## 2018-05-30 DIAGNOSIS — E039 Hypothyroidism, unspecified: Secondary | ICD-10-CM | POA: Diagnosis not present

## 2018-05-30 DIAGNOSIS — Z8673 Personal history of transient ischemic attack (TIA), and cerebral infarction without residual deficits: Secondary | ICD-10-CM | POA: Diagnosis not present

## 2018-05-30 DIAGNOSIS — Z7982 Long term (current) use of aspirin: Secondary | ICD-10-CM | POA: Diagnosis not present

## 2018-05-30 DIAGNOSIS — S32501D Unspecified fracture of right pubis, subsequent encounter for fracture with routine healing: Secondary | ICD-10-CM | POA: Diagnosis not present

## 2018-05-30 DIAGNOSIS — J449 Chronic obstructive pulmonary disease, unspecified: Secondary | ICD-10-CM | POA: Diagnosis not present

## 2018-05-30 DIAGNOSIS — M109 Gout, unspecified: Secondary | ICD-10-CM | POA: Diagnosis not present

## 2018-05-30 DIAGNOSIS — I110 Hypertensive heart disease with heart failure: Secondary | ICD-10-CM | POA: Diagnosis not present

## 2018-05-30 DIAGNOSIS — Z9181 History of falling: Secondary | ICD-10-CM | POA: Diagnosis not present

## 2018-05-30 DIAGNOSIS — S32471D Displaced fracture of medial wall of right acetabulum, subsequent encounter for fracture with routine healing: Secondary | ICD-10-CM | POA: Diagnosis not present

## 2018-05-30 DIAGNOSIS — I25118 Atherosclerotic heart disease of native coronary artery with other forms of angina pectoris: Secondary | ICD-10-CM | POA: Diagnosis not present

## 2018-05-30 DIAGNOSIS — D649 Anemia, unspecified: Secondary | ICD-10-CM | POA: Diagnosis not present

## 2018-05-30 DIAGNOSIS — S3210XD Unspecified fracture of sacrum, subsequent encounter for fracture with routine healing: Secondary | ICD-10-CM | POA: Diagnosis not present

## 2018-05-30 DIAGNOSIS — M069 Rheumatoid arthritis, unspecified: Secondary | ICD-10-CM | POA: Diagnosis not present

## 2018-05-30 DIAGNOSIS — I5032 Chronic diastolic (congestive) heart failure: Secondary | ICD-10-CM | POA: Diagnosis not present

## 2018-05-30 DIAGNOSIS — E119 Type 2 diabetes mellitus without complications: Secondary | ICD-10-CM | POA: Diagnosis not present

## 2018-06-01 DIAGNOSIS — M109 Gout, unspecified: Secondary | ICD-10-CM | POA: Diagnosis not present

## 2018-06-01 DIAGNOSIS — S3210XD Unspecified fracture of sacrum, subsequent encounter for fracture with routine healing: Secondary | ICD-10-CM | POA: Diagnosis not present

## 2018-06-01 DIAGNOSIS — I5032 Chronic diastolic (congestive) heart failure: Secondary | ICD-10-CM | POA: Diagnosis not present

## 2018-06-01 DIAGNOSIS — Z7982 Long term (current) use of aspirin: Secondary | ICD-10-CM | POA: Diagnosis not present

## 2018-06-01 DIAGNOSIS — E039 Hypothyroidism, unspecified: Secondary | ICD-10-CM | POA: Diagnosis not present

## 2018-06-01 DIAGNOSIS — Z9181 History of falling: Secondary | ICD-10-CM | POA: Diagnosis not present

## 2018-06-01 DIAGNOSIS — M069 Rheumatoid arthritis, unspecified: Secondary | ICD-10-CM | POA: Diagnosis not present

## 2018-06-01 DIAGNOSIS — S32471D Displaced fracture of medial wall of right acetabulum, subsequent encounter for fracture with routine healing: Secondary | ICD-10-CM | POA: Diagnosis not present

## 2018-06-01 DIAGNOSIS — Z8673 Personal history of transient ischemic attack (TIA), and cerebral infarction without residual deficits: Secondary | ICD-10-CM | POA: Diagnosis not present

## 2018-06-01 DIAGNOSIS — J449 Chronic obstructive pulmonary disease, unspecified: Secondary | ICD-10-CM | POA: Diagnosis not present

## 2018-06-01 DIAGNOSIS — I25118 Atherosclerotic heart disease of native coronary artery with other forms of angina pectoris: Secondary | ICD-10-CM | POA: Diagnosis not present

## 2018-06-01 DIAGNOSIS — Z7984 Long term (current) use of oral hypoglycemic drugs: Secondary | ICD-10-CM | POA: Diagnosis not present

## 2018-06-01 DIAGNOSIS — I110 Hypertensive heart disease with heart failure: Secondary | ICD-10-CM | POA: Diagnosis not present

## 2018-06-01 DIAGNOSIS — S32501D Unspecified fracture of right pubis, subsequent encounter for fracture with routine healing: Secondary | ICD-10-CM | POA: Diagnosis not present

## 2018-06-01 DIAGNOSIS — E119 Type 2 diabetes mellitus without complications: Secondary | ICD-10-CM | POA: Diagnosis not present

## 2018-06-01 DIAGNOSIS — D649 Anemia, unspecified: Secondary | ICD-10-CM | POA: Diagnosis not present

## 2018-06-02 DIAGNOSIS — E119 Type 2 diabetes mellitus without complications: Secondary | ICD-10-CM | POA: Diagnosis not present

## 2018-06-02 DIAGNOSIS — I25118 Atherosclerotic heart disease of native coronary artery with other forms of angina pectoris: Secondary | ICD-10-CM | POA: Diagnosis not present

## 2018-06-02 DIAGNOSIS — I5032 Chronic diastolic (congestive) heart failure: Secondary | ICD-10-CM | POA: Diagnosis not present

## 2018-06-02 DIAGNOSIS — Z7984 Long term (current) use of oral hypoglycemic drugs: Secondary | ICD-10-CM | POA: Diagnosis not present

## 2018-06-02 DIAGNOSIS — S3210XD Unspecified fracture of sacrum, subsequent encounter for fracture with routine healing: Secondary | ICD-10-CM | POA: Diagnosis not present

## 2018-06-02 DIAGNOSIS — Z7982 Long term (current) use of aspirin: Secondary | ICD-10-CM | POA: Diagnosis not present

## 2018-06-02 DIAGNOSIS — I110 Hypertensive heart disease with heart failure: Secondary | ICD-10-CM | POA: Diagnosis not present

## 2018-06-02 DIAGNOSIS — D649 Anemia, unspecified: Secondary | ICD-10-CM | POA: Diagnosis not present

## 2018-06-02 DIAGNOSIS — E039 Hypothyroidism, unspecified: Secondary | ICD-10-CM | POA: Diagnosis not present

## 2018-06-02 DIAGNOSIS — M069 Rheumatoid arthritis, unspecified: Secondary | ICD-10-CM | POA: Diagnosis not present

## 2018-06-02 DIAGNOSIS — J449 Chronic obstructive pulmonary disease, unspecified: Secondary | ICD-10-CM | POA: Diagnosis not present

## 2018-06-02 DIAGNOSIS — M109 Gout, unspecified: Secondary | ICD-10-CM | POA: Diagnosis not present

## 2018-06-02 DIAGNOSIS — Z8673 Personal history of transient ischemic attack (TIA), and cerebral infarction without residual deficits: Secondary | ICD-10-CM | POA: Diagnosis not present

## 2018-06-02 DIAGNOSIS — S32501D Unspecified fracture of right pubis, subsequent encounter for fracture with routine healing: Secondary | ICD-10-CM | POA: Diagnosis not present

## 2018-06-02 DIAGNOSIS — Z9181 History of falling: Secondary | ICD-10-CM | POA: Diagnosis not present

## 2018-06-02 DIAGNOSIS — S32471D Displaced fracture of medial wall of right acetabulum, subsequent encounter for fracture with routine healing: Secondary | ICD-10-CM | POA: Diagnosis not present

## 2018-06-03 DIAGNOSIS — S3210XD Unspecified fracture of sacrum, subsequent encounter for fracture with routine healing: Secondary | ICD-10-CM | POA: Diagnosis not present

## 2018-06-03 DIAGNOSIS — Z7984 Long term (current) use of oral hypoglycemic drugs: Secondary | ICD-10-CM | POA: Diagnosis not present

## 2018-06-03 DIAGNOSIS — I5032 Chronic diastolic (congestive) heart failure: Secondary | ICD-10-CM | POA: Diagnosis not present

## 2018-06-03 DIAGNOSIS — Z8673 Personal history of transient ischemic attack (TIA), and cerebral infarction without residual deficits: Secondary | ICD-10-CM | POA: Diagnosis not present

## 2018-06-03 DIAGNOSIS — Z7982 Long term (current) use of aspirin: Secondary | ICD-10-CM | POA: Diagnosis not present

## 2018-06-03 DIAGNOSIS — E119 Type 2 diabetes mellitus without complications: Secondary | ICD-10-CM | POA: Diagnosis not present

## 2018-06-03 DIAGNOSIS — I110 Hypertensive heart disease with heart failure: Secondary | ICD-10-CM | POA: Diagnosis not present

## 2018-06-03 DIAGNOSIS — I25118 Atherosclerotic heart disease of native coronary artery with other forms of angina pectoris: Secondary | ICD-10-CM | POA: Diagnosis not present

## 2018-06-03 DIAGNOSIS — S32501D Unspecified fracture of right pubis, subsequent encounter for fracture with routine healing: Secondary | ICD-10-CM | POA: Diagnosis not present

## 2018-06-03 DIAGNOSIS — Z9181 History of falling: Secondary | ICD-10-CM | POA: Diagnosis not present

## 2018-06-03 DIAGNOSIS — M109 Gout, unspecified: Secondary | ICD-10-CM | POA: Diagnosis not present

## 2018-06-03 DIAGNOSIS — S32471D Displaced fracture of medial wall of right acetabulum, subsequent encounter for fracture with routine healing: Secondary | ICD-10-CM | POA: Diagnosis not present

## 2018-06-03 DIAGNOSIS — M069 Rheumatoid arthritis, unspecified: Secondary | ICD-10-CM | POA: Diagnosis not present

## 2018-06-03 DIAGNOSIS — D649 Anemia, unspecified: Secondary | ICD-10-CM | POA: Diagnosis not present

## 2018-06-03 DIAGNOSIS — E039 Hypothyroidism, unspecified: Secondary | ICD-10-CM | POA: Diagnosis not present

## 2018-06-03 DIAGNOSIS — J449 Chronic obstructive pulmonary disease, unspecified: Secondary | ICD-10-CM | POA: Diagnosis not present

## 2018-06-04 DIAGNOSIS — E119 Type 2 diabetes mellitus without complications: Secondary | ICD-10-CM | POA: Diagnosis not present

## 2018-06-04 DIAGNOSIS — M069 Rheumatoid arthritis, unspecified: Secondary | ICD-10-CM | POA: Diagnosis not present

## 2018-06-04 DIAGNOSIS — D649 Anemia, unspecified: Secondary | ICD-10-CM | POA: Diagnosis not present

## 2018-06-04 DIAGNOSIS — I25118 Atherosclerotic heart disease of native coronary artery with other forms of angina pectoris: Secondary | ICD-10-CM | POA: Diagnosis not present

## 2018-06-04 DIAGNOSIS — Z8673 Personal history of transient ischemic attack (TIA), and cerebral infarction without residual deficits: Secondary | ICD-10-CM | POA: Diagnosis not present

## 2018-06-04 DIAGNOSIS — I5032 Chronic diastolic (congestive) heart failure: Secondary | ICD-10-CM | POA: Diagnosis not present

## 2018-06-04 DIAGNOSIS — Z7984 Long term (current) use of oral hypoglycemic drugs: Secondary | ICD-10-CM | POA: Diagnosis not present

## 2018-06-04 DIAGNOSIS — I110 Hypertensive heart disease with heart failure: Secondary | ICD-10-CM | POA: Diagnosis not present

## 2018-06-04 DIAGNOSIS — S3210XD Unspecified fracture of sacrum, subsequent encounter for fracture with routine healing: Secondary | ICD-10-CM | POA: Diagnosis not present

## 2018-06-04 DIAGNOSIS — S32501D Unspecified fracture of right pubis, subsequent encounter for fracture with routine healing: Secondary | ICD-10-CM | POA: Diagnosis not present

## 2018-06-04 DIAGNOSIS — E039 Hypothyroidism, unspecified: Secondary | ICD-10-CM | POA: Diagnosis not present

## 2018-06-04 DIAGNOSIS — Z7982 Long term (current) use of aspirin: Secondary | ICD-10-CM | POA: Diagnosis not present

## 2018-06-04 DIAGNOSIS — S32471D Displaced fracture of medial wall of right acetabulum, subsequent encounter for fracture with routine healing: Secondary | ICD-10-CM | POA: Diagnosis not present

## 2018-06-04 DIAGNOSIS — M109 Gout, unspecified: Secondary | ICD-10-CM | POA: Diagnosis not present

## 2018-06-04 DIAGNOSIS — J449 Chronic obstructive pulmonary disease, unspecified: Secondary | ICD-10-CM | POA: Diagnosis not present

## 2018-06-04 DIAGNOSIS — Z9181 History of falling: Secondary | ICD-10-CM | POA: Diagnosis not present

## 2018-06-05 DIAGNOSIS — Z7982 Long term (current) use of aspirin: Secondary | ICD-10-CM | POA: Diagnosis not present

## 2018-06-05 DIAGNOSIS — I110 Hypertensive heart disease with heart failure: Secondary | ICD-10-CM | POA: Diagnosis not present

## 2018-06-05 DIAGNOSIS — D649 Anemia, unspecified: Secondary | ICD-10-CM | POA: Diagnosis not present

## 2018-06-05 DIAGNOSIS — E039 Hypothyroidism, unspecified: Secondary | ICD-10-CM | POA: Diagnosis not present

## 2018-06-05 DIAGNOSIS — Z8673 Personal history of transient ischemic attack (TIA), and cerebral infarction without residual deficits: Secondary | ICD-10-CM | POA: Diagnosis not present

## 2018-06-05 DIAGNOSIS — S32501D Unspecified fracture of right pubis, subsequent encounter for fracture with routine healing: Secondary | ICD-10-CM | POA: Diagnosis not present

## 2018-06-05 DIAGNOSIS — I5032 Chronic diastolic (congestive) heart failure: Secondary | ICD-10-CM | POA: Diagnosis not present

## 2018-06-05 DIAGNOSIS — Z9181 History of falling: Secondary | ICD-10-CM | POA: Diagnosis not present

## 2018-06-05 DIAGNOSIS — E119 Type 2 diabetes mellitus without complications: Secondary | ICD-10-CM | POA: Diagnosis not present

## 2018-06-05 DIAGNOSIS — I25118 Atherosclerotic heart disease of native coronary artery with other forms of angina pectoris: Secondary | ICD-10-CM | POA: Diagnosis not present

## 2018-06-05 DIAGNOSIS — S3210XD Unspecified fracture of sacrum, subsequent encounter for fracture with routine healing: Secondary | ICD-10-CM | POA: Diagnosis not present

## 2018-06-05 DIAGNOSIS — J449 Chronic obstructive pulmonary disease, unspecified: Secondary | ICD-10-CM | POA: Diagnosis not present

## 2018-06-05 DIAGNOSIS — M069 Rheumatoid arthritis, unspecified: Secondary | ICD-10-CM | POA: Diagnosis not present

## 2018-06-05 DIAGNOSIS — Z7984 Long term (current) use of oral hypoglycemic drugs: Secondary | ICD-10-CM | POA: Diagnosis not present

## 2018-06-05 DIAGNOSIS — S32471D Displaced fracture of medial wall of right acetabulum, subsequent encounter for fracture with routine healing: Secondary | ICD-10-CM | POA: Diagnosis not present

## 2018-06-05 DIAGNOSIS — M109 Gout, unspecified: Secondary | ICD-10-CM | POA: Diagnosis not present

## 2018-06-08 DIAGNOSIS — I5032 Chronic diastolic (congestive) heart failure: Secondary | ICD-10-CM | POA: Diagnosis not present

## 2018-06-08 DIAGNOSIS — Z7982 Long term (current) use of aspirin: Secondary | ICD-10-CM | POA: Diagnosis not present

## 2018-06-08 DIAGNOSIS — M069 Rheumatoid arthritis, unspecified: Secondary | ICD-10-CM | POA: Diagnosis not present

## 2018-06-08 DIAGNOSIS — E039 Hypothyroidism, unspecified: Secondary | ICD-10-CM | POA: Diagnosis not present

## 2018-06-08 DIAGNOSIS — E119 Type 2 diabetes mellitus without complications: Secondary | ICD-10-CM | POA: Diagnosis not present

## 2018-06-08 DIAGNOSIS — I25118 Atherosclerotic heart disease of native coronary artery with other forms of angina pectoris: Secondary | ICD-10-CM | POA: Diagnosis not present

## 2018-06-08 DIAGNOSIS — D649 Anemia, unspecified: Secondary | ICD-10-CM | POA: Diagnosis not present

## 2018-06-08 DIAGNOSIS — S32471D Displaced fracture of medial wall of right acetabulum, subsequent encounter for fracture with routine healing: Secondary | ICD-10-CM | POA: Diagnosis not present

## 2018-06-08 DIAGNOSIS — Z9181 History of falling: Secondary | ICD-10-CM | POA: Diagnosis not present

## 2018-06-08 DIAGNOSIS — Z7984 Long term (current) use of oral hypoglycemic drugs: Secondary | ICD-10-CM | POA: Diagnosis not present

## 2018-06-08 DIAGNOSIS — S3210XD Unspecified fracture of sacrum, subsequent encounter for fracture with routine healing: Secondary | ICD-10-CM | POA: Diagnosis not present

## 2018-06-08 DIAGNOSIS — J449 Chronic obstructive pulmonary disease, unspecified: Secondary | ICD-10-CM | POA: Diagnosis not present

## 2018-06-08 DIAGNOSIS — M109 Gout, unspecified: Secondary | ICD-10-CM | POA: Diagnosis not present

## 2018-06-08 DIAGNOSIS — Z8673 Personal history of transient ischemic attack (TIA), and cerebral infarction without residual deficits: Secondary | ICD-10-CM | POA: Diagnosis not present

## 2018-06-08 DIAGNOSIS — S32501D Unspecified fracture of right pubis, subsequent encounter for fracture with routine healing: Secondary | ICD-10-CM | POA: Diagnosis not present

## 2018-06-08 DIAGNOSIS — I110 Hypertensive heart disease with heart failure: Secondary | ICD-10-CM | POA: Diagnosis not present

## 2018-06-09 DIAGNOSIS — E119 Type 2 diabetes mellitus without complications: Secondary | ICD-10-CM | POA: Diagnosis not present

## 2018-06-09 DIAGNOSIS — I5032 Chronic diastolic (congestive) heart failure: Secondary | ICD-10-CM | POA: Diagnosis not present

## 2018-06-09 DIAGNOSIS — S3210XD Unspecified fracture of sacrum, subsequent encounter for fracture with routine healing: Secondary | ICD-10-CM | POA: Diagnosis not present

## 2018-06-09 DIAGNOSIS — M069 Rheumatoid arthritis, unspecified: Secondary | ICD-10-CM | POA: Diagnosis not present

## 2018-06-09 DIAGNOSIS — E039 Hypothyroidism, unspecified: Secondary | ICD-10-CM | POA: Diagnosis not present

## 2018-06-09 DIAGNOSIS — Z8673 Personal history of transient ischemic attack (TIA), and cerebral infarction without residual deficits: Secondary | ICD-10-CM | POA: Diagnosis not present

## 2018-06-09 DIAGNOSIS — Z7984 Long term (current) use of oral hypoglycemic drugs: Secondary | ICD-10-CM | POA: Diagnosis not present

## 2018-06-09 DIAGNOSIS — M109 Gout, unspecified: Secondary | ICD-10-CM | POA: Diagnosis not present

## 2018-06-09 DIAGNOSIS — Z9181 History of falling: Secondary | ICD-10-CM | POA: Diagnosis not present

## 2018-06-09 DIAGNOSIS — S32501D Unspecified fracture of right pubis, subsequent encounter for fracture with routine healing: Secondary | ICD-10-CM | POA: Diagnosis not present

## 2018-06-09 DIAGNOSIS — Z7982 Long term (current) use of aspirin: Secondary | ICD-10-CM | POA: Diagnosis not present

## 2018-06-09 DIAGNOSIS — I25118 Atherosclerotic heart disease of native coronary artery with other forms of angina pectoris: Secondary | ICD-10-CM | POA: Diagnosis not present

## 2018-06-09 DIAGNOSIS — D649 Anemia, unspecified: Secondary | ICD-10-CM | POA: Diagnosis not present

## 2018-06-09 DIAGNOSIS — J449 Chronic obstructive pulmonary disease, unspecified: Secondary | ICD-10-CM | POA: Diagnosis not present

## 2018-06-09 DIAGNOSIS — I110 Hypertensive heart disease with heart failure: Secondary | ICD-10-CM | POA: Diagnosis not present

## 2018-06-09 DIAGNOSIS — S32471D Displaced fracture of medial wall of right acetabulum, subsequent encounter for fracture with routine healing: Secondary | ICD-10-CM | POA: Diagnosis not present

## 2018-06-10 ENCOUNTER — Ambulatory Visit: Payer: Medicare Other | Admitting: Sports Medicine

## 2018-06-11 DIAGNOSIS — S32501D Unspecified fracture of right pubis, subsequent encounter for fracture with routine healing: Secondary | ICD-10-CM | POA: Diagnosis not present

## 2018-06-11 DIAGNOSIS — Z7982 Long term (current) use of aspirin: Secondary | ICD-10-CM | POA: Diagnosis not present

## 2018-06-11 DIAGNOSIS — I5032 Chronic diastolic (congestive) heart failure: Secondary | ICD-10-CM | POA: Diagnosis not present

## 2018-06-11 DIAGNOSIS — I25118 Atherosclerotic heart disease of native coronary artery with other forms of angina pectoris: Secondary | ICD-10-CM | POA: Diagnosis not present

## 2018-06-11 DIAGNOSIS — M069 Rheumatoid arthritis, unspecified: Secondary | ICD-10-CM | POA: Diagnosis not present

## 2018-06-11 DIAGNOSIS — I110 Hypertensive heart disease with heart failure: Secondary | ICD-10-CM | POA: Diagnosis not present

## 2018-06-11 DIAGNOSIS — Z9181 History of falling: Secondary | ICD-10-CM | POA: Diagnosis not present

## 2018-06-11 DIAGNOSIS — E039 Hypothyroidism, unspecified: Secondary | ICD-10-CM | POA: Diagnosis not present

## 2018-06-11 DIAGNOSIS — J449 Chronic obstructive pulmonary disease, unspecified: Secondary | ICD-10-CM | POA: Diagnosis not present

## 2018-06-11 DIAGNOSIS — M109 Gout, unspecified: Secondary | ICD-10-CM | POA: Diagnosis not present

## 2018-06-11 DIAGNOSIS — S3210XD Unspecified fracture of sacrum, subsequent encounter for fracture with routine healing: Secondary | ICD-10-CM | POA: Diagnosis not present

## 2018-06-11 DIAGNOSIS — Z8673 Personal history of transient ischemic attack (TIA), and cerebral infarction without residual deficits: Secondary | ICD-10-CM | POA: Diagnosis not present

## 2018-06-11 DIAGNOSIS — D649 Anemia, unspecified: Secondary | ICD-10-CM | POA: Diagnosis not present

## 2018-06-11 DIAGNOSIS — S32471D Displaced fracture of medial wall of right acetabulum, subsequent encounter for fracture with routine healing: Secondary | ICD-10-CM | POA: Diagnosis not present

## 2018-06-11 DIAGNOSIS — Z7984 Long term (current) use of oral hypoglycemic drugs: Secondary | ICD-10-CM | POA: Diagnosis not present

## 2018-06-11 DIAGNOSIS — E119 Type 2 diabetes mellitus without complications: Secondary | ICD-10-CM | POA: Diagnosis not present

## 2018-06-12 DIAGNOSIS — Z9181 History of falling: Secondary | ICD-10-CM | POA: Diagnosis not present

## 2018-06-12 DIAGNOSIS — Z7984 Long term (current) use of oral hypoglycemic drugs: Secondary | ICD-10-CM | POA: Diagnosis not present

## 2018-06-12 DIAGNOSIS — Z8673 Personal history of transient ischemic attack (TIA), and cerebral infarction without residual deficits: Secondary | ICD-10-CM | POA: Diagnosis not present

## 2018-06-12 DIAGNOSIS — I5032 Chronic diastolic (congestive) heart failure: Secondary | ICD-10-CM | POA: Diagnosis not present

## 2018-06-12 DIAGNOSIS — I25118 Atherosclerotic heart disease of native coronary artery with other forms of angina pectoris: Secondary | ICD-10-CM | POA: Diagnosis not present

## 2018-06-12 DIAGNOSIS — Z7982 Long term (current) use of aspirin: Secondary | ICD-10-CM | POA: Diagnosis not present

## 2018-06-12 DIAGNOSIS — S3210XD Unspecified fracture of sacrum, subsequent encounter for fracture with routine healing: Secondary | ICD-10-CM | POA: Diagnosis not present

## 2018-06-12 DIAGNOSIS — J449 Chronic obstructive pulmonary disease, unspecified: Secondary | ICD-10-CM | POA: Diagnosis not present

## 2018-06-12 DIAGNOSIS — D649 Anemia, unspecified: Secondary | ICD-10-CM | POA: Diagnosis not present

## 2018-06-12 DIAGNOSIS — I110 Hypertensive heart disease with heart failure: Secondary | ICD-10-CM | POA: Diagnosis not present

## 2018-06-12 DIAGNOSIS — M109 Gout, unspecified: Secondary | ICD-10-CM | POA: Diagnosis not present

## 2018-06-12 DIAGNOSIS — S32501D Unspecified fracture of right pubis, subsequent encounter for fracture with routine healing: Secondary | ICD-10-CM | POA: Diagnosis not present

## 2018-06-12 DIAGNOSIS — E039 Hypothyroidism, unspecified: Secondary | ICD-10-CM | POA: Diagnosis not present

## 2018-06-12 DIAGNOSIS — S32471D Displaced fracture of medial wall of right acetabulum, subsequent encounter for fracture with routine healing: Secondary | ICD-10-CM | POA: Diagnosis not present

## 2018-06-12 DIAGNOSIS — E119 Type 2 diabetes mellitus without complications: Secondary | ICD-10-CM | POA: Diagnosis not present

## 2018-06-12 DIAGNOSIS — M069 Rheumatoid arthritis, unspecified: Secondary | ICD-10-CM | POA: Diagnosis not present

## 2018-06-15 DIAGNOSIS — J449 Chronic obstructive pulmonary disease, unspecified: Secondary | ICD-10-CM | POA: Diagnosis not present

## 2018-06-15 DIAGNOSIS — Z7982 Long term (current) use of aspirin: Secondary | ICD-10-CM | POA: Diagnosis not present

## 2018-06-15 DIAGNOSIS — E119 Type 2 diabetes mellitus without complications: Secondary | ICD-10-CM | POA: Diagnosis not present

## 2018-06-15 DIAGNOSIS — M109 Gout, unspecified: Secondary | ICD-10-CM | POA: Diagnosis not present

## 2018-06-15 DIAGNOSIS — I5032 Chronic diastolic (congestive) heart failure: Secondary | ICD-10-CM | POA: Diagnosis not present

## 2018-06-15 DIAGNOSIS — E039 Hypothyroidism, unspecified: Secondary | ICD-10-CM | POA: Diagnosis not present

## 2018-06-15 DIAGNOSIS — Z7984 Long term (current) use of oral hypoglycemic drugs: Secondary | ICD-10-CM | POA: Diagnosis not present

## 2018-06-15 DIAGNOSIS — I110 Hypertensive heart disease with heart failure: Secondary | ICD-10-CM | POA: Diagnosis not present

## 2018-06-15 DIAGNOSIS — Z8673 Personal history of transient ischemic attack (TIA), and cerebral infarction without residual deficits: Secondary | ICD-10-CM | POA: Diagnosis not present

## 2018-06-15 DIAGNOSIS — M069 Rheumatoid arthritis, unspecified: Secondary | ICD-10-CM | POA: Diagnosis not present

## 2018-06-15 DIAGNOSIS — S3210XD Unspecified fracture of sacrum, subsequent encounter for fracture with routine healing: Secondary | ICD-10-CM | POA: Diagnosis not present

## 2018-06-15 DIAGNOSIS — D649 Anemia, unspecified: Secondary | ICD-10-CM | POA: Diagnosis not present

## 2018-06-15 DIAGNOSIS — S32471D Displaced fracture of medial wall of right acetabulum, subsequent encounter for fracture with routine healing: Secondary | ICD-10-CM | POA: Diagnosis not present

## 2018-06-15 DIAGNOSIS — S32501D Unspecified fracture of right pubis, subsequent encounter for fracture with routine healing: Secondary | ICD-10-CM | POA: Diagnosis not present

## 2018-06-15 DIAGNOSIS — I25118 Atherosclerotic heart disease of native coronary artery with other forms of angina pectoris: Secondary | ICD-10-CM | POA: Diagnosis not present

## 2018-06-15 DIAGNOSIS — Z9181 History of falling: Secondary | ICD-10-CM | POA: Diagnosis not present

## 2018-06-16 DIAGNOSIS — D649 Anemia, unspecified: Secondary | ICD-10-CM | POA: Diagnosis not present

## 2018-06-16 DIAGNOSIS — Z7982 Long term (current) use of aspirin: Secondary | ICD-10-CM | POA: Diagnosis not present

## 2018-06-16 DIAGNOSIS — S32471D Displaced fracture of medial wall of right acetabulum, subsequent encounter for fracture with routine healing: Secondary | ICD-10-CM | POA: Diagnosis not present

## 2018-06-16 DIAGNOSIS — I110 Hypertensive heart disease with heart failure: Secondary | ICD-10-CM | POA: Diagnosis not present

## 2018-06-16 DIAGNOSIS — M069 Rheumatoid arthritis, unspecified: Secondary | ICD-10-CM | POA: Diagnosis not present

## 2018-06-16 DIAGNOSIS — E119 Type 2 diabetes mellitus without complications: Secondary | ICD-10-CM | POA: Diagnosis not present

## 2018-06-16 DIAGNOSIS — S32501D Unspecified fracture of right pubis, subsequent encounter for fracture with routine healing: Secondary | ICD-10-CM | POA: Diagnosis not present

## 2018-06-16 DIAGNOSIS — I5032 Chronic diastolic (congestive) heart failure: Secondary | ICD-10-CM | POA: Diagnosis not present

## 2018-06-16 DIAGNOSIS — M6281 Muscle weakness (generalized): Secondary | ICD-10-CM | POA: Diagnosis not present

## 2018-06-16 DIAGNOSIS — J449 Chronic obstructive pulmonary disease, unspecified: Secondary | ICD-10-CM | POA: Diagnosis not present

## 2018-06-16 DIAGNOSIS — Z8673 Personal history of transient ischemic attack (TIA), and cerebral infarction without residual deficits: Secondary | ICD-10-CM | POA: Diagnosis not present

## 2018-06-16 DIAGNOSIS — M109 Gout, unspecified: Secondary | ICD-10-CM | POA: Diagnosis not present

## 2018-06-16 DIAGNOSIS — Z9181 History of falling: Secondary | ICD-10-CM | POA: Diagnosis not present

## 2018-06-16 DIAGNOSIS — Z7984 Long term (current) use of oral hypoglycemic drugs: Secondary | ICD-10-CM | POA: Diagnosis not present

## 2018-06-16 DIAGNOSIS — I25118 Atherosclerotic heart disease of native coronary artery with other forms of angina pectoris: Secondary | ICD-10-CM | POA: Diagnosis not present

## 2018-06-16 DIAGNOSIS — S3210XD Unspecified fracture of sacrum, subsequent encounter for fracture with routine healing: Secondary | ICD-10-CM | POA: Diagnosis not present

## 2018-06-16 DIAGNOSIS — E039 Hypothyroidism, unspecified: Secondary | ICD-10-CM | POA: Diagnosis not present

## 2018-06-17 DIAGNOSIS — S32501D Unspecified fracture of right pubis, subsequent encounter for fracture with routine healing: Secondary | ICD-10-CM | POA: Diagnosis not present

## 2018-06-17 DIAGNOSIS — S32471D Displaced fracture of medial wall of right acetabulum, subsequent encounter for fracture with routine healing: Secondary | ICD-10-CM | POA: Diagnosis not present

## 2018-06-17 DIAGNOSIS — Z9181 History of falling: Secondary | ICD-10-CM | POA: Diagnosis not present

## 2018-06-17 DIAGNOSIS — S3210XD Unspecified fracture of sacrum, subsequent encounter for fracture with routine healing: Secondary | ICD-10-CM | POA: Diagnosis not present

## 2018-06-17 DIAGNOSIS — E119 Type 2 diabetes mellitus without complications: Secondary | ICD-10-CM | POA: Diagnosis not present

## 2018-06-17 DIAGNOSIS — I5032 Chronic diastolic (congestive) heart failure: Secondary | ICD-10-CM | POA: Diagnosis not present

## 2018-06-17 DIAGNOSIS — Z7984 Long term (current) use of oral hypoglycemic drugs: Secondary | ICD-10-CM | POA: Diagnosis not present

## 2018-06-17 DIAGNOSIS — Z8673 Personal history of transient ischemic attack (TIA), and cerebral infarction without residual deficits: Secondary | ICD-10-CM | POA: Diagnosis not present

## 2018-06-17 DIAGNOSIS — E039 Hypothyroidism, unspecified: Secondary | ICD-10-CM | POA: Diagnosis not present

## 2018-06-17 DIAGNOSIS — I25118 Atherosclerotic heart disease of native coronary artery with other forms of angina pectoris: Secondary | ICD-10-CM | POA: Diagnosis not present

## 2018-06-17 DIAGNOSIS — I110 Hypertensive heart disease with heart failure: Secondary | ICD-10-CM | POA: Diagnosis not present

## 2018-06-17 DIAGNOSIS — Z7982 Long term (current) use of aspirin: Secondary | ICD-10-CM | POA: Diagnosis not present

## 2018-06-17 DIAGNOSIS — M069 Rheumatoid arthritis, unspecified: Secondary | ICD-10-CM | POA: Diagnosis not present

## 2018-06-17 DIAGNOSIS — D649 Anemia, unspecified: Secondary | ICD-10-CM | POA: Diagnosis not present

## 2018-06-17 DIAGNOSIS — M109 Gout, unspecified: Secondary | ICD-10-CM | POA: Diagnosis not present

## 2018-06-17 DIAGNOSIS — J449 Chronic obstructive pulmonary disease, unspecified: Secondary | ICD-10-CM | POA: Diagnosis not present

## 2018-06-18 DIAGNOSIS — Z9181 History of falling: Secondary | ICD-10-CM | POA: Diagnosis not present

## 2018-06-18 DIAGNOSIS — E039 Hypothyroidism, unspecified: Secondary | ICD-10-CM | POA: Diagnosis not present

## 2018-06-18 DIAGNOSIS — M069 Rheumatoid arthritis, unspecified: Secondary | ICD-10-CM | POA: Diagnosis not present

## 2018-06-18 DIAGNOSIS — J449 Chronic obstructive pulmonary disease, unspecified: Secondary | ICD-10-CM | POA: Diagnosis not present

## 2018-06-18 DIAGNOSIS — I5032 Chronic diastolic (congestive) heart failure: Secondary | ICD-10-CM | POA: Diagnosis not present

## 2018-06-18 DIAGNOSIS — Z7982 Long term (current) use of aspirin: Secondary | ICD-10-CM | POA: Diagnosis not present

## 2018-06-18 DIAGNOSIS — I25118 Atherosclerotic heart disease of native coronary artery with other forms of angina pectoris: Secondary | ICD-10-CM | POA: Diagnosis not present

## 2018-06-18 DIAGNOSIS — Z8673 Personal history of transient ischemic attack (TIA), and cerebral infarction without residual deficits: Secondary | ICD-10-CM | POA: Diagnosis not present

## 2018-06-18 DIAGNOSIS — S32471D Displaced fracture of medial wall of right acetabulum, subsequent encounter for fracture with routine healing: Secondary | ICD-10-CM | POA: Diagnosis not present

## 2018-06-18 DIAGNOSIS — I110 Hypertensive heart disease with heart failure: Secondary | ICD-10-CM | POA: Diagnosis not present

## 2018-06-18 DIAGNOSIS — Z7984 Long term (current) use of oral hypoglycemic drugs: Secondary | ICD-10-CM | POA: Diagnosis not present

## 2018-06-18 DIAGNOSIS — D649 Anemia, unspecified: Secondary | ICD-10-CM | POA: Diagnosis not present

## 2018-06-18 DIAGNOSIS — S32501D Unspecified fracture of right pubis, subsequent encounter for fracture with routine healing: Secondary | ICD-10-CM | POA: Diagnosis not present

## 2018-06-18 DIAGNOSIS — E119 Type 2 diabetes mellitus without complications: Secondary | ICD-10-CM | POA: Diagnosis not present

## 2018-06-18 DIAGNOSIS — S3210XD Unspecified fracture of sacrum, subsequent encounter for fracture with routine healing: Secondary | ICD-10-CM | POA: Diagnosis not present

## 2018-06-18 DIAGNOSIS — M109 Gout, unspecified: Secondary | ICD-10-CM | POA: Diagnosis not present

## 2018-07-01 DIAGNOSIS — S32434D Nondisplaced fracture of anterior column [iliopubic] of right acetabulum, subsequent encounter for fracture with routine healing: Secondary | ICD-10-CM | POA: Diagnosis not present

## 2018-07-01 DIAGNOSIS — S32591D Other specified fracture of right pubis, subsequent encounter for fracture with routine healing: Secondary | ICD-10-CM | POA: Diagnosis not present

## 2018-07-01 DIAGNOSIS — S3210XD Unspecified fracture of sacrum, subsequent encounter for fracture with routine healing: Secondary | ICD-10-CM | POA: Diagnosis not present

## 2018-07-08 ENCOUNTER — Encounter: Payer: Self-pay | Admitting: Sports Medicine

## 2018-07-08 ENCOUNTER — Other Ambulatory Visit: Payer: Self-pay

## 2018-07-08 ENCOUNTER — Ambulatory Visit: Payer: Medicare Other | Admitting: Sports Medicine

## 2018-07-08 VITALS — Temp 97.7°F | Resp 16 | Ht 62.0 in | Wt 164.0 lb

## 2018-07-08 DIAGNOSIS — I739 Peripheral vascular disease, unspecified: Secondary | ICD-10-CM

## 2018-07-08 DIAGNOSIS — M79674 Pain in right toe(s): Secondary | ICD-10-CM | POA: Diagnosis not present

## 2018-07-08 DIAGNOSIS — B351 Tinea unguium: Secondary | ICD-10-CM

## 2018-07-08 DIAGNOSIS — E1142 Type 2 diabetes mellitus with diabetic polyneuropathy: Secondary | ICD-10-CM

## 2018-07-08 DIAGNOSIS — M79675 Pain in left toe(s): Secondary | ICD-10-CM

## 2018-07-08 NOTE — Progress Notes (Signed)
   Subjective:    Patient ID: Casey Humphrey, female    DOB: May 01, 1945, 73 y.o.   MRN: [id number]  HPI    Review of Systems  All other systems reviewed and are negative.      Objective:   Physical Exam        Assessment & Plan:

## 2018-07-08 NOTE — Progress Notes (Signed)
Subjective: Casey Humphrey is a 73 y.o. female patient with history of diabetes who presents to office today complaining of long,mildly painful nails  while ambulating in shoes; unable to trim. Patient states that the corners of her big toes are sore and it seems like her left fifth toenail is loose.  Patient reports her glucose reading this morning was 112 mg/dl and does not know her last A1c.  Patient reports that she last went to her primary care doctor back in October. Patient denies any new changes in medication or new problems.  Patient is assisted by niece at this visit who helps to report patient history.  Review of Systems  All other systems reviewed and are negative.    Patient Active Problem List   Diagnosis Date Noted  . Asthma 01/28/2018  . Cold extremities 11/04/2017  . Hypertensive heart disease with heart failure ([place]) 10/14/2017  . Mild CAD 10/14/2017  . Type 2 diabetes mellitus ([place]) 10/14/2017  . Chest pain 10/14/2017  . Chronic diastolic heart failure ([place]) 07/25/2015  . Hyperlipidemia 07/25/2015  . Pancreatic mass 04/08/2013   Current Outpatient Medications on File Prior to Visit  Medication Sig Dispense Refill  . albuterol (PROVENTIL HFA;VENTOLIN HFA) 108 (90 Base) MCG/ACT inhaler Inhale 2 puffs into the lungs every 6 (six) hours as needed for wheezing or shortness of breath. 1 Inhaler 2  . aspirin EC 81 MG tablet Take 81 mg by mouth daily.    Marland Kitchen ipratropium (ATROVENT HFA) 17 MCG/ACT inhaler Inhale 2 puffs into the lungs every 6 (six) hours.    Marland Kitchen levothyroxine (SYNTHROID, LEVOTHROID) 75 MCG tablet Take 75 mcg by mouth daily before breakfast.     . metFORMIN (GLUCOPHAGE-XR) 500 MG 24 hr tablet Take 500 mg by mouth daily with breakfast.    . Methylcellulose, Laxative, (CITRUCEL PO) Take 1 capsule by mouth daily.    . polycarbophil (FIBERCON) 625 MG tablet Take 625 mg by mouth daily.    . propranolol (INDERAL) 20 MG tablet TK 1 T PO BID  0  . spironolactone (ALDACTONE)  25 MG tablet TK 0.5 T PO QAM  1  . torsemide (DEMADEX) 20 MG tablet Take 1 tablet (20 mg total) by mouth 2 (two) times daily. Take an extra 20 mg (1 tablet) in the AM if your weight is 168 or greater. 180 tablet 1   No current facility-administered medications on file prior to visit.    Allergies  Allergen Reactions  . Codeine Nausea And Vomiting  . Quinine Derivatives Nausea And Vomiting  . Adhesive [Tape] Rash  . Penicillins Rash  . Procardia [Nifedipine] Rash    No results found for this or any previous visit (from the past 2160 hour(s)).  Objective: General: Patient is awake, alert, and oriented x 3 and in no acute distress.  Integument: Skin is warm, dry and supple bilateral. Nails are tender, long, thickened and  dystrophic with subungual debris, consistent with onychomycosis, 1-5 bilateral with incurvation and bilateral hallux margins. No signs of acute infection. No open lesions or preulcerative lesions present bilateral. Remaining integument unremarkable.  Vasculature:  Dorsalis Pedis pulse 1/4 bilateral. Posterior Tibial pulse  0/4 bilateral.  Capillary fill time <5 sec 1-5 bilateral. No hair growth to the level of the digits. Temperature gradient within normal limits. No varicosities present bilateral. Trace edema present bilateral.   Neurology: The patient has severely diminished sensation measured with a 5.07/10g Semmes Weinstein Monofilament at all pedal sites bilateral . Vibratory sensation diminished  bilateral with tuning fork. No Babinski sign present bilateral.   Musculoskeletal: Asymptomatic hammertoe pedal deformities noted bilateral. Muscular strength 4/5 in all lower extremity muscular groups bilateral without pain on range of motion . No tenderness with calf compression bilateral.  Assessment and Plan: Problem List Items Addressed This Visit    None    Visit Diagnoses    Pain due to onychomycosis of toenails of both feet    -  Primary   Diabetic  polyneuropathy associated with type 2 diabetes mellitus (HCC)       PVD (peripheral vascular disease) ([place])          -Examined patient. -Discussed and educated patient on diabetic foot care, especially with  regards to the vascular, neurological and musculoskeletal systems.  -Stressed the importance of good glycemic control and the detriment of not  controlling glucose levels in relation to the foot. -Mechanically debrided all nails 1-5 bilateral using sterile nail nipper and filed with dremel -Advised patient that she can use as needed antibiotic cream to any sore areas to her great toes today I applied some antibiotic cream because there was some mild bleeding after trimming her nails at both bilateral hallux nail corners -Answered all patient questions -Patient to return  in 3 months for at risk foot care -Patient advised to call the office if any problems or questions arise in the meantime.  Landis Martins, DPM

## 2018-07-16 DIAGNOSIS — I5032 Chronic diastolic (congestive) heart failure: Secondary | ICD-10-CM | POA: Diagnosis not present

## 2018-07-16 DIAGNOSIS — I10 Essential (primary) hypertension: Secondary | ICD-10-CM | POA: Diagnosis not present

## 2018-07-16 DIAGNOSIS — E1165 Type 2 diabetes mellitus with hyperglycemia: Secondary | ICD-10-CM | POA: Diagnosis not present

## 2018-07-16 DIAGNOSIS — M069 Rheumatoid arthritis, unspecified: Secondary | ICD-10-CM | POA: Diagnosis not present

## 2018-07-16 DIAGNOSIS — M6281 Muscle weakness (generalized): Secondary | ICD-10-CM | POA: Diagnosis not present

## 2018-07-16 DIAGNOSIS — J449 Chronic obstructive pulmonary disease, unspecified: Secondary | ICD-10-CM | POA: Diagnosis not present

## 2018-08-15 DIAGNOSIS — I10 Essential (primary) hypertension: Secondary | ICD-10-CM | POA: Diagnosis not present

## 2018-08-15 DIAGNOSIS — E1143 Type 2 diabetes mellitus with diabetic autonomic (poly)neuropathy: Secondary | ICD-10-CM | POA: Diagnosis not present

## 2018-08-16 DIAGNOSIS — M6281 Muscle weakness (generalized): Secondary | ICD-10-CM | POA: Diagnosis not present

## 2018-08-16 DIAGNOSIS — J449 Chronic obstructive pulmonary disease, unspecified: Secondary | ICD-10-CM | POA: Diagnosis not present

## 2018-08-16 DIAGNOSIS — I5032 Chronic diastolic (congestive) heart failure: Secondary | ICD-10-CM | POA: Diagnosis not present

## 2018-08-16 DIAGNOSIS — M069 Rheumatoid arthritis, unspecified: Secondary | ICD-10-CM | POA: Diagnosis not present

## 2018-08-17 DIAGNOSIS — Z780 Asymptomatic menopausal state: Secondary | ICD-10-CM | POA: Diagnosis not present

## 2018-08-17 DIAGNOSIS — M810 Age-related osteoporosis without current pathological fracture: Secondary | ICD-10-CM | POA: Diagnosis not present

## 2018-08-28 DIAGNOSIS — Z0000 Encounter for general adult medical examination without abnormal findings: Secondary | ICD-10-CM | POA: Diagnosis not present

## 2018-08-28 DIAGNOSIS — E1143 Type 2 diabetes mellitus with diabetic autonomic (poly)neuropathy: Secondary | ICD-10-CM | POA: Diagnosis not present

## 2018-08-28 DIAGNOSIS — I10 Essential (primary) hypertension: Secondary | ICD-10-CM | POA: Diagnosis not present

## 2018-08-28 DIAGNOSIS — E559 Vitamin D deficiency, unspecified: Secondary | ICD-10-CM | POA: Diagnosis not present

## 2018-09-15 DIAGNOSIS — M6281 Muscle weakness (generalized): Secondary | ICD-10-CM | POA: Diagnosis not present

## 2018-09-15 DIAGNOSIS — J449 Chronic obstructive pulmonary disease, unspecified: Secondary | ICD-10-CM | POA: Diagnosis not present

## 2018-09-15 DIAGNOSIS — I5032 Chronic diastolic (congestive) heart failure: Secondary | ICD-10-CM | POA: Diagnosis not present

## 2018-09-15 DIAGNOSIS — M069 Rheumatoid arthritis, unspecified: Secondary | ICD-10-CM | POA: Diagnosis not present

## 2018-10-02 ENCOUNTER — Other Ambulatory Visit: Payer: Self-pay

## 2018-10-02 NOTE — Patient Outreach (Signed)
Outreached regarding care mgmt program.  

## 2018-10-07 ENCOUNTER — Other Ambulatory Visit: Payer: Self-pay

## 2018-10-07 ENCOUNTER — Encounter: Payer: Self-pay | Admitting: Sports Medicine

## 2018-10-07 ENCOUNTER — Ambulatory Visit: Payer: Medicare Other | Admitting: Sports Medicine

## 2018-10-07 DIAGNOSIS — M2041 Other hammer toe(s) (acquired), right foot: Secondary | ICD-10-CM

## 2018-10-07 DIAGNOSIS — M79674 Pain in right toe(s): Secondary | ICD-10-CM

## 2018-10-07 DIAGNOSIS — E1142 Type 2 diabetes mellitus with diabetic polyneuropathy: Secondary | ICD-10-CM

## 2018-10-07 DIAGNOSIS — M79675 Pain in left toe(s): Secondary | ICD-10-CM | POA: Diagnosis not present

## 2018-10-07 DIAGNOSIS — M2042 Other hammer toe(s) (acquired), left foot: Secondary | ICD-10-CM

## 2018-10-07 DIAGNOSIS — M2142 Flat foot [pes planus] (acquired), left foot: Secondary | ICD-10-CM

## 2018-10-07 DIAGNOSIS — B351 Tinea unguium: Secondary | ICD-10-CM

## 2018-10-07 DIAGNOSIS — M2141 Flat foot [pes planus] (acquired), right foot: Secondary | ICD-10-CM

## 2018-10-07 DIAGNOSIS — I739 Peripheral vascular disease, unspecified: Secondary | ICD-10-CM

## 2018-10-07 NOTE — Progress Notes (Signed)
Subjective: Casey Humphrey is a 73 y.o. female patient with history of diabetes who presents to office today complaining of long,mildly painful nails  while ambulating in shoes; unable to trim. Patient states that the corners of her big toes are sore and that she has a itchy spot on her left foot. Patient also wants Diabetic shoes.  Patient reports her glucose reading this morning was 107 mg/dl and does not know her last A1c.  Patient reports that she last went to her primary care doctor back in June and will see again in Sept. Patient denies any new changes in medication or new problems.  Patient Active Problem List   Diagnosis Date Noted  . Asthma 01/28/2018  . Cold extremities 11/04/2017  . Hypertensive heart disease with heart failure ([place]) 10/14/2017  . Mild CAD 10/14/2017  . Type 2 diabetes mellitus ([place]) 10/14/2017  . Chest pain 10/14/2017  . Chronic diastolic heart failure ([place]) 07/25/2015  . Hyperlipidemia 07/25/2015  . Pancreatic mass 04/08/2013   Current Outpatient Medications on File Prior to Visit  Medication Sig Dispense Refill  . albuterol (PROVENTIL HFA;VENTOLIN HFA) 108 (90 Base) MCG/ACT inhaler Inhale 2 puffs into the lungs every 6 (six) hours as needed for wheezing or shortness of breath. 1 Inhaler 2  . aspirin EC 81 MG tablet Take 81 mg by mouth daily.    Marland Kitchen ipratropium (ATROVENT HFA) 17 MCG/ACT inhaler Inhale 2 puffs into the lungs every 6 (six) hours.    Marland Kitchen levothyroxine (SYNTHROID, LEVOTHROID) 75 MCG tablet Take 75 mcg by mouth daily before breakfast.     . metFORMIN (GLUCOPHAGE-XR) 500 MG 24 hr tablet Take 500 mg by mouth daily with breakfast.    . Methylcellulose, Laxative, (CITRUCEL PO) Take 1 capsule by mouth daily.    . polycarbophil (FIBERCON) 625 MG tablet Take 625 mg by mouth daily.    . propranolol (INDERAL) 20 MG tablet TK 1 T PO BID  0  . spironolactone (ALDACTONE) 25 MG tablet TK 0.5 T PO QAM  1  . torsemide (DEMADEX) 20 MG tablet Take 1 tablet (20 mg total)  by mouth 2 (two) times daily. Take an extra 20 mg (1 tablet) in the AM if your weight is 168 or greater. 180 tablet 1   No current facility-administered medications on file prior to visit.    Allergies  Allergen Reactions  . Codeine Nausea And Vomiting  . Quinine Derivatives Nausea And Vomiting  . Adhesive [Tape] Rash  . Penicillins Rash  . Procardia [Nifedipine] Rash    No results found for this or any previous visit (from the past 2160 hour(s)).  Objective: General: Patient is awake, alert, and oriented x 3 and in no acute distress.  Integument: Skin is warm, dry and supple bilateral. Nails are tender, long, thickened and  dystrophic with subungual debris, consistent with onychomycosis, 1-5 bilateral with incurvation and bilateral hallux margins. No signs of acute infection. No open lesions or preulcerative lesions present bilateral. Dry patch on skin on left foot. Remaining integument unremarkable.  Vasculature:  Dorsalis Pedis pulse 1/4 bilateral. Posterior Tibial pulse  0/4 bilateral.  Capillary fill time <5 sec 1-5 bilateral. No hair growth to the level of the digits. Temperature gradient within normal limits. No varicosities present bilateral. +1 pitting edema present bilateral.   Neurology: The patient has severely diminished sensation measured with a 5.07/10g Semmes Weinstein Monofilament at all pedal sites bilateral . Vibratory sensation diminished bilateral with tuning fork. No Babinski sign present bilateral.  Musculoskeletal: Asymptomatic pes planus + hammertoe pedal deformities noted bilateral. Muscular strength 4/5 in all lower extremity muscular groups bilateral without pain on range of motion . No tenderness with calf compression bilateral.  Assessment and Plan: Problem List Items Addressed This Visit    None    Visit Diagnoses    Pain due to onychomycosis of toenails of both feet    -  Primary   Diabetic polyneuropathy associated with type 2 diabetes mellitus  (HCC)       PVD (peripheral vascular disease) (HCC)       Hammer toes of both feet       Pes planus of both feet         -Examined patient. -Discussed and educated patient on diabetic foot care, especially with  regards to the vascular, neurological and musculoskeletal systems.  -Stressed the importance of good glycemic control and the detriment of not  controlling glucose levels in relation to the foot. -Mechanically debrided all nails 1-5 bilateral using sterile nail nipper and filed with dremel -Advised patient that she can use cortisone cream to the itchy spot  -[employer] diabetic shoe order form was completed; office to contact primary  care for approval / certification;  Office to arrange shoe fitting and dispensing. -Answered all patient questions -Patient to return  in 3 months for at risk foot care and when call for diabetic shoe measurements -Patient advised to call the office if any problems or questions arise in the meantime.  Landis Martins, DPM

## 2018-10-16 DIAGNOSIS — I5032 Chronic diastolic (congestive) heart failure: Secondary | ICD-10-CM | POA: Diagnosis not present

## 2018-10-16 DIAGNOSIS — J449 Chronic obstructive pulmonary disease, unspecified: Secondary | ICD-10-CM | POA: Diagnosis not present

## 2018-10-16 DIAGNOSIS — M6281 Muscle weakness (generalized): Secondary | ICD-10-CM | POA: Diagnosis not present

## 2018-10-16 DIAGNOSIS — M069 Rheumatoid arthritis, unspecified: Secondary | ICD-10-CM | POA: Diagnosis not present

## 2018-11-11 ENCOUNTER — Other Ambulatory Visit: Payer: Self-pay

## 2018-11-11 ENCOUNTER — Ambulatory Visit: Payer: Medicare Other | Admitting: Orthotics

## 2018-11-11 DIAGNOSIS — M2041 Other hammer toe(s) (acquired), right foot: Secondary | ICD-10-CM

## 2018-11-11 DIAGNOSIS — I739 Peripheral vascular disease, unspecified: Secondary | ICD-10-CM

## 2018-11-11 DIAGNOSIS — M2141 Flat foot [pes planus] (acquired), right foot: Secondary | ICD-10-CM

## 2018-11-12 NOTE — Progress Notes (Signed)

## 2018-11-16 DIAGNOSIS — M6281 Muscle weakness (generalized): Secondary | ICD-10-CM | POA: Diagnosis not present

## 2018-11-16 DIAGNOSIS — J449 Chronic obstructive pulmonary disease, unspecified: Secondary | ICD-10-CM | POA: Diagnosis not present

## 2018-11-16 DIAGNOSIS — I5032 Chronic diastolic (congestive) heart failure: Secondary | ICD-10-CM | POA: Diagnosis not present

## 2018-11-16 DIAGNOSIS — M069 Rheumatoid arthritis, unspecified: Secondary | ICD-10-CM | POA: Diagnosis not present

## 2018-12-09 ENCOUNTER — Other Ambulatory Visit: Payer: Medicare Other

## 2018-12-15 DIAGNOSIS — Z9181 History of falling: Secondary | ICD-10-CM | POA: Diagnosis not present

## 2018-12-15 DIAGNOSIS — Z79899 Other long term (current) drug therapy: Secondary | ICD-10-CM | POA: Diagnosis not present

## 2018-12-15 DIAGNOSIS — E1143 Type 2 diabetes mellitus with diabetic autonomic (poly)neuropathy: Secondary | ICD-10-CM | POA: Diagnosis not present

## 2018-12-15 DIAGNOSIS — E039 Hypothyroidism, unspecified: Secondary | ICD-10-CM | POA: Diagnosis not present

## 2018-12-16 ENCOUNTER — Ambulatory Visit: Payer: Medicare Other | Admitting: Sports Medicine

## 2018-12-16 DIAGNOSIS — M6281 Muscle weakness (generalized): Secondary | ICD-10-CM | POA: Diagnosis not present

## 2018-12-16 DIAGNOSIS — R7302 Impaired glucose tolerance (oral): Secondary | ICD-10-CM | POA: Diagnosis not present

## 2018-12-16 DIAGNOSIS — J449 Chronic obstructive pulmonary disease, unspecified: Secondary | ICD-10-CM | POA: Diagnosis not present

## 2018-12-16 DIAGNOSIS — M069 Rheumatoid arthritis, unspecified: Secondary | ICD-10-CM | POA: Diagnosis not present

## 2018-12-16 DIAGNOSIS — I5032 Chronic diastolic (congestive) heart failure: Secondary | ICD-10-CM | POA: Diagnosis not present

## 2018-12-16 DIAGNOSIS — I10 Essential (primary) hypertension: Secondary | ICD-10-CM | POA: Diagnosis not present

## 2018-12-16 DIAGNOSIS — G629 Polyneuropathy, unspecified: Secondary | ICD-10-CM | POA: Diagnosis not present

## 2018-12-17 ENCOUNTER — Ambulatory Visit (INDEPENDENT_AMBULATORY_CARE_PROVIDER_SITE_OTHER): Payer: Medicare Other | Admitting: Sports Medicine

## 2018-12-17 ENCOUNTER — Other Ambulatory Visit: Payer: Self-pay

## 2018-12-17 ENCOUNTER — Encounter: Payer: Self-pay | Admitting: Sports Medicine

## 2018-12-17 DIAGNOSIS — B351 Tinea unguium: Secondary | ICD-10-CM | POA: Diagnosis not present

## 2018-12-17 DIAGNOSIS — E1159 Type 2 diabetes mellitus with other circulatory complications: Secondary | ICD-10-CM

## 2018-12-17 DIAGNOSIS — M2042 Other hammer toe(s) (acquired), left foot: Secondary | ICD-10-CM

## 2018-12-17 DIAGNOSIS — M79675 Pain in left toe(s): Secondary | ICD-10-CM | POA: Diagnosis not present

## 2018-12-17 DIAGNOSIS — I739 Peripheral vascular disease, unspecified: Secondary | ICD-10-CM

## 2018-12-17 DIAGNOSIS — M79674 Pain in right toe(s): Secondary | ICD-10-CM

## 2018-12-17 DIAGNOSIS — M2141 Flat foot [pes planus] (acquired), right foot: Secondary | ICD-10-CM

## 2018-12-17 DIAGNOSIS — M2041 Other hammer toe(s) (acquired), right foot: Secondary | ICD-10-CM | POA: Diagnosis not present

## 2018-12-17 DIAGNOSIS — E1140 Type 2 diabetes mellitus with diabetic neuropathy, unspecified: Secondary | ICD-10-CM | POA: Diagnosis not present

## 2018-12-17 DIAGNOSIS — E1142 Type 2 diabetes mellitus with diabetic polyneuropathy: Secondary | ICD-10-CM

## 2018-12-17 DIAGNOSIS — M2142 Flat foot [pes planus] (acquired), left foot: Secondary | ICD-10-CM | POA: Diagnosis not present

## 2018-12-17 NOTE — Progress Notes (Signed)
Subjective: Casey Humphrey is a 73 y.o. female patient with history of diabetes who presents to office today complaining of long,mildly painful nails  while ambulating in shoes; unable to trim. Patient states that the corners of her big toes are sore at ingrown corners. Patient also is here to pick up Diabetic shoes.  Patient reports her glucose reading this morning was 100 mg/dl and does not know her last A1c.  Patient reports that she last went to her primary care doctor back last week. Patient denies any new changes in medication or new problems.  Patient Active Problem List   Diagnosis Date Noted  . Asthma 01/28/2018  . Cold extremities 11/04/2017  . Hypertensive heart disease with heart failure ([place]) 10/14/2017  . Mild CAD 10/14/2017  . Type 2 diabetes mellitus ([place]) 10/14/2017  . Chest pain 10/14/2017  . Chronic diastolic heart failure ([place]) 07/25/2015  . Hyperlipidemia 07/25/2015  . Pancreatic mass 04/08/2013   Current Outpatient Medications on File Prior to Visit  Medication Sig Dispense Refill  . albuterol (PROVENTIL HFA;VENTOLIN HFA) 108 (90 Base) MCG/ACT inhaler Inhale 2 puffs into the lungs every 6 (six) hours as needed for wheezing or shortness of breath. 1 Inhaler 2  . alendronate (FOSAMAX) 70 MG tablet TK 1 T PO ONCE A WEEK    . aspirin EC 81 MG tablet Take 81 mg by mouth daily.    Marland Kitchen ipratropium (ATROVENT HFA) 17 MCG/ACT inhaler Inhale 2 puffs into the lungs every 6 (six) hours.    Marland Kitchen levothyroxine (SYNTHROID, LEVOTHROID) 75 MCG tablet Take 75 mcg by mouth daily before breakfast.     . metFORMIN (GLUCOPHAGE) 500 MG tablet TK 1 T PO BID    . metFORMIN (GLUCOPHAGE-XR) 500 MG 24 hr tablet Take 500 mg by mouth daily with breakfast.    . Methylcellulose, Laxative, (CITRUCEL PO) Take 1 capsule by mouth daily.    . polycarbophil (FIBERCON) 625 MG tablet Take 625 mg by mouth daily.    . propranolol (INDERAL) 20 MG tablet TK 1 T PO BID  0  . spironolactone (ALDACTONE) 25 MG tablet TK  0.5 T PO QAM  1  . torsemide (DEMADEX) 20 MG tablet Take 1 tablet (20 mg total) by mouth 2 (two) times daily. Take an extra 20 mg (1 tablet) in the AM if your weight is 168 or greater. 180 tablet 1  . Vitamin D, Ergocalciferol, (DRISDOL) 1.25 MG (50000 UT) CAPS capsule TAKE 1 CAPSULE PO TWICE WEEKLY     No current facility-administered medications on file prior to visit.    Allergies  Allergen Reactions  . Codeine Nausea And Vomiting  . Quinine Derivatives Nausea And Vomiting  . Adhesive [Tape] Rash  . Penicillins Rash  . Procardia [Nifedipine] Rash    No results found for this or any previous visit (from the past 2160 hour(s)).  Objective: General: Patient is awake, alert, and oriented x 3 and in no acute distress.  Integument: Skin is warm, dry and supple bilateral. Nails are tender, long, thickened and  dystrophic with subungual debris, consistent with onychomycosis, 1-5 bilateral with incurvation and bilateral hallux margins. No signs of acute infection. No open lesions or preulcerative lesions present bilateral. Remaining integument unremarkable.  Vasculature:  Dorsalis Pedis pulse 1/4 bilateral. Posterior Tibial pulse  0/4 bilateral.  Capillary fill time <5 sec 1-5 bilateral. No hair growth to the level of the digits. Temperature gradient within normal limits. No varicosities present bilateral. +1 pitting edema present bilateral.  Neurology: The patient has severely diminished sensation measured with a 5.07/10g Semmes Weinstein Monofilament at all pedal sites bilateral . Vibratory sensation diminished bilateral with tuning fork. No Babinski sign present bilateral.   Musculoskeletal: Asymptomatic pes planus + hammertoe pedal deformities noted bilateral. Muscular strength 4/5 in all lower extremity muscular groups bilateral without pain on range of motion . No tenderness with calf compression bilateral.  Assessment and Plan: Problem List Items Addressed This Visit    None     Visit Diagnoses    Pain due to onychomycosis of toenails of both feet    -  Primary   Diabetic polyneuropathy associated with type 2 diabetes mellitus (HCC)       Relevant Medications   metFORMIN (GLUCOPHAGE) 500 MG tablet   PVD (peripheral vascular disease) (HCC)       Pes planus of both feet       Hammer toes of both feet         -Examined patient. -Discussed and educated patient on diabetic foot care, especially with  regards to the vascular, neurological and musculoskeletal systems.  -Mechanically debrided all nails 1-5 bilateral using sterile nail nipper and filed with dremel -Diabetic shoes dispensed this visit with proper fit. Patient able to ambulate 10 feet without pain -Patient to return  in 3 months for at risk foot care and when call for diabetic shoe measurements -Patient advised to call the office if any problems or questions arise in the meantime.  Landis Martins, DPM

## 2019-01-05 DIAGNOSIS — Z1231 Encounter for screening mammogram for malignant neoplasm of breast: Secondary | ICD-10-CM | POA: Diagnosis not present

## 2019-01-05 DIAGNOSIS — R921 Mammographic calcification found on diagnostic imaging of breast: Secondary | ICD-10-CM | POA: Diagnosis not present

## 2019-01-16 DIAGNOSIS — M069 Rheumatoid arthritis, unspecified: Secondary | ICD-10-CM | POA: Diagnosis not present

## 2019-01-16 DIAGNOSIS — I5032 Chronic diastolic (congestive) heart failure: Secondary | ICD-10-CM | POA: Diagnosis not present

## 2019-01-16 DIAGNOSIS — J449 Chronic obstructive pulmonary disease, unspecified: Secondary | ICD-10-CM | POA: Diagnosis not present

## 2019-01-16 DIAGNOSIS — M6281 Muscle weakness (generalized): Secondary | ICD-10-CM | POA: Diagnosis not present

## 2019-02-03 DIAGNOSIS — R921 Mammographic calcification found on diagnostic imaging of breast: Secondary | ICD-10-CM | POA: Diagnosis not present

## 2019-02-03 DIAGNOSIS — R928 Other abnormal and inconclusive findings on diagnostic imaging of breast: Secondary | ICD-10-CM | POA: Diagnosis not present

## 2019-02-10 DIAGNOSIS — R928 Other abnormal and inconclusive findings on diagnostic imaging of breast: Secondary | ICD-10-CM | POA: Diagnosis not present

## 2019-02-10 DIAGNOSIS — N6011 Diffuse cystic mastopathy of right breast: Secondary | ICD-10-CM | POA: Diagnosis not present

## 2019-02-10 DIAGNOSIS — N6489 Other specified disorders of breast: Secondary | ICD-10-CM | POA: Diagnosis not present

## 2019-02-10 DIAGNOSIS — R921 Mammographic calcification found on diagnostic imaging of breast: Secondary | ICD-10-CM | POA: Diagnosis not present

## 2019-02-10 DIAGNOSIS — N6311 Unspecified lump in the right breast, upper outer quadrant: Secondary | ICD-10-CM | POA: Diagnosis not present

## 2019-02-15 DIAGNOSIS — M069 Rheumatoid arthritis, unspecified: Secondary | ICD-10-CM | POA: Diagnosis not present

## 2019-02-15 DIAGNOSIS — M6281 Muscle weakness (generalized): Secondary | ICD-10-CM | POA: Diagnosis not present

## 2019-02-15 DIAGNOSIS — J449 Chronic obstructive pulmonary disease, unspecified: Secondary | ICD-10-CM | POA: Diagnosis not present

## 2019-02-15 DIAGNOSIS — I5032 Chronic diastolic (congestive) heart failure: Secondary | ICD-10-CM | POA: Diagnosis not present

## 2019-02-17 DIAGNOSIS — R7302 Impaired glucose tolerance (oral): Secondary | ICD-10-CM | POA: Diagnosis not present

## 2019-02-17 DIAGNOSIS — R0789 Other chest pain: Secondary | ICD-10-CM | POA: Diagnosis not present

## 2019-02-17 DIAGNOSIS — I10 Essential (primary) hypertension: Secondary | ICD-10-CM | POA: Diagnosis not present

## 2019-02-17 DIAGNOSIS — Z79899 Other long term (current) drug therapy: Secondary | ICD-10-CM | POA: Diagnosis not present

## 2019-03-09 DIAGNOSIS — R7989 Other specified abnormal findings of blood chemistry: Secondary | ICD-10-CM | POA: Diagnosis not present

## 2019-03-18 DIAGNOSIS — E1143 Type 2 diabetes mellitus with diabetic autonomic (poly)neuropathy: Secondary | ICD-10-CM | POA: Diagnosis not present

## 2019-03-18 DIAGNOSIS — E039 Hypothyroidism, unspecified: Secondary | ICD-10-CM | POA: Diagnosis not present

## 2019-03-18 DIAGNOSIS — I10 Essential (primary) hypertension: Secondary | ICD-10-CM | POA: Diagnosis not present

## 2019-03-24 ENCOUNTER — Encounter: Payer: Self-pay | Admitting: Sports Medicine

## 2019-03-24 ENCOUNTER — Ambulatory Visit (INDEPENDENT_AMBULATORY_CARE_PROVIDER_SITE_OTHER): Payer: Medicare Other | Admitting: Sports Medicine

## 2019-03-24 ENCOUNTER — Other Ambulatory Visit: Payer: Self-pay

## 2019-03-24 DIAGNOSIS — S90112A Contusion of left great toe without damage to nail, initial encounter: Secondary | ICD-10-CM

## 2019-03-24 DIAGNOSIS — M79674 Pain in right toe(s): Secondary | ICD-10-CM | POA: Diagnosis not present

## 2019-03-24 DIAGNOSIS — E1142 Type 2 diabetes mellitus with diabetic polyneuropathy: Secondary | ICD-10-CM

## 2019-03-24 DIAGNOSIS — M79675 Pain in left toe(s): Secondary | ICD-10-CM

## 2019-03-24 DIAGNOSIS — B351 Tinea unguium: Secondary | ICD-10-CM

## 2019-03-24 DIAGNOSIS — I739 Peripheral vascular disease, unspecified: Secondary | ICD-10-CM | POA: Diagnosis not present

## 2019-03-24 NOTE — Progress Notes (Signed)
Subjective: Casey Humphrey is a 74 y.o. female patient with history of diabetes who presents to office today complaining of long,mildly painful nails  while ambulating in shoes; unable to trim. Patient states that the corners of her big toes are sore at ingrown corners.  Reports that she tried to trim them out and wants me to trim them very well for her today.  Patient reports that her last blood sugar reading was 120 last A1c 6.7 and last visit to PCP was 1 month ago.  Patient also admits that 1 week ago she dropped a can of soda on her left great toe reports that there is no pain and has not been any swelling and refuses x-rays at today's visit.  No other pedal complaints noted.  Patient Active Problem List   Diagnosis Date Noted  . Asthma 01/28/2018  . Cold extremities 11/04/2017  . Hypertensive heart disease with heart failure ([place]) 10/14/2017  . Mild CAD 10/14/2017  . Type 2 diabetes mellitus ([place]) 10/14/2017  . Chest pain 10/14/2017  . Chronic diastolic heart failure ([place]) 07/25/2015  . Hyperlipidemia 07/25/2015  . Pancreatic mass 04/08/2013   Current Outpatient Medications on File Prior to Visit  Medication Sig Dispense Refill  . albuterol (PROVENTIL HFA;VENTOLIN HFA) 108 (90 Base) MCG/ACT inhaler Inhale 2 puffs into the lungs every 6 (six) hours as needed for wheezing or shortness of breath. 1 Inhaler 2  . alendronate (FOSAMAX) 70 MG tablet TK 1 T PO ONCE A WEEK    . aspirin EC 81 MG tablet Take 81 mg by mouth daily.    Marland Kitchen ipratropium (ATROVENT HFA) 17 MCG/ACT inhaler Inhale 2 puffs into the lungs every 6 (six) hours.    Marland Kitchen levothyroxine (SYNTHROID, LEVOTHROID) 75 MCG tablet Take 75 mcg by mouth daily before breakfast.     . metFORMIN (GLUCOPHAGE) 500 MG tablet TK 1 T PO BID    . metFORMIN (GLUCOPHAGE-XR) 500 MG 24 hr tablet Take 500 mg by mouth daily with breakfast.    . Methylcellulose, Laxative, (CITRUCEL PO) Take 1 capsule by mouth daily.    . polycarbophil (FIBERCON) 625 MG tablet  Take 625 mg by mouth daily.    . propranolol (INDERAL) 20 MG tablet TK 1 T PO BID  0  . spironolactone (ALDACTONE) 25 MG tablet TK 0.5 T PO QAM  1  . torsemide (DEMADEX) 20 MG tablet Take 1 tablet (20 mg total) by mouth 2 (two) times daily. Take an extra 20 mg (1 tablet) in the AM if your weight is 168 or greater. 180 tablet 1  . Vitamin D, Ergocalciferol, (DRISDOL) 1.25 MG (50000 UT) CAPS capsule TAKE 1 CAPSULE PO TWICE WEEKLY     No current facility-administered medications on file prior to visit.   Allergies  Allergen Reactions  . Codeine Nausea And Vomiting  . Quinine Derivatives Nausea And Vomiting  . Adhesive [Tape] Rash  . Penicillins Rash  . Procardia [Nifedipine] Rash    No results found for this or any previous visit (from the past 2160 hour(s)).  Objective: General: Patient is awake, alert, and oriented x 3 and in no acute distress.  Integument: Skin is warm, dry and supple bilateral. Nails are tender, long, thickened and  dystrophic with subungual debris, consistent with onychomycosis, 1-5 bilateral with incurvation and bilateral hallux margins. No signs of acute infection.  Contusion noted to left hallux nontender to touch.  No open lesions or preulcerative lesions present bilateral. Remaining integument unremarkable.  Vasculature:  Dorsalis Pedis pulse 1/4 bilateral. Posterior Tibial pulse  0/4 bilateral.  Capillary fill time <5 sec 1-5 bilateral. No hair growth to the level of the digits. Temperature gradient within normal limits. No varicosities present bilateral. +1 pitting edema present bilateral.   Neurology: The patient has severely diminished sensation measured with a 5.07/10g Semmes Weinstein Monofilament at all pedal sites bilateral . Vibratory sensation diminished bilateral with tuning fork. No Babinski sign present bilateral.   Musculoskeletal: Asymptomatic pes planus + hammertoe pedal deformities noted bilateral. Muscular strength 4/5 in all lower extremity  muscular groups bilateral without pain on range of motion . No tenderness with calf compression bilateral.  Assessment and Plan: Problem List Items Addressed This Visit    None    Visit Diagnoses    Pain due to onychomycosis of toenails of both feet    -  Primary   Diabetic polyneuropathy associated with type 2 diabetes mellitus (HCC)       PVD (peripheral vascular disease) ([place])       Contusion of left great toe without damage to nail, initial encounter         -Examined patient. -Discussed and educated patient on diabetic foot care, especially with  regards to the vascular, neurological and musculoskeletal systems.  -Mechanically debrided all nails 1-5 bilateral using sterile nail nipper and filed with dremel -Advised patient that we will closely monitor contusion if becomes painful or worsens return to office for follow-up evaluation and x-rays however at this visit patient is refusing to get x-rays -Patient to return  in 3 months for at risk foot care  -Patient advised to call the office if any problems or questions arise in the meantime.  Landis Martins, DPM

## 2019-06-16 DIAGNOSIS — I10 Essential (primary) hypertension: Secondary | ICD-10-CM | POA: Diagnosis not present

## 2019-06-16 DIAGNOSIS — M810 Age-related osteoporosis without current pathological fracture: Secondary | ICD-10-CM | POA: Diagnosis not present

## 2019-06-24 DIAGNOSIS — H16223 Keratoconjunctivitis sicca, not specified as Sjogren's, bilateral: Secondary | ICD-10-CM | POA: Diagnosis not present

## 2019-06-24 DIAGNOSIS — H353131 Nonexudative age-related macular degeneration, bilateral, early dry stage: Secondary | ICD-10-CM | POA: Diagnosis not present

## 2019-06-30 ENCOUNTER — Ambulatory Visit: Payer: Medicare Other | Admitting: Sports Medicine

## 2019-07-01 ENCOUNTER — Other Ambulatory Visit: Payer: Self-pay | Admitting: Sports Medicine

## 2019-07-01 ENCOUNTER — Ambulatory Visit: Payer: Medicare Other | Admitting: Sports Medicine

## 2019-07-01 ENCOUNTER — Encounter: Payer: Self-pay | Admitting: Sports Medicine

## 2019-07-01 ENCOUNTER — Other Ambulatory Visit: Payer: Self-pay

## 2019-07-01 DIAGNOSIS — E1142 Type 2 diabetes mellitus with diabetic polyneuropathy: Secondary | ICD-10-CM

## 2019-07-01 DIAGNOSIS — M79675 Pain in left toe(s): Secondary | ICD-10-CM | POA: Diagnosis not present

## 2019-07-01 DIAGNOSIS — M79672 Pain in left foot: Secondary | ICD-10-CM

## 2019-07-01 DIAGNOSIS — M79674 Pain in right toe(s): Secondary | ICD-10-CM

## 2019-07-01 DIAGNOSIS — B351 Tinea unguium: Secondary | ICD-10-CM | POA: Diagnosis not present

## 2019-07-01 DIAGNOSIS — S90112D Contusion of left great toe without damage to nail, subsequent encounter: Secondary | ICD-10-CM

## 2019-07-01 DIAGNOSIS — I739 Peripheral vascular disease, unspecified: Secondary | ICD-10-CM | POA: Diagnosis not present

## 2019-07-01 NOTE — Progress Notes (Signed)
Subjective: Casey Humphrey is a 74 y.o. female patient with history of diabetes who presents to office today complaining of long,mildly painful nails  while ambulating in shoes; unable to trim. Patient states that the corners of her big toes are sore again and clipped the skin of the left great toe trying to trim it out.  Patient reports that her last blood sugar reading was 102 last A1c does not remember and last visit to PCP was 3 months ago.  No other pedal complaints noted.  Patient Active Problem List   Diagnosis Date Noted  . Asthma 01/28/2018  . Cold extremities 11/04/2017  . Hypertensive heart disease with heart failure ([place]) 10/14/2017  . Mild CAD 10/14/2017  . Type 2 diabetes mellitus ([place]) 10/14/2017  . Chest pain 10/14/2017  . Chronic diastolic heart failure ([place]) 07/25/2015  . Hyperlipidemia 07/25/2015  . Pancreatic mass 04/08/2013   Current Outpatient Medications on File Prior to Visit  Medication Sig Dispense Refill  . albuterol (PROVENTIL HFA;VENTOLIN HFA) 108 (90 Base) MCG/ACT inhaler Inhale 2 puffs into the lungs every 6 (six) hours as needed for wheezing or shortness of breath. 1 Inhaler 2  . alendronate (FOSAMAX) 70 MG tablet TK 1 T PO ONCE A WEEK    . aspirin EC 81 MG tablet Take 81 mg by mouth daily.    Marland Kitchen ipratropium (ATROVENT HFA) 17 MCG/ACT inhaler Inhale 2 puffs into the lungs every 6 (six) hours.    Marland Kitchen levothyroxine (SYNTHROID, LEVOTHROID) 75 MCG tablet Take 75 mcg by mouth daily before breakfast.     . metFORMIN (GLUCOPHAGE) 500 MG tablet TK 1 T PO BID    . metFORMIN (GLUCOPHAGE-XR) 500 MG 24 hr tablet Take 500 mg by mouth daily with breakfast.    . Methylcellulose, Laxative, (CITRUCEL PO) Take 1 capsule by mouth daily.    . polycarbophil (FIBERCON) 625 MG tablet Take 625 mg by mouth daily.    . propranolol (INDERAL) 20 MG tablet TK 1 T PO BID  0  . spironolactone (ALDACTONE) 25 MG tablet TK 0.5 T PO QAM  1  . torsemide (DEMADEX) 20 MG tablet Take 1 tablet (20 mg  total) by mouth 2 (two) times daily. Take an extra 20 mg (1 tablet) in the AM if your weight is 168 or greater. 180 tablet 1  . Vitamin D, Ergocalciferol, (DRISDOL) 1.25 MG (50000 UT) CAPS capsule TAKE 1 CAPSULE PO TWICE WEEKLY     No current facility-administered medications on file prior to visit.   Allergies  Allergen Reactions  . Codeine Nausea And Vomiting  . Quinine Derivatives Nausea And Vomiting  . Adhesive [Tape] Rash  . Penicillins Rash  . Procardia [Nifedipine] Rash    No results found for this or any previous visit (from the past 2160 hour(s)).  Objective: General: Patient is awake, alert, and oriented x 3 and in no acute distress.  Integument: Skin is warm, dry and supple bilateral. Nails are tender, long, thickened and dystrophic with subungual debris, consistent with onychomycosis, 1-5 bilateral with incurvation and bilateral hallux margins. No signs of acute infection.  Contusion noted to left hallux nontender to touch with dried blood underneath the left hallux nail, nail is still well attached.  No signs of infection.  No open lesions or preulcerative lesions present bilateral. Remaining integument unremarkable.  Vasculature:  Dorsalis Pedis pulse 1/4 bilateral. Posterior Tibial pulse  0/4 bilateral.  Capillary fill time <5 sec 1-5 bilateral. No hair growth to the level of  the digits. Temperature gradient within normal limits. No varicosities present bilateral. +1 pitting edema present bilateral.   Neurology: The patient has severely diminished sensation measured with a 5.07/10g Semmes Weinstein Monofilament at all pedal sites bilateral . Vibratory sensation diminished bilateral with tuning fork. No Babinski sign present bilateral.   Musculoskeletal: No pain to palpation left hallux nail.  Asymptomatic pes planus + hammertoe pedal deformities noted bilateral. Muscular strength 4/5 in all lower extremity muscular groups bilateral without pain on range of motion . No  tenderness with calf compression bilateral.  Assessment and Plan: Problem List Items Addressed This Visit    None    Visit Diagnoses    Pain due to onychomycosis of toenails of both feet    -  Primary   Diabetic polyneuropathy associated with type 2 diabetes mellitus (HCC)       PVD (peripheral vascular disease) ([place])       Contusion of left great toe without damage to nail, subsequent encounter         -Examined patient. -Re-Discussed and educated patient on diabetic foot care, especially with  regards to the vascular, neurological and musculoskeletal systems.  -Advised patient to refrain from attempting to trim on her own ingrown toenails -Mechanically debrided all nails 1-5 bilateral using sterile nail nipper and filed with dremel -Patient to return  in 3 months for at risk foot care  -Patient advised to call the office if any problems or questions arise in the meantime.  Landis Martins, DPM

## 2019-07-16 DIAGNOSIS — D539 Nutritional anemia, unspecified: Secondary | ICD-10-CM | POA: Diagnosis not present

## 2019-07-16 DIAGNOSIS — Z79899 Other long term (current) drug therapy: Secondary | ICD-10-CM | POA: Diagnosis not present

## 2019-07-16 DIAGNOSIS — E1143 Type 2 diabetes mellitus with diabetic autonomic (poly)neuropathy: Secondary | ICD-10-CM | POA: Diagnosis not present

## 2019-07-16 DIAGNOSIS — I10 Essential (primary) hypertension: Secondary | ICD-10-CM | POA: Diagnosis not present

## 2019-07-16 DIAGNOSIS — E039 Hypothyroidism, unspecified: Secondary | ICD-10-CM | POA: Diagnosis not present

## 2019-07-16 DIAGNOSIS — K219 Gastro-esophageal reflux disease without esophagitis: Secondary | ICD-10-CM | POA: Diagnosis not present

## 2019-07-16 DIAGNOSIS — E559 Vitamin D deficiency, unspecified: Secondary | ICD-10-CM | POA: Diagnosis not present

## 2019-07-29 DIAGNOSIS — R7989 Other specified abnormal findings of blood chemistry: Secondary | ICD-10-CM | POA: Diagnosis not present

## 2019-08-11 ENCOUNTER — Other Ambulatory Visit: Payer: Self-pay

## 2019-08-11 NOTE — Progress Notes (Signed)
Cardiology Office Note:    Date:  08/12/2019   ID:  Casey Humphrey, DOB 09-21-1945, MRN [id number]  PCP:  Angelina Sheriff, MD  Cardiologist:  Shirlee More, MD    Referring MD: Angelina Sheriff, MD    ASSESSMENT:    1. Hypertensive heart disease with heart failure ([place])   2. Mild CAD   3. Type 2 diabetes mellitus with complication, with long-term current use of insulin (HCC)    PLAN:    In order of problems listed above:  1. Overall Ximena has done well I negotiated for her to take a loop diuretic as needed with her heart failure she is not fluid overload at this time and continue other cardiovascular medications including aspirin beta-blocker and Entresto diuretic spironolactone. 2. Stable CAD having no anginal discomfort continue current treatment she does not have statin at this time 3. Stable and managed by her PCP   Next appointment: 6 months   Medication Adjustments/Labs and Tests Ordered: Current medicines are reviewed at length with the patient today.  Concerns regarding medicines are outlined above.  No orders of the defined types were placed in this encounter.  Meds ordered this encounter  Medications  . nitroGLYCERIN (NITROSTAT) 0.4 MG SL tablet    Sig: Place 1 tablet (0.4 mg total) under the tongue every 5 (five) minutes as needed for chest pain.    Dispense:  90 tablet    Refill:  3  . furosemide (LASIX) 40 MG tablet    Sig: Take 1 tablet (40 mg total) by mouth daily as needed.    Dispense:  30 tablet    Refill:  3    Chief Complaint  Patient presents with  . Follow-up  . Congestive Heart Failure    History of Present Illness:    Casey Humphrey is a 74 y.o. female with a hx of heart failure hypertension CAD angina previous myocardial infarction type 2 diabetes stroke and hyperthyroidism  last seen 01/28/2018. Compliance with diet, lifestyle and medications: Yes  Because of urinary frequency and incontinence she stopped her diuretic.  She is not  having edema shortness of breath and we negotiated a prescription for furosemide 40 mg that she will take daily as needed.  No chest pain orthopnea palpitation or syncope.  She alerts me that her platelets are low and she will be seeing Dr. Bobby Rumpf in the cancer center at [facility].  She is not on lipid-lowering therapy and has declined statin. Past Medical History:  Diagnosis Date  . Anginal pain ([place])    takes nitro approx once per month  . Anxiety   . Arthritis   . Asthma    occ. Bronchial problems  . Bleeder's disease    "free Bleeder"  . CHF (congestive heart failure) ([place])   . Coronary artery disease   . Diabetes mellitus without complication ([place])   . Family history of adverse reaction to anesthesia    sister has n/v  . Frequency of urination   . GERD (gastroesophageal reflux disease)   . History of kidney stones   . Hypertension   . Hyperthyroidism   . Meniscus tear    left knee  . Myocardial infarction [facility]) 1980's   " it was a mild one"  . Neuromuscular disorder (HCC)    diabetic neuropathy feet  . Nocturia   . Shortness of breath dyspnea    with exertion  . Stroke [facility])    left leg affected'14-no  problems now    Past Surgical History:  Procedure Laterality Date  . ABDOMINAL HYSTERECTOMY    . CARDIAC CATHETERIZATION     8'14-[facility]  . CATARACT EXTRACTION, BILATERAL Bilateral   . CHOLECYSTECTOMY     open  . CHONDROPLASTY Left 04/27/2014   Procedure: abrasion CHONDROPLASTY of medial femoral condyle, abrasion chondroplasty of patella;  Surgeon: Tobi Bastos, MD;  Location: [facility] ORS;  Service: Orthopedics;  Laterality: Left;  . COLONOSCOPY     polyp removed  . EUS N/A 04/08/2013   Procedure: UPPER ENDOSCOPIC ULTRASOUND (EUS) LINEAR;  Surgeon: Milus Banister, MD;  Location: [facility] ENDOSCOPY;  Service: Endoscopy;  Laterality: N/A;  . KNEE ARTHROSCOPY WITH LATERAL MENISECTOMY Left 04/27/2014   Procedure: LEFT KNEE ARTHROSCOPY WITH  medial and LATERAL  MENISECTOMY.;  Surgeon: Tobi Bastos, MD;  Location: [facility] ORS;  Service: Orthopedics;  Laterality: Left;    Current Medications: Current Meds  Medication Sig  . albuterol (PROVENTIL HFA;VENTOLIN HFA) 108 (90 Base) MCG/ACT inhaler Inhale 2 puffs into the lungs every 6 (six) hours as needed for wheezing or shortness of breath.  Marland Kitchen aspirin EC 81 MG tablet Take 81 mg by mouth daily.  . fluticasone (FLONASE) 50 MCG/ACT nasal spray Place 2 sprays into both nostrils daily.  Marland Kitchen ipratropium (ATROVENT HFA) 17 MCG/ACT inhaler Inhale 2 puffs into the lungs every 6 (six) hours.  . propranolol (INDERAL) 20 MG tablet TK 1 T PO BID  . spironolactone (ALDACTONE) 25 MG tablet TK 0.5 T PO QAM  . SYNTHROID 100 MCG tablet Take 100 mcg by mouth daily.  . Vitamin D, Ergocalciferol, (DRISDOL) 1.25 MG (50000 UT) CAPS capsule TAKE 1 CAPSULE PO TWICE WEEKLY     Allergies:   Codeine, Quinine derivatives, Adhesive [tape], Penicillins, and Procardia [nifedipine]   Social History   Socioeconomic History  . Marital status: Widowed    Spouse name: Not on file  . Number of children: Not on file  . Years of education: Not on file  . Highest education level: Not on file  Occupational History  . Not on file  Tobacco Use  . Smoking status: Never Smoker  . Smokeless tobacco: Never Used  Substance and Sexual Activity  . Alcohol use: No  . Drug use: No  . Sexual activity: Not Currently  Other Topics Concern  . Not on file  Social History Narrative  . Not on file   Social Determinants of Health   Financial Resource Strain:   . Difficulty of Paying Living Expenses:   Food Insecurity:   . Worried About Charity fundraiser in the Last Year:   . Arboriculturist in the Last Year:   Transportation Needs:   . Film/video editor (Medical):   Marland Kitchen Lack of Transportation (Non-Medical):   Physical Activity:   . Days of Exercise per Week:   . Minutes of Exercise per Session:   Stress:   . Feeling of Stress :     Social Connections:   . Frequency of Communication with Friends and Family:   . Frequency of Social Gatherings with Friends and Family:   . Attends Religious Services:   . Active Member of Clubs or Organizations:   . Attends Archivist Meetings:   Marland Kitchen Marital Status:      Family History: The patient's family history includes CAD in her sister. ROS:   Please see the history of present illness.    All other systems reviewed and  are negative.  EKGs/Labs/Other Studies Reviewed:    The following studies were reviewed today  Recent Labs: 07/16/2019 Cholesterol 153 HDL 60 LDL 81 lipids are at target without a statin A1c 5.9% Creatinine 1.0 TSH elevated at 31.47 she has hypothyroid  Physical Exam:    VS:  BP 138/76   Pulse 80   Ht 5\' 2"  (1.575 m)   Wt 178 lb 12.8 oz (81.1 kg)   SpO2 95%   BMI 32.70 kg/m     Wt Readings from Last 3 Encounters:  08/12/19 178 lb 12.8 oz (81.1 kg)  07/08/18 164 lb (74.4 kg)  01/28/18 173 lb 9.6 oz (78.7 kg)     GEN:  Well nourished, well developed in no acute distress HEENT: Normal NECK: No JVD; No carotid bruits LYMPHATICS: No lymphadenopathy CARDIAC: RRR, no murmurs, rubs, gallops RESPIRATORY:  Clear to auscultation without rales, wheezing or rhonchi  ABDOMEN: Soft, non-tender, non-distended MUSCULOSKELETAL:  No edema; No deformity  SKIN: Warm and dry NEUROLOGIC:  Alert and oriented x 3 PSYCHIATRIC:  Normal affect    Signed, Shirlee More, MD  08/12/2019 2:32 PM    [facility]

## 2019-08-12 ENCOUNTER — Ambulatory Visit (INDEPENDENT_AMBULATORY_CARE_PROVIDER_SITE_OTHER): Payer: Medicare Other | Admitting: Cardiology

## 2019-08-12 ENCOUNTER — Other Ambulatory Visit: Payer: Self-pay

## 2019-08-12 ENCOUNTER — Encounter: Payer: Self-pay | Admitting: Cardiology

## 2019-08-12 VITALS — BP 138/76 | HR 80 | Ht 62.0 in | Wt 178.8 lb

## 2019-08-12 DIAGNOSIS — I110 Hypertensive heart disease with heart failure: Secondary | ICD-10-CM

## 2019-08-12 DIAGNOSIS — I2510 Atherosclerotic heart disease of native coronary artery without angina pectoris: Secondary | ICD-10-CM

## 2019-08-12 DIAGNOSIS — Z794 Long term (current) use of insulin: Secondary | ICD-10-CM | POA: Diagnosis not present

## 2019-08-12 DIAGNOSIS — E118 Type 2 diabetes mellitus with unspecified complications: Secondary | ICD-10-CM | POA: Diagnosis not present

## 2019-08-12 MED ORDER — FUROSEMIDE 40 MG PO TABS
40.0000 mg | ORAL_TABLET | Freq: Every day | ORAL | 3 refills | Status: DC | PRN
Start: 2019-08-12 — End: 2020-08-08

## 2019-08-12 MED ORDER — NITROGLYCERIN 0.4 MG SL SUBL
0.4000 mg | SUBLINGUAL_TABLET | SUBLINGUAL | 3 refills | Status: DC | PRN
Start: 2019-08-12 — End: 2020-08-08

## 2019-08-12 NOTE — Patient Instructions (Signed)
Medication Instructions:  Your physician has recommended you make the following change in your medication:  START: Furosemide 40 mg take one tablet by mouth daily as needed.  START: Nitroglycerine 0.4 mg take one tablet by mouth every 5 minutes as needed for chest pain.  *If you need a refill on your cardiac medications before your next appointment, please call your pharmacy*   Lab Work: None If you have labs (blood work) drawn today and your tests are completely normal, you will receive your results only by: Marland Kitchen MyChart Message (if you have MyChart) OR . A paper copy in the mail If you have any lab test that is abnormal or we need to change your treatment, we will call you to review the results.   Testing/Procedures: None   Follow-Up: At [facility], you and your health needs are our priority.  As part of our continuing mission to provide you with exceptional heart care, we have created designated Provider Care Teams.  These Care Teams include your primary Cardiologist (physician) and Advanced Practice Providers (APPs -  Physician Assistants and Nurse Practitioners) who all work together to provide you with the care you need, when you need it.  We recommend signing up for the patient portal called "MyChart".  Sign up information is provided on this After Visit Summary.  MyChart is used to connect with patients for Virtual Visits (Telemedicine).  Patients are able to view lab/test results, encounter notes, upcoming appointments, etc.  Non-urgent messages can be sent to your provider as well.   To learn more about what you can do with MyChart, go to [email or web address].    Your next appointment:   6 month(s)  The format for your next appointment:   In Person  Provider:   Shirlee More, MD   Other Instructions

## 2019-08-16 DIAGNOSIS — I10 Essential (primary) hypertension: Secondary | ICD-10-CM | POA: Diagnosis not present

## 2019-08-16 DIAGNOSIS — E039 Hypothyroidism, unspecified: Secondary | ICD-10-CM | POA: Diagnosis not present

## 2019-08-16 DIAGNOSIS — E785 Hyperlipidemia, unspecified: Secondary | ICD-10-CM | POA: Diagnosis not present

## 2019-08-16 DIAGNOSIS — E1143 Type 2 diabetes mellitus with diabetic autonomic (poly)neuropathy: Secondary | ICD-10-CM | POA: Diagnosis not present

## 2019-08-24 DIAGNOSIS — K7460 Unspecified cirrhosis of liver: Secondary | ICD-10-CM | POA: Diagnosis not present

## 2019-08-24 DIAGNOSIS — D696 Thrombocytopenia, unspecified: Secondary | ICD-10-CM | POA: Diagnosis not present

## 2019-09-15 DIAGNOSIS — K219 Gastro-esophageal reflux disease without esophagitis: Secondary | ICD-10-CM | POA: Diagnosis not present

## 2019-09-15 DIAGNOSIS — I10 Essential (primary) hypertension: Secondary | ICD-10-CM | POA: Diagnosis not present

## 2019-09-23 DIAGNOSIS — R262 Difficulty in walking, not elsewhere classified: Secondary | ICD-10-CM | POA: Diagnosis not present

## 2019-09-23 DIAGNOSIS — Z880 Allergy status to penicillin: Secondary | ICD-10-CM | POA: Diagnosis not present

## 2019-09-23 DIAGNOSIS — S3993XA Unspecified injury of pelvis, initial encounter: Secondary | ICD-10-CM | POA: Diagnosis not present

## 2019-09-23 DIAGNOSIS — W19XXXA Unspecified fall, initial encounter: Secondary | ICD-10-CM | POA: Diagnosis not present

## 2019-09-23 DIAGNOSIS — R410 Disorientation, unspecified: Secondary | ICD-10-CM | POA: Diagnosis not present

## 2019-09-23 DIAGNOSIS — K5900 Constipation, unspecified: Secondary | ICD-10-CM | POA: Diagnosis not present

## 2019-09-23 DIAGNOSIS — I708 Atherosclerosis of other arteries: Secondary | ICD-10-CM | POA: Diagnosis not present

## 2019-09-23 DIAGNOSIS — Z741 Need for assistance with personal care: Secondary | ICD-10-CM | POA: Diagnosis not present

## 2019-09-23 DIAGNOSIS — M6281 Muscle weakness (generalized): Secondary | ICD-10-CM | POA: Diagnosis not present

## 2019-09-23 DIAGNOSIS — R52 Pain, unspecified: Secondary | ICD-10-CM | POA: Diagnosis not present

## 2019-09-23 DIAGNOSIS — J309 Allergic rhinitis, unspecified: Secondary | ICD-10-CM | POA: Diagnosis not present

## 2019-09-23 DIAGNOSIS — D62 Acute posthemorrhagic anemia: Secondary | ICD-10-CM | POA: Diagnosis not present

## 2019-09-23 DIAGNOSIS — B359 Dermatophytosis, unspecified: Secondary | ICD-10-CM | POA: Diagnosis not present

## 2019-09-23 DIAGNOSIS — E039 Hypothyroidism, unspecified: Secondary | ICD-10-CM | POA: Diagnosis not present

## 2019-09-23 DIAGNOSIS — S72492A Other fracture of lower end of left femur, initial encounter for closed fracture: Secondary | ICD-10-CM | POA: Diagnosis not present

## 2019-09-23 DIAGNOSIS — R609 Edema, unspecified: Secondary | ICD-10-CM | POA: Diagnosis not present

## 2019-09-23 DIAGNOSIS — S7292XD Unspecified fracture of left femur, subsequent encounter for closed fracture with routine healing: Secondary | ICD-10-CM | POA: Diagnosis not present

## 2019-09-23 DIAGNOSIS — I5032 Chronic diastolic (congestive) heart failure: Secondary | ICD-10-CM | POA: Diagnosis not present

## 2019-09-23 DIAGNOSIS — Z888 Allergy status to other drugs, medicaments and biological substances status: Secondary | ICD-10-CM | POA: Diagnosis not present

## 2019-09-23 DIAGNOSIS — Z79899 Other long term (current) drug therapy: Secondary | ICD-10-CM | POA: Diagnosis not present

## 2019-09-23 DIAGNOSIS — I2510 Atherosclerotic heart disease of native coronary artery without angina pectoris: Secondary | ICD-10-CM | POA: Diagnosis not present

## 2019-09-23 DIAGNOSIS — Z9181 History of falling: Secondary | ICD-10-CM | POA: Diagnosis not present

## 2019-09-23 DIAGNOSIS — Z743 Need for continuous supervision: Secondary | ICD-10-CM | POA: Diagnosis not present

## 2019-09-23 DIAGNOSIS — R269 Unspecified abnormalities of gait and mobility: Secondary | ICD-10-CM | POA: Diagnosis not present

## 2019-09-23 DIAGNOSIS — M25552 Pain in left hip: Secondary | ICD-10-CM | POA: Diagnosis not present

## 2019-09-23 DIAGNOSIS — R079 Chest pain, unspecified: Secondary | ICD-10-CM | POA: Diagnosis not present

## 2019-09-23 DIAGNOSIS — S8992XA Unspecified injury of left lower leg, initial encounter: Secondary | ICD-10-CM | POA: Diagnosis not present

## 2019-09-23 DIAGNOSIS — R279 Unspecified lack of coordination: Secondary | ICD-10-CM | POA: Diagnosis not present

## 2019-09-23 DIAGNOSIS — Z882 Allergy status to sulfonamides status: Secondary | ICD-10-CM | POA: Diagnosis not present

## 2019-09-23 DIAGNOSIS — M2550 Pain in unspecified joint: Secondary | ICD-10-CM | POA: Diagnosis not present

## 2019-09-23 DIAGNOSIS — M1990 Unspecified osteoarthritis, unspecified site: Secondary | ICD-10-CM | POA: Diagnosis not present

## 2019-09-23 DIAGNOSIS — Z9049 Acquired absence of other specified parts of digestive tract: Secondary | ICD-10-CM | POA: Diagnosis not present

## 2019-09-23 DIAGNOSIS — M25462 Effusion, left knee: Secondary | ICD-10-CM | POA: Diagnosis not present

## 2019-09-23 DIAGNOSIS — Z885 Allergy status to narcotic agent status: Secondary | ICD-10-CM | POA: Diagnosis not present

## 2019-09-23 DIAGNOSIS — Z91048 Other nonmedicinal substance allergy status: Secondary | ICD-10-CM | POA: Diagnosis not present

## 2019-09-23 DIAGNOSIS — E119 Type 2 diabetes mellitus without complications: Secondary | ICD-10-CM | POA: Diagnosis not present

## 2019-09-23 DIAGNOSIS — S7012XA Contusion of left thigh, initial encounter: Secondary | ICD-10-CM | POA: Diagnosis not present

## 2019-09-23 DIAGNOSIS — M1712 Unilateral primary osteoarthritis, left knee: Secondary | ICD-10-CM | POA: Diagnosis not present

## 2019-09-23 DIAGNOSIS — S72452A Displaced supracondylar fracture without intracondylar extension of lower end of left femur, initial encounter for closed fracture: Secondary | ICD-10-CM | POA: Diagnosis not present

## 2019-09-23 DIAGNOSIS — J449 Chronic obstructive pulmonary disease, unspecified: Secondary | ICD-10-CM | POA: Diagnosis not present

## 2019-09-23 DIAGNOSIS — Z7401 Bed confinement status: Secondary | ICD-10-CM | POA: Diagnosis not present

## 2019-09-23 DIAGNOSIS — S8012XA Contusion of left lower leg, initial encounter: Secondary | ICD-10-CM | POA: Diagnosis not present

## 2019-09-23 DIAGNOSIS — D649 Anemia, unspecified: Secondary | ICD-10-CM | POA: Diagnosis not present

## 2019-09-23 DIAGNOSIS — G8918 Other acute postprocedural pain: Secondary | ICD-10-CM | POA: Diagnosis not present

## 2019-09-23 DIAGNOSIS — Z7982 Long term (current) use of aspirin: Secondary | ICD-10-CM | POA: Diagnosis not present

## 2019-09-23 DIAGNOSIS — D696 Thrombocytopenia, unspecified: Secondary | ICD-10-CM | POA: Diagnosis not present

## 2019-09-23 DIAGNOSIS — I10 Essential (primary) hypertension: Secondary | ICD-10-CM | POA: Diagnosis not present

## 2019-09-23 DIAGNOSIS — S72352A Displaced comminuted fracture of shaft of left femur, initial encounter for closed fracture: Secondary | ICD-10-CM | POA: Diagnosis not present

## 2019-09-23 DIAGNOSIS — I110 Hypertensive heart disease with heart failure: Secondary | ICD-10-CM | POA: Diagnosis not present

## 2019-09-23 DIAGNOSIS — S72402A Unspecified fracture of lower end of left femur, initial encounter for closed fracture: Secondary | ICD-10-CM | POA: Diagnosis not present

## 2019-09-28 DIAGNOSIS — S72452A Displaced supracondylar fracture without intracondylar extension of lower end of left femur, initial encounter for closed fracture: Secondary | ICD-10-CM | POA: Diagnosis not present

## 2019-09-28 DIAGNOSIS — R5381 Other malaise: Secondary | ICD-10-CM | POA: Diagnosis not present

## 2019-09-28 DIAGNOSIS — R410 Disorientation, unspecified: Secondary | ICD-10-CM | POA: Diagnosis not present

## 2019-09-28 DIAGNOSIS — R296 Repeated falls: Secondary | ICD-10-CM | POA: Diagnosis not present

## 2019-09-28 DIAGNOSIS — I10 Essential (primary) hypertension: Secondary | ICD-10-CM | POA: Diagnosis not present

## 2019-09-28 DIAGNOSIS — D649 Anemia, unspecified: Secondary | ICD-10-CM | POA: Diagnosis not present

## 2019-09-28 DIAGNOSIS — R269 Unspecified abnormalities of gait and mobility: Secondary | ICD-10-CM | POA: Diagnosis not present

## 2019-09-28 DIAGNOSIS — Z7401 Bed confinement status: Secondary | ICD-10-CM | POA: Diagnosis not present

## 2019-09-28 DIAGNOSIS — E559 Vitamin D deficiency, unspecified: Secondary | ICD-10-CM | POA: Diagnosis not present

## 2019-09-28 DIAGNOSIS — I5032 Chronic diastolic (congestive) heart failure: Secondary | ICD-10-CM | POA: Diagnosis not present

## 2019-09-28 DIAGNOSIS — M6281 Muscle weakness (generalized): Secondary | ICD-10-CM | POA: Diagnosis not present

## 2019-09-28 DIAGNOSIS — Z9181 History of falling: Secondary | ICD-10-CM | POA: Diagnosis not present

## 2019-09-28 DIAGNOSIS — I2510 Atherosclerotic heart disease of native coronary artery without angina pectoris: Secondary | ICD-10-CM | POA: Diagnosis not present

## 2019-09-28 DIAGNOSIS — S7292XD Unspecified fracture of left femur, subsequent encounter for closed fracture with routine healing: Secondary | ICD-10-CM | POA: Diagnosis not present

## 2019-09-28 DIAGNOSIS — J309 Allergic rhinitis, unspecified: Secondary | ICD-10-CM | POA: Diagnosis not present

## 2019-09-28 DIAGNOSIS — M1990 Unspecified osteoarthritis, unspecified site: Secondary | ICD-10-CM | POA: Diagnosis not present

## 2019-09-28 DIAGNOSIS — Z743 Need for continuous supervision: Secondary | ICD-10-CM | POA: Diagnosis not present

## 2019-09-28 DIAGNOSIS — M25552 Pain in left hip: Secondary | ICD-10-CM | POA: Diagnosis not present

## 2019-09-28 DIAGNOSIS — S79929A Unspecified injury of unspecified thigh, initial encounter: Secondary | ICD-10-CM | POA: Diagnosis not present

## 2019-09-28 DIAGNOSIS — S72302A Unspecified fracture of shaft of left femur, initial encounter for closed fracture: Secondary | ICD-10-CM | POA: Diagnosis not present

## 2019-09-28 DIAGNOSIS — B359 Dermatophytosis, unspecified: Secondary | ICD-10-CM | POA: Diagnosis not present

## 2019-09-28 DIAGNOSIS — Z741 Need for assistance with personal care: Secondary | ICD-10-CM | POA: Diagnosis not present

## 2019-09-28 DIAGNOSIS — E119 Type 2 diabetes mellitus without complications: Secondary | ICD-10-CM | POA: Diagnosis not present

## 2019-09-28 DIAGNOSIS — W19XXXA Unspecified fall, initial encounter: Secondary | ICD-10-CM | POA: Diagnosis not present

## 2019-09-28 DIAGNOSIS — D696 Thrombocytopenia, unspecified: Secondary | ICD-10-CM | POA: Diagnosis not present

## 2019-09-28 DIAGNOSIS — E039 Hypothyroidism, unspecified: Secondary | ICD-10-CM | POA: Diagnosis not present

## 2019-09-28 DIAGNOSIS — M2550 Pain in unspecified joint: Secondary | ICD-10-CM | POA: Diagnosis not present

## 2019-09-28 DIAGNOSIS — K5900 Constipation, unspecified: Secondary | ICD-10-CM | POA: Diagnosis not present

## 2019-09-28 DIAGNOSIS — R262 Difficulty in walking, not elsewhere classified: Secondary | ICD-10-CM | POA: Diagnosis not present

## 2019-09-28 DIAGNOSIS — D62 Acute posthemorrhagic anemia: Secondary | ICD-10-CM | POA: Diagnosis not present

## 2019-09-28 DIAGNOSIS — R279 Unspecified lack of coordination: Secondary | ICD-10-CM | POA: Diagnosis not present

## 2019-09-30 ENCOUNTER — Ambulatory Visit: Payer: Medicare Other | Admitting: Podiatry

## 2019-09-30 ENCOUNTER — Ambulatory Visit: Payer: Medicare Other | Admitting: Sports Medicine

## 2019-10-05 DIAGNOSIS — I5032 Chronic diastolic (congestive) heart failure: Secondary | ICD-10-CM | POA: Diagnosis not present

## 2019-10-05 DIAGNOSIS — S72302A Unspecified fracture of shaft of left femur, initial encounter for closed fracture: Secondary | ICD-10-CM | POA: Diagnosis not present

## 2019-10-05 DIAGNOSIS — R5381 Other malaise: Secondary | ICD-10-CM | POA: Diagnosis not present

## 2019-10-05 DIAGNOSIS — R296 Repeated falls: Secondary | ICD-10-CM | POA: Diagnosis not present

## 2019-10-05 DIAGNOSIS — E119 Type 2 diabetes mellitus without complications: Secondary | ICD-10-CM | POA: Diagnosis not present

## 2019-10-15 DIAGNOSIS — I10 Essential (primary) hypertension: Secondary | ICD-10-CM | POA: Diagnosis not present

## 2019-10-15 DIAGNOSIS — D649 Anemia, unspecified: Secondary | ICD-10-CM | POA: Diagnosis not present

## 2019-10-15 DIAGNOSIS — E119 Type 2 diabetes mellitus without complications: Secondary | ICD-10-CM | POA: Diagnosis not present

## 2019-10-21 DIAGNOSIS — E119 Type 2 diabetes mellitus without complications: Secondary | ICD-10-CM | POA: Diagnosis not present

## 2019-10-21 DIAGNOSIS — R5381 Other malaise: Secondary | ICD-10-CM | POA: Diagnosis not present

## 2019-10-21 DIAGNOSIS — I5032 Chronic diastolic (congestive) heart failure: Secondary | ICD-10-CM | POA: Diagnosis not present

## 2019-10-21 DIAGNOSIS — S72302A Unspecified fracture of shaft of left femur, initial encounter for closed fracture: Secondary | ICD-10-CM | POA: Diagnosis not present

## 2019-10-21 DIAGNOSIS — R296 Repeated falls: Secondary | ICD-10-CM | POA: Diagnosis not present

## 2019-10-28 DIAGNOSIS — E039 Hypothyroidism, unspecified: Secondary | ICD-10-CM | POA: Diagnosis not present

## 2019-10-28 DIAGNOSIS — E559 Vitamin D deficiency, unspecified: Secondary | ICD-10-CM | POA: Diagnosis not present

## 2019-10-28 DIAGNOSIS — Z0000 Encounter for general adult medical examination without abnormal findings: Secondary | ICD-10-CM | POA: Diagnosis not present

## 2019-10-28 DIAGNOSIS — E7439 Other disorders of intestinal carbohydrate absorption: Secondary | ICD-10-CM | POA: Diagnosis not present

## 2019-10-28 DIAGNOSIS — R413 Other amnesia: Secondary | ICD-10-CM | POA: Diagnosis not present

## 2019-10-28 DIAGNOSIS — M810 Age-related osteoporosis without current pathological fracture: Secondary | ICD-10-CM | POA: Diagnosis not present

## 2019-10-28 DIAGNOSIS — I10 Essential (primary) hypertension: Secondary | ICD-10-CM | POA: Diagnosis not present

## 2019-10-28 DIAGNOSIS — Z79899 Other long term (current) drug therapy: Secondary | ICD-10-CM | POA: Diagnosis not present

## 2019-10-28 DIAGNOSIS — S7292XD Unspecified fracture of left femur, subsequent encounter for closed fracture with routine healing: Secondary | ICD-10-CM | POA: Diagnosis not present

## 2019-10-28 DIAGNOSIS — R7309 Other abnormal glucose: Secondary | ICD-10-CM | POA: Diagnosis not present

## 2019-11-01 DIAGNOSIS — E1151 Type 2 diabetes mellitus with diabetic peripheral angiopathy without gangrene: Secondary | ICD-10-CM | POA: Diagnosis not present

## 2019-11-01 DIAGNOSIS — I5032 Chronic diastolic (congestive) heart failure: Secondary | ICD-10-CM | POA: Diagnosis not present

## 2019-11-01 DIAGNOSIS — G2581 Restless legs syndrome: Secondary | ICD-10-CM | POA: Diagnosis not present

## 2019-11-01 DIAGNOSIS — M80052D Age-related osteoporosis with current pathological fracture, left femur, subsequent encounter for fracture with routine healing: Secondary | ICD-10-CM | POA: Diagnosis not present

## 2019-11-01 DIAGNOSIS — R413 Other amnesia: Secondary | ICD-10-CM | POA: Diagnosis not present

## 2019-11-01 DIAGNOSIS — Z7951 Long term (current) use of inhaled steroids: Secondary | ICD-10-CM | POA: Diagnosis not present

## 2019-11-01 DIAGNOSIS — Z7982 Long term (current) use of aspirin: Secondary | ICD-10-CM | POA: Diagnosis not present

## 2019-11-01 DIAGNOSIS — I872 Venous insufficiency (chronic) (peripheral): Secondary | ICD-10-CM | POA: Diagnosis not present

## 2019-11-01 DIAGNOSIS — Z9181 History of falling: Secondary | ICD-10-CM | POA: Diagnosis not present

## 2019-11-01 DIAGNOSIS — J449 Chronic obstructive pulmonary disease, unspecified: Secondary | ICD-10-CM | POA: Diagnosis not present

## 2019-11-01 DIAGNOSIS — K8689 Other specified diseases of pancreas: Secondary | ICD-10-CM | POA: Diagnosis not present

## 2019-11-01 DIAGNOSIS — E1141 Type 2 diabetes mellitus with diabetic mononeuropathy: Secondary | ICD-10-CM | POA: Diagnosis not present

## 2019-11-01 DIAGNOSIS — Z87310 Personal history of (healed) osteoporosis fracture: Secondary | ICD-10-CM | POA: Diagnosis not present

## 2019-11-01 DIAGNOSIS — Z4789 Encounter for other orthopedic aftercare: Secondary | ICD-10-CM | POA: Diagnosis not present

## 2019-11-01 DIAGNOSIS — M1991 Primary osteoarthritis, unspecified site: Secondary | ICD-10-CM | POA: Diagnosis not present

## 2019-11-01 DIAGNOSIS — I110 Hypertensive heart disease with heart failure: Secondary | ICD-10-CM | POA: Diagnosis not present

## 2019-11-01 DIAGNOSIS — E039 Hypothyroidism, unspecified: Secondary | ICD-10-CM | POA: Diagnosis not present

## 2019-11-01 DIAGNOSIS — K7460 Unspecified cirrhosis of liver: Secondary | ICD-10-CM | POA: Diagnosis not present

## 2019-11-01 DIAGNOSIS — M4800 Spinal stenosis, site unspecified: Secondary | ICD-10-CM | POA: Diagnosis not present

## 2019-11-03 DIAGNOSIS — E1151 Type 2 diabetes mellitus with diabetic peripheral angiopathy without gangrene: Secondary | ICD-10-CM | POA: Diagnosis not present

## 2019-11-03 DIAGNOSIS — J449 Chronic obstructive pulmonary disease, unspecified: Secondary | ICD-10-CM | POA: Diagnosis not present

## 2019-11-03 DIAGNOSIS — I872 Venous insufficiency (chronic) (peripheral): Secondary | ICD-10-CM | POA: Diagnosis not present

## 2019-11-03 DIAGNOSIS — M4800 Spinal stenosis, site unspecified: Secondary | ICD-10-CM | POA: Diagnosis not present

## 2019-11-03 DIAGNOSIS — M80052D Age-related osteoporosis with current pathological fracture, left femur, subsequent encounter for fracture with routine healing: Secondary | ICD-10-CM | POA: Diagnosis not present

## 2019-11-03 DIAGNOSIS — I5032 Chronic diastolic (congestive) heart failure: Secondary | ICD-10-CM | POA: Diagnosis not present

## 2019-11-03 DIAGNOSIS — I110 Hypertensive heart disease with heart failure: Secondary | ICD-10-CM | POA: Diagnosis not present

## 2019-11-03 DIAGNOSIS — E1141 Type 2 diabetes mellitus with diabetic mononeuropathy: Secondary | ICD-10-CM | POA: Diagnosis not present

## 2019-11-03 DIAGNOSIS — M1991 Primary osteoarthritis, unspecified site: Secondary | ICD-10-CM | POA: Diagnosis not present

## 2019-11-03 DIAGNOSIS — E039 Hypothyroidism, unspecified: Secondary | ICD-10-CM | POA: Diagnosis not present

## 2019-11-03 DIAGNOSIS — Z7951 Long term (current) use of inhaled steroids: Secondary | ICD-10-CM | POA: Diagnosis not present

## 2019-11-03 DIAGNOSIS — G2581 Restless legs syndrome: Secondary | ICD-10-CM | POA: Diagnosis not present

## 2019-11-03 DIAGNOSIS — Z9181 History of falling: Secondary | ICD-10-CM | POA: Diagnosis not present

## 2019-11-03 DIAGNOSIS — Z7982 Long term (current) use of aspirin: Secondary | ICD-10-CM | POA: Diagnosis not present

## 2019-11-03 DIAGNOSIS — K8689 Other specified diseases of pancreas: Secondary | ICD-10-CM | POA: Diagnosis not present

## 2019-11-03 DIAGNOSIS — K7460 Unspecified cirrhosis of liver: Secondary | ICD-10-CM | POA: Diagnosis not present

## 2019-11-03 DIAGNOSIS — Z87310 Personal history of (healed) osteoporosis fracture: Secondary | ICD-10-CM | POA: Diagnosis not present

## 2019-11-03 DIAGNOSIS — Z4789 Encounter for other orthopedic aftercare: Secondary | ICD-10-CM | POA: Diagnosis not present

## 2019-11-03 DIAGNOSIS — R413 Other amnesia: Secondary | ICD-10-CM | POA: Diagnosis not present

## 2019-11-04 DIAGNOSIS — M1991 Primary osteoarthritis, unspecified site: Secondary | ICD-10-CM | POA: Diagnosis not present

## 2019-11-04 DIAGNOSIS — I110 Hypertensive heart disease with heart failure: Secondary | ICD-10-CM | POA: Diagnosis not present

## 2019-11-04 DIAGNOSIS — Z7951 Long term (current) use of inhaled steroids: Secondary | ICD-10-CM | POA: Diagnosis not present

## 2019-11-04 DIAGNOSIS — E039 Hypothyroidism, unspecified: Secondary | ICD-10-CM | POA: Diagnosis not present

## 2019-11-04 DIAGNOSIS — Z7982 Long term (current) use of aspirin: Secondary | ICD-10-CM | POA: Diagnosis not present

## 2019-11-04 DIAGNOSIS — K8689 Other specified diseases of pancreas: Secondary | ICD-10-CM | POA: Diagnosis not present

## 2019-11-04 DIAGNOSIS — D510 Vitamin B12 deficiency anemia due to intrinsic factor deficiency: Secondary | ICD-10-CM | POA: Diagnosis not present

## 2019-11-04 DIAGNOSIS — M4800 Spinal stenosis, site unspecified: Secondary | ICD-10-CM | POA: Diagnosis not present

## 2019-11-04 DIAGNOSIS — Z9181 History of falling: Secondary | ICD-10-CM | POA: Diagnosis not present

## 2019-11-04 DIAGNOSIS — Z87310 Personal history of (healed) osteoporosis fracture: Secondary | ICD-10-CM | POA: Diagnosis not present

## 2019-11-04 DIAGNOSIS — M80052D Age-related osteoporosis with current pathological fracture, left femur, subsequent encounter for fracture with routine healing: Secondary | ICD-10-CM | POA: Diagnosis not present

## 2019-11-04 DIAGNOSIS — J449 Chronic obstructive pulmonary disease, unspecified: Secondary | ICD-10-CM | POA: Diagnosis not present

## 2019-11-04 DIAGNOSIS — E1151 Type 2 diabetes mellitus with diabetic peripheral angiopathy without gangrene: Secondary | ICD-10-CM | POA: Diagnosis not present

## 2019-11-04 DIAGNOSIS — K7460 Unspecified cirrhosis of liver: Secondary | ICD-10-CM | POA: Diagnosis not present

## 2019-11-04 DIAGNOSIS — G2581 Restless legs syndrome: Secondary | ICD-10-CM | POA: Diagnosis not present

## 2019-11-04 DIAGNOSIS — Z4789 Encounter for other orthopedic aftercare: Secondary | ICD-10-CM | POA: Diagnosis not present

## 2019-11-04 DIAGNOSIS — E1141 Type 2 diabetes mellitus with diabetic mononeuropathy: Secondary | ICD-10-CM | POA: Diagnosis not present

## 2019-11-04 DIAGNOSIS — I5032 Chronic diastolic (congestive) heart failure: Secondary | ICD-10-CM | POA: Diagnosis not present

## 2019-11-04 DIAGNOSIS — I872 Venous insufficiency (chronic) (peripheral): Secondary | ICD-10-CM | POA: Diagnosis not present

## 2019-11-04 DIAGNOSIS — R413 Other amnesia: Secondary | ICD-10-CM | POA: Diagnosis not present

## 2019-11-04 DIAGNOSIS — D649 Anemia, unspecified: Secondary | ICD-10-CM | POA: Diagnosis not present

## 2019-11-08 DIAGNOSIS — I110 Hypertensive heart disease with heart failure: Secondary | ICD-10-CM | POA: Diagnosis not present

## 2019-11-08 DIAGNOSIS — G2581 Restless legs syndrome: Secondary | ICD-10-CM | POA: Diagnosis not present

## 2019-11-08 DIAGNOSIS — M80052D Age-related osteoporosis with current pathological fracture, left femur, subsequent encounter for fracture with routine healing: Secondary | ICD-10-CM | POA: Diagnosis not present

## 2019-11-08 DIAGNOSIS — I5032 Chronic diastolic (congestive) heart failure: Secondary | ICD-10-CM | POA: Diagnosis not present

## 2019-11-08 DIAGNOSIS — K7460 Unspecified cirrhosis of liver: Secondary | ICD-10-CM | POA: Diagnosis not present

## 2019-11-08 DIAGNOSIS — I872 Venous insufficiency (chronic) (peripheral): Secondary | ICD-10-CM | POA: Diagnosis not present

## 2019-11-08 DIAGNOSIS — Z7982 Long term (current) use of aspirin: Secondary | ICD-10-CM | POA: Diagnosis not present

## 2019-11-08 DIAGNOSIS — R413 Other amnesia: Secondary | ICD-10-CM | POA: Diagnosis not present

## 2019-11-08 DIAGNOSIS — M4800 Spinal stenosis, site unspecified: Secondary | ICD-10-CM | POA: Diagnosis not present

## 2019-11-08 DIAGNOSIS — Z7951 Long term (current) use of inhaled steroids: Secondary | ICD-10-CM | POA: Diagnosis not present

## 2019-11-08 DIAGNOSIS — E1151 Type 2 diabetes mellitus with diabetic peripheral angiopathy without gangrene: Secondary | ICD-10-CM | POA: Diagnosis not present

## 2019-11-08 DIAGNOSIS — E039 Hypothyroidism, unspecified: Secondary | ICD-10-CM | POA: Diagnosis not present

## 2019-11-08 DIAGNOSIS — M1991 Primary osteoarthritis, unspecified site: Secondary | ICD-10-CM | POA: Diagnosis not present

## 2019-11-08 DIAGNOSIS — K8689 Other specified diseases of pancreas: Secondary | ICD-10-CM | POA: Diagnosis not present

## 2019-11-08 DIAGNOSIS — Z4789 Encounter for other orthopedic aftercare: Secondary | ICD-10-CM | POA: Diagnosis not present

## 2019-11-08 DIAGNOSIS — J449 Chronic obstructive pulmonary disease, unspecified: Secondary | ICD-10-CM | POA: Diagnosis not present

## 2019-11-08 DIAGNOSIS — E1141 Type 2 diabetes mellitus with diabetic mononeuropathy: Secondary | ICD-10-CM | POA: Diagnosis not present

## 2019-11-08 DIAGNOSIS — Z87310 Personal history of (healed) osteoporosis fracture: Secondary | ICD-10-CM | POA: Diagnosis not present

## 2019-11-08 DIAGNOSIS — Z9181 History of falling: Secondary | ICD-10-CM | POA: Diagnosis not present

## 2019-11-10 DIAGNOSIS — Z7982 Long term (current) use of aspirin: Secondary | ICD-10-CM | POA: Diagnosis not present

## 2019-11-10 DIAGNOSIS — Z7951 Long term (current) use of inhaled steroids: Secondary | ICD-10-CM | POA: Diagnosis not present

## 2019-11-10 DIAGNOSIS — E039 Hypothyroidism, unspecified: Secondary | ICD-10-CM | POA: Diagnosis not present

## 2019-11-10 DIAGNOSIS — Z4789 Encounter for other orthopedic aftercare: Secondary | ICD-10-CM | POA: Diagnosis not present

## 2019-11-10 DIAGNOSIS — G2581 Restless legs syndrome: Secondary | ICD-10-CM | POA: Diagnosis not present

## 2019-11-10 DIAGNOSIS — M80052D Age-related osteoporosis with current pathological fracture, left femur, subsequent encounter for fracture with routine healing: Secondary | ICD-10-CM | POA: Diagnosis not present

## 2019-11-10 DIAGNOSIS — Z87310 Personal history of (healed) osteoporosis fracture: Secondary | ICD-10-CM | POA: Diagnosis not present

## 2019-11-10 DIAGNOSIS — R413 Other amnesia: Secondary | ICD-10-CM | POA: Diagnosis not present

## 2019-11-10 DIAGNOSIS — Z9181 History of falling: Secondary | ICD-10-CM | POA: Diagnosis not present

## 2019-11-10 DIAGNOSIS — K7460 Unspecified cirrhosis of liver: Secondary | ICD-10-CM | POA: Diagnosis not present

## 2019-11-10 DIAGNOSIS — M1991 Primary osteoarthritis, unspecified site: Secondary | ICD-10-CM | POA: Diagnosis not present

## 2019-11-10 DIAGNOSIS — E1141 Type 2 diabetes mellitus with diabetic mononeuropathy: Secondary | ICD-10-CM | POA: Diagnosis not present

## 2019-11-10 DIAGNOSIS — J449 Chronic obstructive pulmonary disease, unspecified: Secondary | ICD-10-CM | POA: Diagnosis not present

## 2019-11-10 DIAGNOSIS — I5032 Chronic diastolic (congestive) heart failure: Secondary | ICD-10-CM | POA: Diagnosis not present

## 2019-11-10 DIAGNOSIS — I872 Venous insufficiency (chronic) (peripheral): Secondary | ICD-10-CM | POA: Diagnosis not present

## 2019-11-10 DIAGNOSIS — I110 Hypertensive heart disease with heart failure: Secondary | ICD-10-CM | POA: Diagnosis not present

## 2019-11-10 DIAGNOSIS — E1151 Type 2 diabetes mellitus with diabetic peripheral angiopathy without gangrene: Secondary | ICD-10-CM | POA: Diagnosis not present

## 2019-11-10 DIAGNOSIS — K8689 Other specified diseases of pancreas: Secondary | ICD-10-CM | POA: Diagnosis not present

## 2019-11-10 DIAGNOSIS — M4800 Spinal stenosis, site unspecified: Secondary | ICD-10-CM | POA: Diagnosis not present

## 2019-11-11 DIAGNOSIS — K8689 Other specified diseases of pancreas: Secondary | ICD-10-CM | POA: Diagnosis not present

## 2019-11-11 DIAGNOSIS — E039 Hypothyroidism, unspecified: Secondary | ICD-10-CM | POA: Diagnosis not present

## 2019-11-11 DIAGNOSIS — Z87310 Personal history of (healed) osteoporosis fracture: Secondary | ICD-10-CM | POA: Diagnosis not present

## 2019-11-11 DIAGNOSIS — E1151 Type 2 diabetes mellitus with diabetic peripheral angiopathy without gangrene: Secondary | ICD-10-CM | POA: Diagnosis not present

## 2019-11-11 DIAGNOSIS — I110 Hypertensive heart disease with heart failure: Secondary | ICD-10-CM | POA: Diagnosis not present

## 2019-11-11 DIAGNOSIS — J449 Chronic obstructive pulmonary disease, unspecified: Secondary | ICD-10-CM | POA: Diagnosis not present

## 2019-11-11 DIAGNOSIS — M4800 Spinal stenosis, site unspecified: Secondary | ICD-10-CM | POA: Diagnosis not present

## 2019-11-11 DIAGNOSIS — M1991 Primary osteoarthritis, unspecified site: Secondary | ICD-10-CM | POA: Diagnosis not present

## 2019-11-11 DIAGNOSIS — Z4789 Encounter for other orthopedic aftercare: Secondary | ICD-10-CM | POA: Diagnosis not present

## 2019-11-11 DIAGNOSIS — Z7982 Long term (current) use of aspirin: Secondary | ICD-10-CM | POA: Diagnosis not present

## 2019-11-11 DIAGNOSIS — Z7951 Long term (current) use of inhaled steroids: Secondary | ICD-10-CM | POA: Diagnosis not present

## 2019-11-11 DIAGNOSIS — I872 Venous insufficiency (chronic) (peripheral): Secondary | ICD-10-CM | POA: Diagnosis not present

## 2019-11-11 DIAGNOSIS — Z9181 History of falling: Secondary | ICD-10-CM | POA: Diagnosis not present

## 2019-11-11 DIAGNOSIS — E1141 Type 2 diabetes mellitus with diabetic mononeuropathy: Secondary | ICD-10-CM | POA: Diagnosis not present

## 2019-11-11 DIAGNOSIS — M80052D Age-related osteoporosis with current pathological fracture, left femur, subsequent encounter for fracture with routine healing: Secondary | ICD-10-CM | POA: Diagnosis not present

## 2019-11-11 DIAGNOSIS — R413 Other amnesia: Secondary | ICD-10-CM | POA: Diagnosis not present

## 2019-11-11 DIAGNOSIS — G2581 Restless legs syndrome: Secondary | ICD-10-CM | POA: Diagnosis not present

## 2019-11-11 DIAGNOSIS — I5032 Chronic diastolic (congestive) heart failure: Secondary | ICD-10-CM | POA: Diagnosis not present

## 2019-11-11 DIAGNOSIS — K7460 Unspecified cirrhosis of liver: Secondary | ICD-10-CM | POA: Diagnosis not present

## 2019-11-15 DIAGNOSIS — I872 Venous insufficiency (chronic) (peripheral): Secondary | ICD-10-CM | POA: Diagnosis not present

## 2019-11-15 DIAGNOSIS — K8689 Other specified diseases of pancreas: Secondary | ICD-10-CM | POA: Diagnosis not present

## 2019-11-15 DIAGNOSIS — I5032 Chronic diastolic (congestive) heart failure: Secondary | ICD-10-CM | POA: Diagnosis not present

## 2019-11-15 DIAGNOSIS — Z7951 Long term (current) use of inhaled steroids: Secondary | ICD-10-CM | POA: Diagnosis not present

## 2019-11-15 DIAGNOSIS — M4800 Spinal stenosis, site unspecified: Secondary | ICD-10-CM | POA: Diagnosis not present

## 2019-11-15 DIAGNOSIS — K7460 Unspecified cirrhosis of liver: Secondary | ICD-10-CM | POA: Diagnosis not present

## 2019-11-15 DIAGNOSIS — Z7982 Long term (current) use of aspirin: Secondary | ICD-10-CM | POA: Diagnosis not present

## 2019-11-15 DIAGNOSIS — Z4789 Encounter for other orthopedic aftercare: Secondary | ICD-10-CM | POA: Diagnosis not present

## 2019-11-15 DIAGNOSIS — I110 Hypertensive heart disease with heart failure: Secondary | ICD-10-CM | POA: Diagnosis not present

## 2019-11-15 DIAGNOSIS — E1141 Type 2 diabetes mellitus with diabetic mononeuropathy: Secondary | ICD-10-CM | POA: Diagnosis not present

## 2019-11-15 DIAGNOSIS — E039 Hypothyroidism, unspecified: Secondary | ICD-10-CM | POA: Diagnosis not present

## 2019-11-15 DIAGNOSIS — Z87310 Personal history of (healed) osteoporosis fracture: Secondary | ICD-10-CM | POA: Diagnosis not present

## 2019-11-15 DIAGNOSIS — M80052D Age-related osteoporosis with current pathological fracture, left femur, subsequent encounter for fracture with routine healing: Secondary | ICD-10-CM | POA: Diagnosis not present

## 2019-11-15 DIAGNOSIS — M1991 Primary osteoarthritis, unspecified site: Secondary | ICD-10-CM | POA: Diagnosis not present

## 2019-11-15 DIAGNOSIS — Z9181 History of falling: Secondary | ICD-10-CM | POA: Diagnosis not present

## 2019-11-15 DIAGNOSIS — J449 Chronic obstructive pulmonary disease, unspecified: Secondary | ICD-10-CM | POA: Diagnosis not present

## 2019-11-15 DIAGNOSIS — G2581 Restless legs syndrome: Secondary | ICD-10-CM | POA: Diagnosis not present

## 2019-11-15 DIAGNOSIS — E1151 Type 2 diabetes mellitus with diabetic peripheral angiopathy without gangrene: Secondary | ICD-10-CM | POA: Diagnosis not present

## 2019-11-15 DIAGNOSIS — R413 Other amnesia: Secondary | ICD-10-CM | POA: Diagnosis not present

## 2019-11-16 DIAGNOSIS — I10 Essential (primary) hypertension: Secondary | ICD-10-CM | POA: Diagnosis not present

## 2019-11-16 DIAGNOSIS — M810 Age-related osteoporosis without current pathological fracture: Secondary | ICD-10-CM | POA: Diagnosis not present

## 2019-11-16 DIAGNOSIS — K7460 Unspecified cirrhosis of liver: Secondary | ICD-10-CM | POA: Diagnosis not present

## 2019-12-21 DIAGNOSIS — E039 Hypothyroidism, unspecified: Secondary | ICD-10-CM | POA: Diagnosis not present

## 2019-12-21 DIAGNOSIS — K8689 Other specified diseases of pancreas: Secondary | ICD-10-CM | POA: Diagnosis not present

## 2019-12-21 DIAGNOSIS — M1991 Primary osteoarthritis, unspecified site: Secondary | ICD-10-CM | POA: Diagnosis not present

## 2019-12-21 DIAGNOSIS — I110 Hypertensive heart disease with heart failure: Secondary | ICD-10-CM | POA: Diagnosis not present

## 2019-12-21 DIAGNOSIS — I5032 Chronic diastolic (congestive) heart failure: Secondary | ICD-10-CM | POA: Diagnosis not present

## 2019-12-21 DIAGNOSIS — J449 Chronic obstructive pulmonary disease, unspecified: Secondary | ICD-10-CM | POA: Diagnosis not present

## 2019-12-21 DIAGNOSIS — I872 Venous insufficiency (chronic) (peripheral): Secondary | ICD-10-CM | POA: Diagnosis not present

## 2019-12-21 DIAGNOSIS — M80052D Age-related osteoporosis with current pathological fracture, left femur, subsequent encounter for fracture with routine healing: Secondary | ICD-10-CM | POA: Diagnosis not present

## 2019-12-21 DIAGNOSIS — R413 Other amnesia: Secondary | ICD-10-CM | POA: Diagnosis not present

## 2019-12-21 DIAGNOSIS — E1151 Type 2 diabetes mellitus with diabetic peripheral angiopathy without gangrene: Secondary | ICD-10-CM | POA: Diagnosis not present

## 2019-12-21 DIAGNOSIS — Z4789 Encounter for other orthopedic aftercare: Secondary | ICD-10-CM | POA: Diagnosis not present

## 2019-12-21 DIAGNOSIS — E1141 Type 2 diabetes mellitus with diabetic mononeuropathy: Secondary | ICD-10-CM | POA: Diagnosis not present

## 2019-12-21 DIAGNOSIS — Z7951 Long term (current) use of inhaled steroids: Secondary | ICD-10-CM | POA: Diagnosis not present

## 2019-12-21 DIAGNOSIS — G2581 Restless legs syndrome: Secondary | ICD-10-CM | POA: Diagnosis not present

## 2019-12-21 DIAGNOSIS — Z7982 Long term (current) use of aspirin: Secondary | ICD-10-CM | POA: Diagnosis not present

## 2019-12-21 DIAGNOSIS — M4800 Spinal stenosis, site unspecified: Secondary | ICD-10-CM | POA: Diagnosis not present

## 2019-12-21 DIAGNOSIS — Z9181 History of falling: Secondary | ICD-10-CM | POA: Diagnosis not present

## 2019-12-21 DIAGNOSIS — K7460 Unspecified cirrhosis of liver: Secondary | ICD-10-CM | POA: Diagnosis not present

## 2019-12-21 DIAGNOSIS — Z87310 Personal history of (healed) osteoporosis fracture: Secondary | ICD-10-CM | POA: Diagnosis not present

## 2019-12-28 DIAGNOSIS — S7292XD Unspecified fracture of left femur, subsequent encounter for closed fracture with routine healing: Secondary | ICD-10-CM | POA: Diagnosis not present

## 2019-12-28 DIAGNOSIS — S72453A Displaced supracondylar fracture without intracondylar extension of lower end of unspecified femur, initial encounter for closed fracture: Secondary | ICD-10-CM | POA: Diagnosis not present

## 2019-12-29 DIAGNOSIS — K8689 Other specified diseases of pancreas: Secondary | ICD-10-CM | POA: Diagnosis not present

## 2019-12-29 DIAGNOSIS — M1991 Primary osteoarthritis, unspecified site: Secondary | ICD-10-CM | POA: Diagnosis not present

## 2019-12-29 DIAGNOSIS — Z7982 Long term (current) use of aspirin: Secondary | ICD-10-CM | POA: Diagnosis not present

## 2019-12-29 DIAGNOSIS — K7460 Unspecified cirrhosis of liver: Secondary | ICD-10-CM | POA: Diagnosis not present

## 2019-12-29 DIAGNOSIS — M4800 Spinal stenosis, site unspecified: Secondary | ICD-10-CM | POA: Diagnosis not present

## 2019-12-29 DIAGNOSIS — M80052D Age-related osteoporosis with current pathological fracture, left femur, subsequent encounter for fracture with routine healing: Secondary | ICD-10-CM | POA: Diagnosis not present

## 2019-12-29 DIAGNOSIS — Z4789 Encounter for other orthopedic aftercare: Secondary | ICD-10-CM | POA: Diagnosis not present

## 2019-12-29 DIAGNOSIS — G2581 Restless legs syndrome: Secondary | ICD-10-CM | POA: Diagnosis not present

## 2019-12-29 DIAGNOSIS — E039 Hypothyroidism, unspecified: Secondary | ICD-10-CM | POA: Diagnosis not present

## 2019-12-29 DIAGNOSIS — I110 Hypertensive heart disease with heart failure: Secondary | ICD-10-CM | POA: Diagnosis not present

## 2019-12-29 DIAGNOSIS — Z9181 History of falling: Secondary | ICD-10-CM | POA: Diagnosis not present

## 2019-12-29 DIAGNOSIS — I872 Venous insufficiency (chronic) (peripheral): Secondary | ICD-10-CM | POA: Diagnosis not present

## 2019-12-29 DIAGNOSIS — Z7951 Long term (current) use of inhaled steroids: Secondary | ICD-10-CM | POA: Diagnosis not present

## 2019-12-29 DIAGNOSIS — E1141 Type 2 diabetes mellitus with diabetic mononeuropathy: Secondary | ICD-10-CM | POA: Diagnosis not present

## 2019-12-29 DIAGNOSIS — E1151 Type 2 diabetes mellitus with diabetic peripheral angiopathy without gangrene: Secondary | ICD-10-CM | POA: Diagnosis not present

## 2019-12-29 DIAGNOSIS — R413 Other amnesia: Secondary | ICD-10-CM | POA: Diagnosis not present

## 2019-12-29 DIAGNOSIS — I5032 Chronic diastolic (congestive) heart failure: Secondary | ICD-10-CM | POA: Diagnosis not present

## 2019-12-29 DIAGNOSIS — Z87310 Personal history of (healed) osteoporosis fracture: Secondary | ICD-10-CM | POA: Diagnosis not present

## 2019-12-29 DIAGNOSIS — J449 Chronic obstructive pulmonary disease, unspecified: Secondary | ICD-10-CM | POA: Diagnosis not present

## 2020-01-04 DIAGNOSIS — I5032 Chronic diastolic (congestive) heart failure: Secondary | ICD-10-CM | POA: Diagnosis not present

## 2020-01-04 DIAGNOSIS — E1151 Type 2 diabetes mellitus with diabetic peripheral angiopathy without gangrene: Secondary | ICD-10-CM | POA: Diagnosis not present

## 2020-01-04 DIAGNOSIS — K7460 Unspecified cirrhosis of liver: Secondary | ICD-10-CM | POA: Diagnosis not present

## 2020-01-04 DIAGNOSIS — I872 Venous insufficiency (chronic) (peripheral): Secondary | ICD-10-CM | POA: Diagnosis not present

## 2020-01-04 DIAGNOSIS — K8689 Other specified diseases of pancreas: Secondary | ICD-10-CM | POA: Diagnosis not present

## 2020-01-04 DIAGNOSIS — Z9181 History of falling: Secondary | ICD-10-CM | POA: Diagnosis not present

## 2020-01-04 DIAGNOSIS — M1991 Primary osteoarthritis, unspecified site: Secondary | ICD-10-CM | POA: Diagnosis not present

## 2020-01-04 DIAGNOSIS — E1141 Type 2 diabetes mellitus with diabetic mononeuropathy: Secondary | ICD-10-CM | POA: Diagnosis not present

## 2020-01-04 DIAGNOSIS — E039 Hypothyroidism, unspecified: Secondary | ICD-10-CM | POA: Diagnosis not present

## 2020-01-04 DIAGNOSIS — Z87310 Personal history of (healed) osteoporosis fracture: Secondary | ICD-10-CM | POA: Diagnosis not present

## 2020-01-04 DIAGNOSIS — I110 Hypertensive heart disease with heart failure: Secondary | ICD-10-CM | POA: Diagnosis not present

## 2020-01-04 DIAGNOSIS — R413 Other amnesia: Secondary | ICD-10-CM | POA: Diagnosis not present

## 2020-01-04 DIAGNOSIS — M80052D Age-related osteoporosis with current pathological fracture, left femur, subsequent encounter for fracture with routine healing: Secondary | ICD-10-CM | POA: Diagnosis not present

## 2020-01-04 DIAGNOSIS — Z4789 Encounter for other orthopedic aftercare: Secondary | ICD-10-CM | POA: Diagnosis not present

## 2020-01-04 DIAGNOSIS — J449 Chronic obstructive pulmonary disease, unspecified: Secondary | ICD-10-CM | POA: Diagnosis not present

## 2020-01-04 DIAGNOSIS — M4800 Spinal stenosis, site unspecified: Secondary | ICD-10-CM | POA: Diagnosis not present

## 2020-01-04 DIAGNOSIS — Z7982 Long term (current) use of aspirin: Secondary | ICD-10-CM | POA: Diagnosis not present

## 2020-01-04 DIAGNOSIS — Z7951 Long term (current) use of inhaled steroids: Secondary | ICD-10-CM | POA: Diagnosis not present

## 2020-01-04 DIAGNOSIS — G2581 Restless legs syndrome: Secondary | ICD-10-CM | POA: Diagnosis not present

## 2020-01-13 DIAGNOSIS — Z7951 Long term (current) use of inhaled steroids: Secondary | ICD-10-CM | POA: Diagnosis not present

## 2020-01-13 DIAGNOSIS — I110 Hypertensive heart disease with heart failure: Secondary | ICD-10-CM | POA: Diagnosis not present

## 2020-01-13 DIAGNOSIS — I5032 Chronic diastolic (congestive) heart failure: Secondary | ICD-10-CM | POA: Diagnosis not present

## 2020-01-13 DIAGNOSIS — Z7982 Long term (current) use of aspirin: Secondary | ICD-10-CM | POA: Diagnosis not present

## 2020-01-13 DIAGNOSIS — Z4789 Encounter for other orthopedic aftercare: Secondary | ICD-10-CM | POA: Diagnosis not present

## 2020-01-13 DIAGNOSIS — E1151 Type 2 diabetes mellitus with diabetic peripheral angiopathy without gangrene: Secondary | ICD-10-CM | POA: Diagnosis not present

## 2020-01-13 DIAGNOSIS — M4800 Spinal stenosis, site unspecified: Secondary | ICD-10-CM | POA: Diagnosis not present

## 2020-01-13 DIAGNOSIS — J449 Chronic obstructive pulmonary disease, unspecified: Secondary | ICD-10-CM | POA: Diagnosis not present

## 2020-01-13 DIAGNOSIS — K7460 Unspecified cirrhosis of liver: Secondary | ICD-10-CM | POA: Diagnosis not present

## 2020-01-13 DIAGNOSIS — E1141 Type 2 diabetes mellitus with diabetic mononeuropathy: Secondary | ICD-10-CM | POA: Diagnosis not present

## 2020-01-13 DIAGNOSIS — Z87310 Personal history of (healed) osteoporosis fracture: Secondary | ICD-10-CM | POA: Diagnosis not present

## 2020-01-13 DIAGNOSIS — K8689 Other specified diseases of pancreas: Secondary | ICD-10-CM | POA: Diagnosis not present

## 2020-01-13 DIAGNOSIS — Z9181 History of falling: Secondary | ICD-10-CM | POA: Diagnosis not present

## 2020-01-13 DIAGNOSIS — E039 Hypothyroidism, unspecified: Secondary | ICD-10-CM | POA: Diagnosis not present

## 2020-01-13 DIAGNOSIS — R413 Other amnesia: Secondary | ICD-10-CM | POA: Diagnosis not present

## 2020-01-13 DIAGNOSIS — M80052D Age-related osteoporosis with current pathological fracture, left femur, subsequent encounter for fracture with routine healing: Secondary | ICD-10-CM | POA: Diagnosis not present

## 2020-01-13 DIAGNOSIS — M1991 Primary osteoarthritis, unspecified site: Secondary | ICD-10-CM | POA: Diagnosis not present

## 2020-01-13 DIAGNOSIS — I872 Venous insufficiency (chronic) (peripheral): Secondary | ICD-10-CM | POA: Diagnosis not present

## 2020-01-13 DIAGNOSIS — G2581 Restless legs syndrome: Secondary | ICD-10-CM | POA: Diagnosis not present

## 2020-01-18 DIAGNOSIS — R413 Other amnesia: Secondary | ICD-10-CM | POA: Diagnosis not present

## 2020-01-18 DIAGNOSIS — E1141 Type 2 diabetes mellitus with diabetic mononeuropathy: Secondary | ICD-10-CM | POA: Diagnosis not present

## 2020-01-18 DIAGNOSIS — K7460 Unspecified cirrhosis of liver: Secondary | ICD-10-CM | POA: Diagnosis not present

## 2020-01-18 DIAGNOSIS — I872 Venous insufficiency (chronic) (peripheral): Secondary | ICD-10-CM | POA: Diagnosis not present

## 2020-01-18 DIAGNOSIS — M80052D Age-related osteoporosis with current pathological fracture, left femur, subsequent encounter for fracture with routine healing: Secondary | ICD-10-CM | POA: Diagnosis not present

## 2020-01-18 DIAGNOSIS — Z4789 Encounter for other orthopedic aftercare: Secondary | ICD-10-CM | POA: Diagnosis not present

## 2020-01-18 DIAGNOSIS — Z7951 Long term (current) use of inhaled steroids: Secondary | ICD-10-CM | POA: Diagnosis not present

## 2020-01-18 DIAGNOSIS — M4800 Spinal stenosis, site unspecified: Secondary | ICD-10-CM | POA: Diagnosis not present

## 2020-01-18 DIAGNOSIS — E039 Hypothyroidism, unspecified: Secondary | ICD-10-CM | POA: Diagnosis not present

## 2020-01-18 DIAGNOSIS — I110 Hypertensive heart disease with heart failure: Secondary | ICD-10-CM | POA: Diagnosis not present

## 2020-01-18 DIAGNOSIS — Z87310 Personal history of (healed) osteoporosis fracture: Secondary | ICD-10-CM | POA: Diagnosis not present

## 2020-01-18 DIAGNOSIS — Z7982 Long term (current) use of aspirin: Secondary | ICD-10-CM | POA: Diagnosis not present

## 2020-01-18 DIAGNOSIS — J449 Chronic obstructive pulmonary disease, unspecified: Secondary | ICD-10-CM | POA: Diagnosis not present

## 2020-01-18 DIAGNOSIS — M1991 Primary osteoarthritis, unspecified site: Secondary | ICD-10-CM | POA: Diagnosis not present

## 2020-01-18 DIAGNOSIS — Z9181 History of falling: Secondary | ICD-10-CM | POA: Diagnosis not present

## 2020-01-18 DIAGNOSIS — I5032 Chronic diastolic (congestive) heart failure: Secondary | ICD-10-CM | POA: Diagnosis not present

## 2020-01-18 DIAGNOSIS — E1151 Type 2 diabetes mellitus with diabetic peripheral angiopathy without gangrene: Secondary | ICD-10-CM | POA: Diagnosis not present

## 2020-01-18 DIAGNOSIS — K8689 Other specified diseases of pancreas: Secondary | ICD-10-CM | POA: Diagnosis not present

## 2020-01-18 DIAGNOSIS — G2581 Restless legs syndrome: Secondary | ICD-10-CM | POA: Diagnosis not present

## 2020-01-25 DIAGNOSIS — E1141 Type 2 diabetes mellitus with diabetic mononeuropathy: Secondary | ICD-10-CM | POA: Diagnosis not present

## 2020-01-25 DIAGNOSIS — Z7951 Long term (current) use of inhaled steroids: Secondary | ICD-10-CM | POA: Diagnosis not present

## 2020-01-25 DIAGNOSIS — I110 Hypertensive heart disease with heart failure: Secondary | ICD-10-CM | POA: Diagnosis not present

## 2020-01-25 DIAGNOSIS — M80052D Age-related osteoporosis with current pathological fracture, left femur, subsequent encounter for fracture with routine healing: Secondary | ICD-10-CM | POA: Diagnosis not present

## 2020-01-25 DIAGNOSIS — I872 Venous insufficiency (chronic) (peripheral): Secondary | ICD-10-CM | POA: Diagnosis not present

## 2020-01-25 DIAGNOSIS — K7460 Unspecified cirrhosis of liver: Secondary | ICD-10-CM | POA: Diagnosis not present

## 2020-01-25 DIAGNOSIS — Z87310 Personal history of (healed) osteoporosis fracture: Secondary | ICD-10-CM | POA: Diagnosis not present

## 2020-01-25 DIAGNOSIS — K8689 Other specified diseases of pancreas: Secondary | ICD-10-CM | POA: Diagnosis not present

## 2020-01-25 DIAGNOSIS — M1991 Primary osteoarthritis, unspecified site: Secondary | ICD-10-CM | POA: Diagnosis not present

## 2020-01-25 DIAGNOSIS — I5032 Chronic diastolic (congestive) heart failure: Secondary | ICD-10-CM | POA: Diagnosis not present

## 2020-01-25 DIAGNOSIS — G2581 Restless legs syndrome: Secondary | ICD-10-CM | POA: Diagnosis not present

## 2020-01-25 DIAGNOSIS — Z4789 Encounter for other orthopedic aftercare: Secondary | ICD-10-CM | POA: Diagnosis not present

## 2020-01-25 DIAGNOSIS — M4800 Spinal stenosis, site unspecified: Secondary | ICD-10-CM | POA: Diagnosis not present

## 2020-01-25 DIAGNOSIS — R413 Other amnesia: Secondary | ICD-10-CM | POA: Diagnosis not present

## 2020-01-25 DIAGNOSIS — E1151 Type 2 diabetes mellitus with diabetic peripheral angiopathy without gangrene: Secondary | ICD-10-CM | POA: Diagnosis not present

## 2020-01-25 DIAGNOSIS — J449 Chronic obstructive pulmonary disease, unspecified: Secondary | ICD-10-CM | POA: Diagnosis not present

## 2020-01-25 DIAGNOSIS — Z7982 Long term (current) use of aspirin: Secondary | ICD-10-CM | POA: Diagnosis not present

## 2020-01-25 DIAGNOSIS — Z9181 History of falling: Secondary | ICD-10-CM | POA: Diagnosis not present

## 2020-01-25 DIAGNOSIS — E039 Hypothyroidism, unspecified: Secondary | ICD-10-CM | POA: Diagnosis not present

## 2020-01-27 DIAGNOSIS — M4800 Spinal stenosis, site unspecified: Secondary | ICD-10-CM | POA: Diagnosis not present

## 2020-01-27 DIAGNOSIS — I872 Venous insufficiency (chronic) (peripheral): Secondary | ICD-10-CM | POA: Diagnosis not present

## 2020-01-27 DIAGNOSIS — M80052D Age-related osteoporosis with current pathological fracture, left femur, subsequent encounter for fracture with routine healing: Secondary | ICD-10-CM | POA: Diagnosis not present

## 2020-01-27 DIAGNOSIS — Z87310 Personal history of (healed) osteoporosis fracture: Secondary | ICD-10-CM | POA: Diagnosis not present

## 2020-01-27 DIAGNOSIS — K7460 Unspecified cirrhosis of liver: Secondary | ICD-10-CM | POA: Diagnosis not present

## 2020-01-27 DIAGNOSIS — Z7951 Long term (current) use of inhaled steroids: Secondary | ICD-10-CM | POA: Diagnosis not present

## 2020-01-27 DIAGNOSIS — R413 Other amnesia: Secondary | ICD-10-CM | POA: Diagnosis not present

## 2020-01-27 DIAGNOSIS — M1991 Primary osteoarthritis, unspecified site: Secondary | ICD-10-CM | POA: Diagnosis not present

## 2020-01-27 DIAGNOSIS — G2581 Restless legs syndrome: Secondary | ICD-10-CM | POA: Diagnosis not present

## 2020-01-27 DIAGNOSIS — E039 Hypothyroidism, unspecified: Secondary | ICD-10-CM | POA: Diagnosis not present

## 2020-01-27 DIAGNOSIS — I5032 Chronic diastolic (congestive) heart failure: Secondary | ICD-10-CM | POA: Diagnosis not present

## 2020-01-27 DIAGNOSIS — Z4789 Encounter for other orthopedic aftercare: Secondary | ICD-10-CM | POA: Diagnosis not present

## 2020-01-27 DIAGNOSIS — J449 Chronic obstructive pulmonary disease, unspecified: Secondary | ICD-10-CM | POA: Diagnosis not present

## 2020-01-27 DIAGNOSIS — E1151 Type 2 diabetes mellitus with diabetic peripheral angiopathy without gangrene: Secondary | ICD-10-CM | POA: Diagnosis not present

## 2020-01-27 DIAGNOSIS — Z7982 Long term (current) use of aspirin: Secondary | ICD-10-CM | POA: Diagnosis not present

## 2020-01-27 DIAGNOSIS — I110 Hypertensive heart disease with heart failure: Secondary | ICD-10-CM | POA: Diagnosis not present

## 2020-01-27 DIAGNOSIS — K8689 Other specified diseases of pancreas: Secondary | ICD-10-CM | POA: Diagnosis not present

## 2020-01-27 DIAGNOSIS — Z9181 History of falling: Secondary | ICD-10-CM | POA: Diagnosis not present

## 2020-01-27 DIAGNOSIS — E1141 Type 2 diabetes mellitus with diabetic mononeuropathy: Secondary | ICD-10-CM | POA: Diagnosis not present

## 2020-01-28 DIAGNOSIS — S7292XD Unspecified fracture of left femur, subsequent encounter for closed fracture with routine healing: Secondary | ICD-10-CM | POA: Diagnosis not present

## 2020-02-01 DIAGNOSIS — E039 Hypothyroidism, unspecified: Secondary | ICD-10-CM | POA: Diagnosis not present

## 2020-02-01 DIAGNOSIS — I110 Hypertensive heart disease with heart failure: Secondary | ICD-10-CM | POA: Diagnosis not present

## 2020-02-01 DIAGNOSIS — M80052D Age-related osteoporosis with current pathological fracture, left femur, subsequent encounter for fracture with routine healing: Secondary | ICD-10-CM | POA: Diagnosis not present

## 2020-02-01 DIAGNOSIS — E1151 Type 2 diabetes mellitus with diabetic peripheral angiopathy without gangrene: Secondary | ICD-10-CM | POA: Diagnosis not present

## 2020-02-01 DIAGNOSIS — R413 Other amnesia: Secondary | ICD-10-CM | POA: Diagnosis not present

## 2020-02-01 DIAGNOSIS — Z87310 Personal history of (healed) osteoporosis fracture: Secondary | ICD-10-CM | POA: Diagnosis not present

## 2020-02-01 DIAGNOSIS — M4800 Spinal stenosis, site unspecified: Secondary | ICD-10-CM | POA: Diagnosis not present

## 2020-02-01 DIAGNOSIS — Z7982 Long term (current) use of aspirin: Secondary | ICD-10-CM | POA: Diagnosis not present

## 2020-02-01 DIAGNOSIS — I5032 Chronic diastolic (congestive) heart failure: Secondary | ICD-10-CM | POA: Diagnosis not present

## 2020-02-01 DIAGNOSIS — Z9181 History of falling: Secondary | ICD-10-CM | POA: Diagnosis not present

## 2020-02-01 DIAGNOSIS — J449 Chronic obstructive pulmonary disease, unspecified: Secondary | ICD-10-CM | POA: Diagnosis not present

## 2020-02-01 DIAGNOSIS — Z4789 Encounter for other orthopedic aftercare: Secondary | ICD-10-CM | POA: Diagnosis not present

## 2020-02-01 DIAGNOSIS — M1991 Primary osteoarthritis, unspecified site: Secondary | ICD-10-CM | POA: Diagnosis not present

## 2020-02-01 DIAGNOSIS — Z7951 Long term (current) use of inhaled steroids: Secondary | ICD-10-CM | POA: Diagnosis not present

## 2020-02-01 DIAGNOSIS — E1141 Type 2 diabetes mellitus with diabetic mononeuropathy: Secondary | ICD-10-CM | POA: Diagnosis not present

## 2020-02-01 DIAGNOSIS — G2581 Restless legs syndrome: Secondary | ICD-10-CM | POA: Diagnosis not present

## 2020-02-01 DIAGNOSIS — K7460 Unspecified cirrhosis of liver: Secondary | ICD-10-CM | POA: Diagnosis not present

## 2020-02-01 DIAGNOSIS — K8689 Other specified diseases of pancreas: Secondary | ICD-10-CM | POA: Diagnosis not present

## 2020-02-01 DIAGNOSIS — I872 Venous insufficiency (chronic) (peripheral): Secondary | ICD-10-CM | POA: Diagnosis not present

## 2020-02-03 DIAGNOSIS — K7460 Unspecified cirrhosis of liver: Secondary | ICD-10-CM | POA: Diagnosis not present

## 2020-02-03 DIAGNOSIS — Z4789 Encounter for other orthopedic aftercare: Secondary | ICD-10-CM | POA: Diagnosis not present

## 2020-02-03 DIAGNOSIS — I110 Hypertensive heart disease with heart failure: Secondary | ICD-10-CM | POA: Diagnosis not present

## 2020-02-03 DIAGNOSIS — K8689 Other specified diseases of pancreas: Secondary | ICD-10-CM | POA: Diagnosis not present

## 2020-02-03 DIAGNOSIS — G2581 Restless legs syndrome: Secondary | ICD-10-CM | POA: Diagnosis not present

## 2020-02-03 DIAGNOSIS — Z7982 Long term (current) use of aspirin: Secondary | ICD-10-CM | POA: Diagnosis not present

## 2020-02-03 DIAGNOSIS — I872 Venous insufficiency (chronic) (peripheral): Secondary | ICD-10-CM | POA: Diagnosis not present

## 2020-02-03 DIAGNOSIS — I5032 Chronic diastolic (congestive) heart failure: Secondary | ICD-10-CM | POA: Diagnosis not present

## 2020-02-03 DIAGNOSIS — Z7951 Long term (current) use of inhaled steroids: Secondary | ICD-10-CM | POA: Diagnosis not present

## 2020-02-03 DIAGNOSIS — M4800 Spinal stenosis, site unspecified: Secondary | ICD-10-CM | POA: Diagnosis not present

## 2020-02-03 DIAGNOSIS — J449 Chronic obstructive pulmonary disease, unspecified: Secondary | ICD-10-CM | POA: Diagnosis not present

## 2020-02-03 DIAGNOSIS — E1141 Type 2 diabetes mellitus with diabetic mononeuropathy: Secondary | ICD-10-CM | POA: Diagnosis not present

## 2020-02-03 DIAGNOSIS — M80052D Age-related osteoporosis with current pathological fracture, left femur, subsequent encounter for fracture with routine healing: Secondary | ICD-10-CM | POA: Diagnosis not present

## 2020-02-03 DIAGNOSIS — E039 Hypothyroidism, unspecified: Secondary | ICD-10-CM | POA: Diagnosis not present

## 2020-02-03 DIAGNOSIS — E1151 Type 2 diabetes mellitus with diabetic peripheral angiopathy without gangrene: Secondary | ICD-10-CM | POA: Diagnosis not present

## 2020-02-03 DIAGNOSIS — R413 Other amnesia: Secondary | ICD-10-CM | POA: Diagnosis not present

## 2020-02-03 DIAGNOSIS — Z87310 Personal history of (healed) osteoporosis fracture: Secondary | ICD-10-CM | POA: Diagnosis not present

## 2020-02-03 DIAGNOSIS — Z9181 History of falling: Secondary | ICD-10-CM | POA: Diagnosis not present

## 2020-02-03 DIAGNOSIS — M1991 Primary osteoarthritis, unspecified site: Secondary | ICD-10-CM | POA: Diagnosis not present

## 2020-02-08 DIAGNOSIS — K8689 Other specified diseases of pancreas: Secondary | ICD-10-CM | POA: Diagnosis not present

## 2020-02-08 DIAGNOSIS — Z4789 Encounter for other orthopedic aftercare: Secondary | ICD-10-CM | POA: Diagnosis not present

## 2020-02-08 DIAGNOSIS — K7460 Unspecified cirrhosis of liver: Secondary | ICD-10-CM | POA: Diagnosis not present

## 2020-02-08 DIAGNOSIS — M1991 Primary osteoarthritis, unspecified site: Secondary | ICD-10-CM | POA: Diagnosis not present

## 2020-02-08 DIAGNOSIS — G2581 Restless legs syndrome: Secondary | ICD-10-CM | POA: Diagnosis not present

## 2020-02-08 DIAGNOSIS — Z7951 Long term (current) use of inhaled steroids: Secondary | ICD-10-CM | POA: Diagnosis not present

## 2020-02-08 DIAGNOSIS — Z87310 Personal history of (healed) osteoporosis fracture: Secondary | ICD-10-CM | POA: Diagnosis not present

## 2020-02-08 DIAGNOSIS — Z9181 History of falling: Secondary | ICD-10-CM | POA: Diagnosis not present

## 2020-02-08 DIAGNOSIS — E1141 Type 2 diabetes mellitus with diabetic mononeuropathy: Secondary | ICD-10-CM | POA: Diagnosis not present

## 2020-02-08 DIAGNOSIS — R413 Other amnesia: Secondary | ICD-10-CM | POA: Diagnosis not present

## 2020-02-08 DIAGNOSIS — E039 Hypothyroidism, unspecified: Secondary | ICD-10-CM | POA: Diagnosis not present

## 2020-02-08 DIAGNOSIS — J449 Chronic obstructive pulmonary disease, unspecified: Secondary | ICD-10-CM | POA: Diagnosis not present

## 2020-02-08 DIAGNOSIS — M80052D Age-related osteoporosis with current pathological fracture, left femur, subsequent encounter for fracture with routine healing: Secondary | ICD-10-CM | POA: Diagnosis not present

## 2020-02-08 DIAGNOSIS — M4800 Spinal stenosis, site unspecified: Secondary | ICD-10-CM | POA: Diagnosis not present

## 2020-02-08 DIAGNOSIS — I110 Hypertensive heart disease with heart failure: Secondary | ICD-10-CM | POA: Diagnosis not present

## 2020-02-08 DIAGNOSIS — I872 Venous insufficiency (chronic) (peripheral): Secondary | ICD-10-CM | POA: Diagnosis not present

## 2020-02-08 DIAGNOSIS — E1151 Type 2 diabetes mellitus with diabetic peripheral angiopathy without gangrene: Secondary | ICD-10-CM | POA: Diagnosis not present

## 2020-02-08 DIAGNOSIS — I5032 Chronic diastolic (congestive) heart failure: Secondary | ICD-10-CM | POA: Diagnosis not present

## 2020-02-08 DIAGNOSIS — Z7982 Long term (current) use of aspirin: Secondary | ICD-10-CM | POA: Diagnosis not present

## 2020-02-09 DIAGNOSIS — I5032 Chronic diastolic (congestive) heart failure: Secondary | ICD-10-CM | POA: Diagnosis not present

## 2020-02-09 DIAGNOSIS — E1151 Type 2 diabetes mellitus with diabetic peripheral angiopathy without gangrene: Secondary | ICD-10-CM | POA: Diagnosis not present

## 2020-02-09 DIAGNOSIS — I872 Venous insufficiency (chronic) (peripheral): Secondary | ICD-10-CM | POA: Diagnosis not present

## 2020-02-09 DIAGNOSIS — M1991 Primary osteoarthritis, unspecified site: Secondary | ICD-10-CM | POA: Diagnosis not present

## 2020-02-09 DIAGNOSIS — E1141 Type 2 diabetes mellitus with diabetic mononeuropathy: Secondary | ICD-10-CM | POA: Diagnosis not present

## 2020-02-09 DIAGNOSIS — K7460 Unspecified cirrhosis of liver: Secondary | ICD-10-CM | POA: Diagnosis not present

## 2020-02-09 DIAGNOSIS — Z87310 Personal history of (healed) osteoporosis fracture: Secondary | ICD-10-CM | POA: Diagnosis not present

## 2020-02-09 DIAGNOSIS — M80052D Age-related osteoporosis with current pathological fracture, left femur, subsequent encounter for fracture with routine healing: Secondary | ICD-10-CM | POA: Diagnosis not present

## 2020-02-09 DIAGNOSIS — Z7982 Long term (current) use of aspirin: Secondary | ICD-10-CM | POA: Diagnosis not present

## 2020-02-09 DIAGNOSIS — I110 Hypertensive heart disease with heart failure: Secondary | ICD-10-CM | POA: Diagnosis not present

## 2020-02-09 DIAGNOSIS — Z4789 Encounter for other orthopedic aftercare: Secondary | ICD-10-CM | POA: Diagnosis not present

## 2020-02-09 DIAGNOSIS — J449 Chronic obstructive pulmonary disease, unspecified: Secondary | ICD-10-CM | POA: Diagnosis not present

## 2020-02-09 DIAGNOSIS — R413 Other amnesia: Secondary | ICD-10-CM | POA: Diagnosis not present

## 2020-02-09 DIAGNOSIS — G2581 Restless legs syndrome: Secondary | ICD-10-CM | POA: Diagnosis not present

## 2020-02-09 DIAGNOSIS — K8689 Other specified diseases of pancreas: Secondary | ICD-10-CM | POA: Diagnosis not present

## 2020-02-09 DIAGNOSIS — M4800 Spinal stenosis, site unspecified: Secondary | ICD-10-CM | POA: Diagnosis not present

## 2020-02-09 DIAGNOSIS — E039 Hypothyroidism, unspecified: Secondary | ICD-10-CM | POA: Diagnosis not present

## 2020-02-09 DIAGNOSIS — Z9181 History of falling: Secondary | ICD-10-CM | POA: Diagnosis not present

## 2020-02-09 DIAGNOSIS — Z7951 Long term (current) use of inhaled steroids: Secondary | ICD-10-CM | POA: Diagnosis not present

## 2020-02-15 DIAGNOSIS — M1991 Primary osteoarthritis, unspecified site: Secondary | ICD-10-CM | POA: Diagnosis not present

## 2020-02-15 DIAGNOSIS — Z87310 Personal history of (healed) osteoporosis fracture: Secondary | ICD-10-CM | POA: Diagnosis not present

## 2020-02-15 DIAGNOSIS — I5032 Chronic diastolic (congestive) heart failure: Secondary | ICD-10-CM | POA: Diagnosis not present

## 2020-02-15 DIAGNOSIS — K8689 Other specified diseases of pancreas: Secondary | ICD-10-CM | POA: Diagnosis not present

## 2020-02-15 DIAGNOSIS — J449 Chronic obstructive pulmonary disease, unspecified: Secondary | ICD-10-CM | POA: Diagnosis not present

## 2020-02-15 DIAGNOSIS — Z7982 Long term (current) use of aspirin: Secondary | ICD-10-CM | POA: Diagnosis not present

## 2020-02-15 DIAGNOSIS — I110 Hypertensive heart disease with heart failure: Secondary | ICD-10-CM | POA: Diagnosis not present

## 2020-02-15 DIAGNOSIS — E1151 Type 2 diabetes mellitus with diabetic peripheral angiopathy without gangrene: Secondary | ICD-10-CM | POA: Diagnosis not present

## 2020-02-15 DIAGNOSIS — R413 Other amnesia: Secondary | ICD-10-CM | POA: Diagnosis not present

## 2020-02-15 DIAGNOSIS — E039 Hypothyroidism, unspecified: Secondary | ICD-10-CM | POA: Diagnosis not present

## 2020-02-15 DIAGNOSIS — M4800 Spinal stenosis, site unspecified: Secondary | ICD-10-CM | POA: Diagnosis not present

## 2020-02-15 DIAGNOSIS — Z9181 History of falling: Secondary | ICD-10-CM | POA: Diagnosis not present

## 2020-02-15 DIAGNOSIS — Z7951 Long term (current) use of inhaled steroids: Secondary | ICD-10-CM | POA: Diagnosis not present

## 2020-02-15 DIAGNOSIS — Z4789 Encounter for other orthopedic aftercare: Secondary | ICD-10-CM | POA: Diagnosis not present

## 2020-02-15 DIAGNOSIS — K7460 Unspecified cirrhosis of liver: Secondary | ICD-10-CM | POA: Diagnosis not present

## 2020-02-15 DIAGNOSIS — I872 Venous insufficiency (chronic) (peripheral): Secondary | ICD-10-CM | POA: Diagnosis not present

## 2020-02-15 DIAGNOSIS — G2581 Restless legs syndrome: Secondary | ICD-10-CM | POA: Diagnosis not present

## 2020-02-15 DIAGNOSIS — M80052D Age-related osteoporosis with current pathological fracture, left femur, subsequent encounter for fracture with routine healing: Secondary | ICD-10-CM | POA: Diagnosis not present

## 2020-02-15 DIAGNOSIS — E1141 Type 2 diabetes mellitus with diabetic mononeuropathy: Secondary | ICD-10-CM | POA: Diagnosis not present

## 2020-02-17 DIAGNOSIS — M4800 Spinal stenosis, site unspecified: Secondary | ICD-10-CM | POA: Diagnosis not present

## 2020-02-17 DIAGNOSIS — E1141 Type 2 diabetes mellitus with diabetic mononeuropathy: Secondary | ICD-10-CM | POA: Diagnosis not present

## 2020-02-17 DIAGNOSIS — E039 Hypothyroidism, unspecified: Secondary | ICD-10-CM | POA: Diagnosis not present

## 2020-02-17 DIAGNOSIS — Z7951 Long term (current) use of inhaled steroids: Secondary | ICD-10-CM | POA: Diagnosis not present

## 2020-02-17 DIAGNOSIS — E1151 Type 2 diabetes mellitus with diabetic peripheral angiopathy without gangrene: Secondary | ICD-10-CM | POA: Diagnosis not present

## 2020-02-17 DIAGNOSIS — R413 Other amnesia: Secondary | ICD-10-CM | POA: Diagnosis not present

## 2020-02-17 DIAGNOSIS — Z7982 Long term (current) use of aspirin: Secondary | ICD-10-CM | POA: Diagnosis not present

## 2020-02-17 DIAGNOSIS — J449 Chronic obstructive pulmonary disease, unspecified: Secondary | ICD-10-CM | POA: Diagnosis not present

## 2020-02-17 DIAGNOSIS — Z87310 Personal history of (healed) osteoporosis fracture: Secondary | ICD-10-CM | POA: Diagnosis not present

## 2020-02-17 DIAGNOSIS — Z4789 Encounter for other orthopedic aftercare: Secondary | ICD-10-CM | POA: Diagnosis not present

## 2020-02-17 DIAGNOSIS — M1991 Primary osteoarthritis, unspecified site: Secondary | ICD-10-CM | POA: Diagnosis not present

## 2020-02-17 DIAGNOSIS — I110 Hypertensive heart disease with heart failure: Secondary | ICD-10-CM | POA: Diagnosis not present

## 2020-02-17 DIAGNOSIS — I872 Venous insufficiency (chronic) (peripheral): Secondary | ICD-10-CM | POA: Diagnosis not present

## 2020-02-17 DIAGNOSIS — G2581 Restless legs syndrome: Secondary | ICD-10-CM | POA: Diagnosis not present

## 2020-02-17 DIAGNOSIS — K8689 Other specified diseases of pancreas: Secondary | ICD-10-CM | POA: Diagnosis not present

## 2020-02-17 DIAGNOSIS — K7460 Unspecified cirrhosis of liver: Secondary | ICD-10-CM | POA: Diagnosis not present

## 2020-02-17 DIAGNOSIS — Z9181 History of falling: Secondary | ICD-10-CM | POA: Diagnosis not present

## 2020-02-17 DIAGNOSIS — I5032 Chronic diastolic (congestive) heart failure: Secondary | ICD-10-CM | POA: Diagnosis not present

## 2020-02-17 DIAGNOSIS — M80052D Age-related osteoporosis with current pathological fracture, left femur, subsequent encounter for fracture with routine healing: Secondary | ICD-10-CM | POA: Diagnosis not present

## 2020-02-22 DIAGNOSIS — M80052D Age-related osteoporosis with current pathological fracture, left femur, subsequent encounter for fracture with routine healing: Secondary | ICD-10-CM | POA: Diagnosis not present

## 2020-02-22 DIAGNOSIS — E1151 Type 2 diabetes mellitus with diabetic peripheral angiopathy without gangrene: Secondary | ICD-10-CM | POA: Diagnosis not present

## 2020-02-22 DIAGNOSIS — K7460 Unspecified cirrhosis of liver: Secondary | ICD-10-CM | POA: Diagnosis not present

## 2020-02-22 DIAGNOSIS — J449 Chronic obstructive pulmonary disease, unspecified: Secondary | ICD-10-CM | POA: Diagnosis not present

## 2020-02-22 DIAGNOSIS — I110 Hypertensive heart disease with heart failure: Secondary | ICD-10-CM | POA: Diagnosis not present

## 2020-02-22 DIAGNOSIS — E1141 Type 2 diabetes mellitus with diabetic mononeuropathy: Secondary | ICD-10-CM | POA: Diagnosis not present

## 2020-02-22 DIAGNOSIS — K8689 Other specified diseases of pancreas: Secondary | ICD-10-CM | POA: Diagnosis not present

## 2020-02-22 DIAGNOSIS — Z4789 Encounter for other orthopedic aftercare: Secondary | ICD-10-CM | POA: Diagnosis not present

## 2020-02-22 DIAGNOSIS — R413 Other amnesia: Secondary | ICD-10-CM | POA: Diagnosis not present

## 2020-02-22 DIAGNOSIS — Z7982 Long term (current) use of aspirin: Secondary | ICD-10-CM | POA: Diagnosis not present

## 2020-02-22 DIAGNOSIS — Z7951 Long term (current) use of inhaled steroids: Secondary | ICD-10-CM | POA: Diagnosis not present

## 2020-02-22 DIAGNOSIS — Z87310 Personal history of (healed) osteoporosis fracture: Secondary | ICD-10-CM | POA: Diagnosis not present

## 2020-02-22 DIAGNOSIS — G2581 Restless legs syndrome: Secondary | ICD-10-CM | POA: Diagnosis not present

## 2020-02-22 DIAGNOSIS — Z9181 History of falling: Secondary | ICD-10-CM | POA: Diagnosis not present

## 2020-02-22 DIAGNOSIS — E039 Hypothyroidism, unspecified: Secondary | ICD-10-CM | POA: Diagnosis not present

## 2020-02-22 DIAGNOSIS — M4800 Spinal stenosis, site unspecified: Secondary | ICD-10-CM | POA: Diagnosis not present

## 2020-02-22 DIAGNOSIS — I5032 Chronic diastolic (congestive) heart failure: Secondary | ICD-10-CM | POA: Diagnosis not present

## 2020-02-22 DIAGNOSIS — I872 Venous insufficiency (chronic) (peripheral): Secondary | ICD-10-CM | POA: Diagnosis not present

## 2020-02-22 DIAGNOSIS — M1991 Primary osteoarthritis, unspecified site: Secondary | ICD-10-CM | POA: Diagnosis not present

## 2020-02-24 DIAGNOSIS — M4800 Spinal stenosis, site unspecified: Secondary | ICD-10-CM | POA: Diagnosis not present

## 2020-02-24 DIAGNOSIS — K8689 Other specified diseases of pancreas: Secondary | ICD-10-CM | POA: Diagnosis not present

## 2020-02-24 DIAGNOSIS — I110 Hypertensive heart disease with heart failure: Secondary | ICD-10-CM | POA: Diagnosis not present

## 2020-02-24 DIAGNOSIS — Z9181 History of falling: Secondary | ICD-10-CM | POA: Diagnosis not present

## 2020-02-24 DIAGNOSIS — E1151 Type 2 diabetes mellitus with diabetic peripheral angiopathy without gangrene: Secondary | ICD-10-CM | POA: Diagnosis not present

## 2020-02-24 DIAGNOSIS — K7460 Unspecified cirrhosis of liver: Secondary | ICD-10-CM | POA: Diagnosis not present

## 2020-02-24 DIAGNOSIS — Z4789 Encounter for other orthopedic aftercare: Secondary | ICD-10-CM | POA: Diagnosis not present

## 2020-02-24 DIAGNOSIS — J449 Chronic obstructive pulmonary disease, unspecified: Secondary | ICD-10-CM | POA: Diagnosis not present

## 2020-02-24 DIAGNOSIS — M80052D Age-related osteoporosis with current pathological fracture, left femur, subsequent encounter for fracture with routine healing: Secondary | ICD-10-CM | POA: Diagnosis not present

## 2020-02-24 DIAGNOSIS — E039 Hypothyroidism, unspecified: Secondary | ICD-10-CM | POA: Diagnosis not present

## 2020-02-24 DIAGNOSIS — Z7982 Long term (current) use of aspirin: Secondary | ICD-10-CM | POA: Diagnosis not present

## 2020-02-24 DIAGNOSIS — M1991 Primary osteoarthritis, unspecified site: Secondary | ICD-10-CM | POA: Diagnosis not present

## 2020-02-24 DIAGNOSIS — R413 Other amnesia: Secondary | ICD-10-CM | POA: Diagnosis not present

## 2020-02-24 DIAGNOSIS — Z87310 Personal history of (healed) osteoporosis fracture: Secondary | ICD-10-CM | POA: Diagnosis not present

## 2020-02-24 DIAGNOSIS — Z7951 Long term (current) use of inhaled steroids: Secondary | ICD-10-CM | POA: Diagnosis not present

## 2020-02-24 DIAGNOSIS — G2581 Restless legs syndrome: Secondary | ICD-10-CM | POA: Diagnosis not present

## 2020-02-24 DIAGNOSIS — I5032 Chronic diastolic (congestive) heart failure: Secondary | ICD-10-CM | POA: Diagnosis not present

## 2020-02-24 DIAGNOSIS — E1141 Type 2 diabetes mellitus with diabetic mononeuropathy: Secondary | ICD-10-CM | POA: Diagnosis not present

## 2020-02-24 DIAGNOSIS — I872 Venous insufficiency (chronic) (peripheral): Secondary | ICD-10-CM | POA: Diagnosis not present

## 2020-02-25 DIAGNOSIS — M1712 Unilateral primary osteoarthritis, left knee: Secondary | ICD-10-CM | POA: Diagnosis not present

## 2020-02-25 DIAGNOSIS — M1711 Unilateral primary osteoarthritis, right knee: Secondary | ICD-10-CM | POA: Diagnosis not present

## 2020-02-27 DIAGNOSIS — S7292XD Unspecified fracture of left femur, subsequent encounter for closed fracture with routine healing: Secondary | ICD-10-CM | POA: Diagnosis not present

## 2020-02-28 DIAGNOSIS — K7460 Unspecified cirrhosis of liver: Secondary | ICD-10-CM | POA: Diagnosis not present

## 2020-02-28 DIAGNOSIS — I872 Venous insufficiency (chronic) (peripheral): Secondary | ICD-10-CM | POA: Diagnosis not present

## 2020-02-28 DIAGNOSIS — Z4789 Encounter for other orthopedic aftercare: Secondary | ICD-10-CM | POA: Diagnosis not present

## 2020-02-28 DIAGNOSIS — G2581 Restless legs syndrome: Secondary | ICD-10-CM | POA: Diagnosis not present

## 2020-02-28 DIAGNOSIS — J449 Chronic obstructive pulmonary disease, unspecified: Secondary | ICD-10-CM | POA: Diagnosis not present

## 2020-02-28 DIAGNOSIS — M1991 Primary osteoarthritis, unspecified site: Secondary | ICD-10-CM | POA: Diagnosis not present

## 2020-02-28 DIAGNOSIS — E1141 Type 2 diabetes mellitus with diabetic mononeuropathy: Secondary | ICD-10-CM | POA: Diagnosis not present

## 2020-02-28 DIAGNOSIS — Z7982 Long term (current) use of aspirin: Secondary | ICD-10-CM | POA: Diagnosis not present

## 2020-02-28 DIAGNOSIS — R413 Other amnesia: Secondary | ICD-10-CM | POA: Diagnosis not present

## 2020-02-28 DIAGNOSIS — E1151 Type 2 diabetes mellitus with diabetic peripheral angiopathy without gangrene: Secondary | ICD-10-CM | POA: Diagnosis not present

## 2020-02-28 DIAGNOSIS — Z9181 History of falling: Secondary | ICD-10-CM | POA: Diagnosis not present

## 2020-02-28 DIAGNOSIS — I5032 Chronic diastolic (congestive) heart failure: Secondary | ICD-10-CM | POA: Diagnosis not present

## 2020-02-28 DIAGNOSIS — I110 Hypertensive heart disease with heart failure: Secondary | ICD-10-CM | POA: Diagnosis not present

## 2020-02-28 DIAGNOSIS — Z7951 Long term (current) use of inhaled steroids: Secondary | ICD-10-CM | POA: Diagnosis not present

## 2020-02-28 DIAGNOSIS — E039 Hypothyroidism, unspecified: Secondary | ICD-10-CM | POA: Diagnosis not present

## 2020-02-28 DIAGNOSIS — K8689 Other specified diseases of pancreas: Secondary | ICD-10-CM | POA: Diagnosis not present

## 2020-02-28 DIAGNOSIS — M4800 Spinal stenosis, site unspecified: Secondary | ICD-10-CM | POA: Diagnosis not present

## 2020-02-28 DIAGNOSIS — Z87310 Personal history of (healed) osteoporosis fracture: Secondary | ICD-10-CM | POA: Diagnosis not present

## 2020-02-28 DIAGNOSIS — M80052D Age-related osteoporosis with current pathological fracture, left femur, subsequent encounter for fracture with routine healing: Secondary | ICD-10-CM | POA: Diagnosis not present

## 2020-03-01 DIAGNOSIS — G2581 Restless legs syndrome: Secondary | ICD-10-CM | POA: Diagnosis not present

## 2020-03-01 DIAGNOSIS — E1151 Type 2 diabetes mellitus with diabetic peripheral angiopathy without gangrene: Secondary | ICD-10-CM | POA: Diagnosis not present

## 2020-03-01 DIAGNOSIS — J449 Chronic obstructive pulmonary disease, unspecified: Secondary | ICD-10-CM | POA: Diagnosis not present

## 2020-03-01 DIAGNOSIS — K7460 Unspecified cirrhosis of liver: Secondary | ICD-10-CM | POA: Diagnosis not present

## 2020-03-01 DIAGNOSIS — M1991 Primary osteoarthritis, unspecified site: Secondary | ICD-10-CM | POA: Diagnosis not present

## 2020-03-01 DIAGNOSIS — Z4789 Encounter for other orthopedic aftercare: Secondary | ICD-10-CM | POA: Diagnosis not present

## 2020-03-01 DIAGNOSIS — Z87310 Personal history of (healed) osteoporosis fracture: Secondary | ICD-10-CM | POA: Diagnosis not present

## 2020-03-01 DIAGNOSIS — R413 Other amnesia: Secondary | ICD-10-CM | POA: Diagnosis not present

## 2020-03-01 DIAGNOSIS — K8689 Other specified diseases of pancreas: Secondary | ICD-10-CM | POA: Diagnosis not present

## 2020-03-01 DIAGNOSIS — Z7982 Long term (current) use of aspirin: Secondary | ICD-10-CM | POA: Diagnosis not present

## 2020-03-01 DIAGNOSIS — E1141 Type 2 diabetes mellitus with diabetic mononeuropathy: Secondary | ICD-10-CM | POA: Diagnosis not present

## 2020-03-01 DIAGNOSIS — I5032 Chronic diastolic (congestive) heart failure: Secondary | ICD-10-CM | POA: Diagnosis not present

## 2020-03-01 DIAGNOSIS — I110 Hypertensive heart disease with heart failure: Secondary | ICD-10-CM | POA: Diagnosis not present

## 2020-03-01 DIAGNOSIS — M4800 Spinal stenosis, site unspecified: Secondary | ICD-10-CM | POA: Diagnosis not present

## 2020-03-01 DIAGNOSIS — Z9181 History of falling: Secondary | ICD-10-CM | POA: Diagnosis not present

## 2020-03-01 DIAGNOSIS — M80052D Age-related osteoporosis with current pathological fracture, left femur, subsequent encounter for fracture with routine healing: Secondary | ICD-10-CM | POA: Diagnosis not present

## 2020-03-01 DIAGNOSIS — I872 Venous insufficiency (chronic) (peripheral): Secondary | ICD-10-CM | POA: Diagnosis not present

## 2020-03-01 DIAGNOSIS — Z7951 Long term (current) use of inhaled steroids: Secondary | ICD-10-CM | POA: Diagnosis not present

## 2020-03-01 DIAGNOSIS — E039 Hypothyroidism, unspecified: Secondary | ICD-10-CM | POA: Diagnosis not present

## 2020-03-07 DIAGNOSIS — M1991 Primary osteoarthritis, unspecified site: Secondary | ICD-10-CM | POA: Diagnosis not present

## 2020-03-07 DIAGNOSIS — J449 Chronic obstructive pulmonary disease, unspecified: Secondary | ICD-10-CM | POA: Diagnosis not present

## 2020-03-07 DIAGNOSIS — I110 Hypertensive heart disease with heart failure: Secondary | ICD-10-CM | POA: Diagnosis not present

## 2020-03-07 DIAGNOSIS — Z9181 History of falling: Secondary | ICD-10-CM | POA: Diagnosis not present

## 2020-03-07 DIAGNOSIS — Z4789 Encounter for other orthopedic aftercare: Secondary | ICD-10-CM | POA: Diagnosis not present

## 2020-03-07 DIAGNOSIS — I872 Venous insufficiency (chronic) (peripheral): Secondary | ICD-10-CM | POA: Diagnosis not present

## 2020-03-07 DIAGNOSIS — M4800 Spinal stenosis, site unspecified: Secondary | ICD-10-CM | POA: Diagnosis not present

## 2020-03-07 DIAGNOSIS — Z7982 Long term (current) use of aspirin: Secondary | ICD-10-CM | POA: Diagnosis not present

## 2020-03-07 DIAGNOSIS — Z87310 Personal history of (healed) osteoporosis fracture: Secondary | ICD-10-CM | POA: Diagnosis not present

## 2020-03-07 DIAGNOSIS — G2581 Restless legs syndrome: Secondary | ICD-10-CM | POA: Diagnosis not present

## 2020-03-07 DIAGNOSIS — M80052D Age-related osteoporosis with current pathological fracture, left femur, subsequent encounter for fracture with routine healing: Secondary | ICD-10-CM | POA: Diagnosis not present

## 2020-03-07 DIAGNOSIS — K7460 Unspecified cirrhosis of liver: Secondary | ICD-10-CM | POA: Diagnosis not present

## 2020-03-07 DIAGNOSIS — E1141 Type 2 diabetes mellitus with diabetic mononeuropathy: Secondary | ICD-10-CM | POA: Diagnosis not present

## 2020-03-07 DIAGNOSIS — E1151 Type 2 diabetes mellitus with diabetic peripheral angiopathy without gangrene: Secondary | ICD-10-CM | POA: Diagnosis not present

## 2020-03-07 DIAGNOSIS — E039 Hypothyroidism, unspecified: Secondary | ICD-10-CM | POA: Diagnosis not present

## 2020-03-07 DIAGNOSIS — Z7951 Long term (current) use of inhaled steroids: Secondary | ICD-10-CM | POA: Diagnosis not present

## 2020-03-07 DIAGNOSIS — I5032 Chronic diastolic (congestive) heart failure: Secondary | ICD-10-CM | POA: Diagnosis not present

## 2020-03-07 DIAGNOSIS — R413 Other amnesia: Secondary | ICD-10-CM | POA: Diagnosis not present

## 2020-03-07 DIAGNOSIS — K8689 Other specified diseases of pancreas: Secondary | ICD-10-CM | POA: Diagnosis not present

## 2020-03-14 DIAGNOSIS — Z9181 History of falling: Secondary | ICD-10-CM | POA: Diagnosis not present

## 2020-03-14 DIAGNOSIS — G2581 Restless legs syndrome: Secondary | ICD-10-CM | POA: Diagnosis not present

## 2020-03-14 DIAGNOSIS — I110 Hypertensive heart disease with heart failure: Secondary | ICD-10-CM | POA: Diagnosis not present

## 2020-03-14 DIAGNOSIS — K7460 Unspecified cirrhosis of liver: Secondary | ICD-10-CM | POA: Diagnosis not present

## 2020-03-14 DIAGNOSIS — J449 Chronic obstructive pulmonary disease, unspecified: Secondary | ICD-10-CM | POA: Diagnosis not present

## 2020-03-14 DIAGNOSIS — I5032 Chronic diastolic (congestive) heart failure: Secondary | ICD-10-CM | POA: Diagnosis not present

## 2020-03-14 DIAGNOSIS — M80052D Age-related osteoporosis with current pathological fracture, left femur, subsequent encounter for fracture with routine healing: Secondary | ICD-10-CM | POA: Diagnosis not present

## 2020-03-14 DIAGNOSIS — I872 Venous insufficiency (chronic) (peripheral): Secondary | ICD-10-CM | POA: Diagnosis not present

## 2020-03-14 DIAGNOSIS — Z4789 Encounter for other orthopedic aftercare: Secondary | ICD-10-CM | POA: Diagnosis not present

## 2020-03-14 DIAGNOSIS — M4800 Spinal stenosis, site unspecified: Secondary | ICD-10-CM | POA: Diagnosis not present

## 2020-03-14 DIAGNOSIS — Z87310 Personal history of (healed) osteoporosis fracture: Secondary | ICD-10-CM | POA: Diagnosis not present

## 2020-03-14 DIAGNOSIS — Z7982 Long term (current) use of aspirin: Secondary | ICD-10-CM | POA: Diagnosis not present

## 2020-03-14 DIAGNOSIS — K8689 Other specified diseases of pancreas: Secondary | ICD-10-CM | POA: Diagnosis not present

## 2020-03-14 DIAGNOSIS — E039 Hypothyroidism, unspecified: Secondary | ICD-10-CM | POA: Diagnosis not present

## 2020-03-14 DIAGNOSIS — E1151 Type 2 diabetes mellitus with diabetic peripheral angiopathy without gangrene: Secondary | ICD-10-CM | POA: Diagnosis not present

## 2020-03-14 DIAGNOSIS — E1141 Type 2 diabetes mellitus with diabetic mononeuropathy: Secondary | ICD-10-CM | POA: Diagnosis not present

## 2020-03-14 DIAGNOSIS — M1991 Primary osteoarthritis, unspecified site: Secondary | ICD-10-CM | POA: Diagnosis not present

## 2020-03-14 DIAGNOSIS — R413 Other amnesia: Secondary | ICD-10-CM | POA: Diagnosis not present

## 2020-03-14 DIAGNOSIS — Z7951 Long term (current) use of inhaled steroids: Secondary | ICD-10-CM | POA: Diagnosis not present

## 2020-03-21 DIAGNOSIS — Z87310 Personal history of (healed) osteoporosis fracture: Secondary | ICD-10-CM | POA: Diagnosis not present

## 2020-03-21 DIAGNOSIS — Z7951 Long term (current) use of inhaled steroids: Secondary | ICD-10-CM | POA: Diagnosis not present

## 2020-03-21 DIAGNOSIS — E1151 Type 2 diabetes mellitus with diabetic peripheral angiopathy without gangrene: Secondary | ICD-10-CM | POA: Diagnosis not present

## 2020-03-21 DIAGNOSIS — E1141 Type 2 diabetes mellitus with diabetic mononeuropathy: Secondary | ICD-10-CM | POA: Diagnosis not present

## 2020-03-21 DIAGNOSIS — Z9181 History of falling: Secondary | ICD-10-CM | POA: Diagnosis not present

## 2020-03-21 DIAGNOSIS — K8689 Other specified diseases of pancreas: Secondary | ICD-10-CM | POA: Diagnosis not present

## 2020-03-21 DIAGNOSIS — Z4789 Encounter for other orthopedic aftercare: Secondary | ICD-10-CM | POA: Diagnosis not present

## 2020-03-21 DIAGNOSIS — I110 Hypertensive heart disease with heart failure: Secondary | ICD-10-CM | POA: Diagnosis not present

## 2020-03-21 DIAGNOSIS — J449 Chronic obstructive pulmonary disease, unspecified: Secondary | ICD-10-CM | POA: Diagnosis not present

## 2020-03-21 DIAGNOSIS — I5032 Chronic diastolic (congestive) heart failure: Secondary | ICD-10-CM | POA: Diagnosis not present

## 2020-03-21 DIAGNOSIS — I872 Venous insufficiency (chronic) (peripheral): Secondary | ICD-10-CM | POA: Diagnosis not present

## 2020-03-21 DIAGNOSIS — E039 Hypothyroidism, unspecified: Secondary | ICD-10-CM | POA: Diagnosis not present

## 2020-03-21 DIAGNOSIS — K7460 Unspecified cirrhosis of liver: Secondary | ICD-10-CM | POA: Diagnosis not present

## 2020-03-21 DIAGNOSIS — R413 Other amnesia: Secondary | ICD-10-CM | POA: Diagnosis not present

## 2020-03-21 DIAGNOSIS — Z7982 Long term (current) use of aspirin: Secondary | ICD-10-CM | POA: Diagnosis not present

## 2020-03-21 DIAGNOSIS — M80052D Age-related osteoporosis with current pathological fracture, left femur, subsequent encounter for fracture with routine healing: Secondary | ICD-10-CM | POA: Diagnosis not present

## 2020-03-21 DIAGNOSIS — M1991 Primary osteoarthritis, unspecified site: Secondary | ICD-10-CM | POA: Diagnosis not present

## 2020-03-21 DIAGNOSIS — G2581 Restless legs syndrome: Secondary | ICD-10-CM | POA: Diagnosis not present

## 2020-03-21 DIAGNOSIS — M4800 Spinal stenosis, site unspecified: Secondary | ICD-10-CM | POA: Diagnosis not present

## 2020-03-23 DIAGNOSIS — K8689 Other specified diseases of pancreas: Secondary | ICD-10-CM | POA: Diagnosis not present

## 2020-03-23 DIAGNOSIS — K7460 Unspecified cirrhosis of liver: Secondary | ICD-10-CM | POA: Diagnosis not present

## 2020-03-23 DIAGNOSIS — E1141 Type 2 diabetes mellitus with diabetic mononeuropathy: Secondary | ICD-10-CM | POA: Diagnosis not present

## 2020-03-23 DIAGNOSIS — M80052D Age-related osteoporosis with current pathological fracture, left femur, subsequent encounter for fracture with routine healing: Secondary | ICD-10-CM | POA: Diagnosis not present

## 2020-03-23 DIAGNOSIS — J449 Chronic obstructive pulmonary disease, unspecified: Secondary | ICD-10-CM | POA: Diagnosis not present

## 2020-03-23 DIAGNOSIS — R413 Other amnesia: Secondary | ICD-10-CM | POA: Diagnosis not present

## 2020-03-23 DIAGNOSIS — Z7951 Long term (current) use of inhaled steroids: Secondary | ICD-10-CM | POA: Diagnosis not present

## 2020-03-23 DIAGNOSIS — Z9181 History of falling: Secondary | ICD-10-CM | POA: Diagnosis not present

## 2020-03-23 DIAGNOSIS — Z7982 Long term (current) use of aspirin: Secondary | ICD-10-CM | POA: Diagnosis not present

## 2020-03-23 DIAGNOSIS — E039 Hypothyroidism, unspecified: Secondary | ICD-10-CM | POA: Diagnosis not present

## 2020-03-23 DIAGNOSIS — M1991 Primary osteoarthritis, unspecified site: Secondary | ICD-10-CM | POA: Diagnosis not present

## 2020-03-23 DIAGNOSIS — I5032 Chronic diastolic (congestive) heart failure: Secondary | ICD-10-CM | POA: Diagnosis not present

## 2020-03-23 DIAGNOSIS — Z87310 Personal history of (healed) osteoporosis fracture: Secondary | ICD-10-CM | POA: Diagnosis not present

## 2020-03-23 DIAGNOSIS — Z4789 Encounter for other orthopedic aftercare: Secondary | ICD-10-CM | POA: Diagnosis not present

## 2020-03-23 DIAGNOSIS — E1151 Type 2 diabetes mellitus with diabetic peripheral angiopathy without gangrene: Secondary | ICD-10-CM | POA: Diagnosis not present

## 2020-03-23 DIAGNOSIS — G2581 Restless legs syndrome: Secondary | ICD-10-CM | POA: Diagnosis not present

## 2020-03-23 DIAGNOSIS — I110 Hypertensive heart disease with heart failure: Secondary | ICD-10-CM | POA: Diagnosis not present

## 2020-03-23 DIAGNOSIS — M4800 Spinal stenosis, site unspecified: Secondary | ICD-10-CM | POA: Diagnosis not present

## 2020-03-23 DIAGNOSIS — I872 Venous insufficiency (chronic) (peripheral): Secondary | ICD-10-CM | POA: Diagnosis not present

## 2020-03-24 DIAGNOSIS — Z87310 Personal history of (healed) osteoporosis fracture: Secondary | ICD-10-CM | POA: Diagnosis not present

## 2020-03-24 DIAGNOSIS — K7460 Unspecified cirrhosis of liver: Secondary | ICD-10-CM | POA: Diagnosis not present

## 2020-03-24 DIAGNOSIS — R300 Dysuria: Secondary | ICD-10-CM | POA: Diagnosis not present

## 2020-03-24 DIAGNOSIS — M4800 Spinal stenosis, site unspecified: Secondary | ICD-10-CM | POA: Diagnosis not present

## 2020-03-24 DIAGNOSIS — R413 Other amnesia: Secondary | ICD-10-CM | POA: Diagnosis not present

## 2020-03-24 DIAGNOSIS — M80052D Age-related osteoporosis with current pathological fracture, left femur, subsequent encounter for fracture with routine healing: Secondary | ICD-10-CM | POA: Diagnosis not present

## 2020-03-24 DIAGNOSIS — E1141 Type 2 diabetes mellitus with diabetic mononeuropathy: Secondary | ICD-10-CM | POA: Diagnosis not present

## 2020-03-24 DIAGNOSIS — I872 Venous insufficiency (chronic) (peripheral): Secondary | ICD-10-CM | POA: Diagnosis not present

## 2020-03-24 DIAGNOSIS — E039 Hypothyroidism, unspecified: Secondary | ICD-10-CM | POA: Diagnosis not present

## 2020-03-24 DIAGNOSIS — J449 Chronic obstructive pulmonary disease, unspecified: Secondary | ICD-10-CM | POA: Diagnosis not present

## 2020-03-24 DIAGNOSIS — Z4789 Encounter for other orthopedic aftercare: Secondary | ICD-10-CM | POA: Diagnosis not present

## 2020-03-24 DIAGNOSIS — G2581 Restless legs syndrome: Secondary | ICD-10-CM | POA: Diagnosis not present

## 2020-03-24 DIAGNOSIS — Z7951 Long term (current) use of inhaled steroids: Secondary | ICD-10-CM | POA: Diagnosis not present

## 2020-03-24 DIAGNOSIS — I5032 Chronic diastolic (congestive) heart failure: Secondary | ICD-10-CM | POA: Diagnosis not present

## 2020-03-24 DIAGNOSIS — M1991 Primary osteoarthritis, unspecified site: Secondary | ICD-10-CM | POA: Diagnosis not present

## 2020-03-24 DIAGNOSIS — K8689 Other specified diseases of pancreas: Secondary | ICD-10-CM | POA: Diagnosis not present

## 2020-03-24 DIAGNOSIS — Z9181 History of falling: Secondary | ICD-10-CM | POA: Diagnosis not present

## 2020-03-24 DIAGNOSIS — I110 Hypertensive heart disease with heart failure: Secondary | ICD-10-CM | POA: Diagnosis not present

## 2020-03-24 DIAGNOSIS — E1151 Type 2 diabetes mellitus with diabetic peripheral angiopathy without gangrene: Secondary | ICD-10-CM | POA: Diagnosis not present

## 2020-03-24 DIAGNOSIS — Z7982 Long term (current) use of aspirin: Secondary | ICD-10-CM | POA: Diagnosis not present

## 2020-03-28 DIAGNOSIS — K7460 Unspecified cirrhosis of liver: Secondary | ICD-10-CM | POA: Diagnosis not present

## 2020-03-28 DIAGNOSIS — I5032 Chronic diastolic (congestive) heart failure: Secondary | ICD-10-CM | POA: Diagnosis not present

## 2020-03-28 DIAGNOSIS — R413 Other amnesia: Secondary | ICD-10-CM | POA: Diagnosis not present

## 2020-03-28 DIAGNOSIS — I110 Hypertensive heart disease with heart failure: Secondary | ICD-10-CM | POA: Diagnosis not present

## 2020-03-28 DIAGNOSIS — E039 Hypothyroidism, unspecified: Secondary | ICD-10-CM | POA: Diagnosis not present

## 2020-03-28 DIAGNOSIS — Z87310 Personal history of (healed) osteoporosis fracture: Secondary | ICD-10-CM | POA: Diagnosis not present

## 2020-03-28 DIAGNOSIS — Z9181 History of falling: Secondary | ICD-10-CM | POA: Diagnosis not present

## 2020-03-28 DIAGNOSIS — E1151 Type 2 diabetes mellitus with diabetic peripheral angiopathy without gangrene: Secondary | ICD-10-CM | POA: Diagnosis not present

## 2020-03-28 DIAGNOSIS — I872 Venous insufficiency (chronic) (peripheral): Secondary | ICD-10-CM | POA: Diagnosis not present

## 2020-03-28 DIAGNOSIS — K8689 Other specified diseases of pancreas: Secondary | ICD-10-CM | POA: Diagnosis not present

## 2020-03-28 DIAGNOSIS — M4800 Spinal stenosis, site unspecified: Secondary | ICD-10-CM | POA: Diagnosis not present

## 2020-03-28 DIAGNOSIS — G2581 Restless legs syndrome: Secondary | ICD-10-CM | POA: Diagnosis not present

## 2020-03-28 DIAGNOSIS — M1991 Primary osteoarthritis, unspecified site: Secondary | ICD-10-CM | POA: Diagnosis not present

## 2020-03-28 DIAGNOSIS — M80052D Age-related osteoporosis with current pathological fracture, left femur, subsequent encounter for fracture with routine healing: Secondary | ICD-10-CM | POA: Diagnosis not present

## 2020-03-28 DIAGNOSIS — Z4789 Encounter for other orthopedic aftercare: Secondary | ICD-10-CM | POA: Diagnosis not present

## 2020-03-28 DIAGNOSIS — Z7982 Long term (current) use of aspirin: Secondary | ICD-10-CM | POA: Diagnosis not present

## 2020-03-28 DIAGNOSIS — Z7951 Long term (current) use of inhaled steroids: Secondary | ICD-10-CM | POA: Diagnosis not present

## 2020-03-28 DIAGNOSIS — J449 Chronic obstructive pulmonary disease, unspecified: Secondary | ICD-10-CM | POA: Diagnosis not present

## 2020-03-28 DIAGNOSIS — E1141 Type 2 diabetes mellitus with diabetic mononeuropathy: Secondary | ICD-10-CM | POA: Diagnosis not present

## 2020-03-29 DIAGNOSIS — S7292XD Unspecified fracture of left femur, subsequent encounter for closed fracture with routine healing: Secondary | ICD-10-CM | POA: Diagnosis not present

## 2020-03-31 DIAGNOSIS — J449 Chronic obstructive pulmonary disease, unspecified: Secondary | ICD-10-CM | POA: Diagnosis not present

## 2020-03-31 DIAGNOSIS — R413 Other amnesia: Secondary | ICD-10-CM | POA: Diagnosis not present

## 2020-03-31 DIAGNOSIS — I5032 Chronic diastolic (congestive) heart failure: Secondary | ICD-10-CM | POA: Diagnosis not present

## 2020-03-31 DIAGNOSIS — M1991 Primary osteoarthritis, unspecified site: Secondary | ICD-10-CM | POA: Diagnosis not present

## 2020-03-31 DIAGNOSIS — Z4789 Encounter for other orthopedic aftercare: Secondary | ICD-10-CM | POA: Diagnosis not present

## 2020-03-31 DIAGNOSIS — E039 Hypothyroidism, unspecified: Secondary | ICD-10-CM | POA: Diagnosis not present

## 2020-03-31 DIAGNOSIS — E1141 Type 2 diabetes mellitus with diabetic mononeuropathy: Secondary | ICD-10-CM | POA: Diagnosis not present

## 2020-03-31 DIAGNOSIS — K8689 Other specified diseases of pancreas: Secondary | ICD-10-CM | POA: Diagnosis not present

## 2020-03-31 DIAGNOSIS — I872 Venous insufficiency (chronic) (peripheral): Secondary | ICD-10-CM | POA: Diagnosis not present

## 2020-03-31 DIAGNOSIS — M4800 Spinal stenosis, site unspecified: Secondary | ICD-10-CM | POA: Diagnosis not present

## 2020-03-31 DIAGNOSIS — M80052D Age-related osteoporosis with current pathological fracture, left femur, subsequent encounter for fracture with routine healing: Secondary | ICD-10-CM | POA: Diagnosis not present

## 2020-03-31 DIAGNOSIS — K7460 Unspecified cirrhosis of liver: Secondary | ICD-10-CM | POA: Diagnosis not present

## 2020-03-31 DIAGNOSIS — Z7982 Long term (current) use of aspirin: Secondary | ICD-10-CM | POA: Diagnosis not present

## 2020-03-31 DIAGNOSIS — Z87310 Personal history of (healed) osteoporosis fracture: Secondary | ICD-10-CM | POA: Diagnosis not present

## 2020-03-31 DIAGNOSIS — Z9181 History of falling: Secondary | ICD-10-CM | POA: Diagnosis not present

## 2020-03-31 DIAGNOSIS — I110 Hypertensive heart disease with heart failure: Secondary | ICD-10-CM | POA: Diagnosis not present

## 2020-03-31 DIAGNOSIS — E1151 Type 2 diabetes mellitus with diabetic peripheral angiopathy without gangrene: Secondary | ICD-10-CM | POA: Diagnosis not present

## 2020-03-31 DIAGNOSIS — G2581 Restless legs syndrome: Secondary | ICD-10-CM | POA: Diagnosis not present

## 2020-03-31 DIAGNOSIS — Z7951 Long term (current) use of inhaled steroids: Secondary | ICD-10-CM | POA: Diagnosis not present

## 2020-04-04 DIAGNOSIS — M1991 Primary osteoarthritis, unspecified site: Secondary | ICD-10-CM | POA: Diagnosis not present

## 2020-04-04 DIAGNOSIS — E039 Hypothyroidism, unspecified: Secondary | ICD-10-CM | POA: Diagnosis not present

## 2020-04-04 DIAGNOSIS — I872 Venous insufficiency (chronic) (peripheral): Secondary | ICD-10-CM | POA: Diagnosis not present

## 2020-04-04 DIAGNOSIS — J449 Chronic obstructive pulmonary disease, unspecified: Secondary | ICD-10-CM | POA: Diagnosis not present

## 2020-04-04 DIAGNOSIS — K7460 Unspecified cirrhosis of liver: Secondary | ICD-10-CM | POA: Diagnosis not present

## 2020-04-04 DIAGNOSIS — I5032 Chronic diastolic (congestive) heart failure: Secondary | ICD-10-CM | POA: Diagnosis not present

## 2020-04-04 DIAGNOSIS — Z7951 Long term (current) use of inhaled steroids: Secondary | ICD-10-CM | POA: Diagnosis not present

## 2020-04-04 DIAGNOSIS — Z4789 Encounter for other orthopedic aftercare: Secondary | ICD-10-CM | POA: Diagnosis not present

## 2020-04-04 DIAGNOSIS — M4800 Spinal stenosis, site unspecified: Secondary | ICD-10-CM | POA: Diagnosis not present

## 2020-04-04 DIAGNOSIS — E1151 Type 2 diabetes mellitus with diabetic peripheral angiopathy without gangrene: Secondary | ICD-10-CM | POA: Diagnosis not present

## 2020-04-04 DIAGNOSIS — Z87310 Personal history of (healed) osteoporosis fracture: Secondary | ICD-10-CM | POA: Diagnosis not present

## 2020-04-04 DIAGNOSIS — K8689 Other specified diseases of pancreas: Secondary | ICD-10-CM | POA: Diagnosis not present

## 2020-04-04 DIAGNOSIS — I110 Hypertensive heart disease with heart failure: Secondary | ICD-10-CM | POA: Diagnosis not present

## 2020-04-04 DIAGNOSIS — M80052D Age-related osteoporosis with current pathological fracture, left femur, subsequent encounter for fracture with routine healing: Secondary | ICD-10-CM | POA: Diagnosis not present

## 2020-04-04 DIAGNOSIS — E1141 Type 2 diabetes mellitus with diabetic mononeuropathy: Secondary | ICD-10-CM | POA: Diagnosis not present

## 2020-04-04 DIAGNOSIS — G2581 Restless legs syndrome: Secondary | ICD-10-CM | POA: Diagnosis not present

## 2020-04-04 DIAGNOSIS — Z7982 Long term (current) use of aspirin: Secondary | ICD-10-CM | POA: Diagnosis not present

## 2020-04-04 DIAGNOSIS — Z9181 History of falling: Secondary | ICD-10-CM | POA: Diagnosis not present

## 2020-04-04 DIAGNOSIS — R413 Other amnesia: Secondary | ICD-10-CM | POA: Diagnosis not present

## 2020-04-05 DIAGNOSIS — M80052D Age-related osteoporosis with current pathological fracture, left femur, subsequent encounter for fracture with routine healing: Secondary | ICD-10-CM | POA: Diagnosis not present

## 2020-04-05 DIAGNOSIS — Z87310 Personal history of (healed) osteoporosis fracture: Secondary | ICD-10-CM | POA: Diagnosis not present

## 2020-04-05 DIAGNOSIS — M4800 Spinal stenosis, site unspecified: Secondary | ICD-10-CM | POA: Diagnosis not present

## 2020-04-05 DIAGNOSIS — E1141 Type 2 diabetes mellitus with diabetic mononeuropathy: Secondary | ICD-10-CM | POA: Diagnosis not present

## 2020-04-05 DIAGNOSIS — R413 Other amnesia: Secondary | ICD-10-CM | POA: Diagnosis not present

## 2020-04-05 DIAGNOSIS — J449 Chronic obstructive pulmonary disease, unspecified: Secondary | ICD-10-CM | POA: Diagnosis not present

## 2020-04-05 DIAGNOSIS — Z7982 Long term (current) use of aspirin: Secondary | ICD-10-CM | POA: Diagnosis not present

## 2020-04-05 DIAGNOSIS — Z9181 History of falling: Secondary | ICD-10-CM | POA: Diagnosis not present

## 2020-04-05 DIAGNOSIS — I110 Hypertensive heart disease with heart failure: Secondary | ICD-10-CM | POA: Diagnosis not present

## 2020-04-05 DIAGNOSIS — K7460 Unspecified cirrhosis of liver: Secondary | ICD-10-CM | POA: Diagnosis not present

## 2020-04-05 DIAGNOSIS — E039 Hypothyroidism, unspecified: Secondary | ICD-10-CM | POA: Diagnosis not present

## 2020-04-05 DIAGNOSIS — Z7951 Long term (current) use of inhaled steroids: Secondary | ICD-10-CM | POA: Diagnosis not present

## 2020-04-05 DIAGNOSIS — M1991 Primary osteoarthritis, unspecified site: Secondary | ICD-10-CM | POA: Diagnosis not present

## 2020-04-05 DIAGNOSIS — I872 Venous insufficiency (chronic) (peripheral): Secondary | ICD-10-CM | POA: Diagnosis not present

## 2020-04-05 DIAGNOSIS — Z4789 Encounter for other orthopedic aftercare: Secondary | ICD-10-CM | POA: Diagnosis not present

## 2020-04-05 DIAGNOSIS — E1151 Type 2 diabetes mellitus with diabetic peripheral angiopathy without gangrene: Secondary | ICD-10-CM | POA: Diagnosis not present

## 2020-04-05 DIAGNOSIS — K8689 Other specified diseases of pancreas: Secondary | ICD-10-CM | POA: Diagnosis not present

## 2020-04-05 DIAGNOSIS — I5032 Chronic diastolic (congestive) heart failure: Secondary | ICD-10-CM | POA: Diagnosis not present

## 2020-04-05 DIAGNOSIS — G2581 Restless legs syndrome: Secondary | ICD-10-CM | POA: Diagnosis not present

## 2020-04-06 DIAGNOSIS — I872 Venous insufficiency (chronic) (peripheral): Secondary | ICD-10-CM | POA: Diagnosis not present

## 2020-04-06 DIAGNOSIS — R413 Other amnesia: Secondary | ICD-10-CM | POA: Diagnosis not present

## 2020-04-06 DIAGNOSIS — E1141 Type 2 diabetes mellitus with diabetic mononeuropathy: Secondary | ICD-10-CM | POA: Diagnosis not present

## 2020-04-06 DIAGNOSIS — Z4789 Encounter for other orthopedic aftercare: Secondary | ICD-10-CM | POA: Diagnosis not present

## 2020-04-06 DIAGNOSIS — M1991 Primary osteoarthritis, unspecified site: Secondary | ICD-10-CM | POA: Diagnosis not present

## 2020-04-06 DIAGNOSIS — Z9181 History of falling: Secondary | ICD-10-CM | POA: Diagnosis not present

## 2020-04-06 DIAGNOSIS — G2581 Restless legs syndrome: Secondary | ICD-10-CM | POA: Diagnosis not present

## 2020-04-06 DIAGNOSIS — M4800 Spinal stenosis, site unspecified: Secondary | ICD-10-CM | POA: Diagnosis not present

## 2020-04-06 DIAGNOSIS — Z87310 Personal history of (healed) osteoporosis fracture: Secondary | ICD-10-CM | POA: Diagnosis not present

## 2020-04-06 DIAGNOSIS — I110 Hypertensive heart disease with heart failure: Secondary | ICD-10-CM | POA: Diagnosis not present

## 2020-04-06 DIAGNOSIS — E039 Hypothyroidism, unspecified: Secondary | ICD-10-CM | POA: Diagnosis not present

## 2020-04-06 DIAGNOSIS — M80052D Age-related osteoporosis with current pathological fracture, left femur, subsequent encounter for fracture with routine healing: Secondary | ICD-10-CM | POA: Diagnosis not present

## 2020-04-06 DIAGNOSIS — I5032 Chronic diastolic (congestive) heart failure: Secondary | ICD-10-CM | POA: Diagnosis not present

## 2020-04-06 DIAGNOSIS — J449 Chronic obstructive pulmonary disease, unspecified: Secondary | ICD-10-CM | POA: Diagnosis not present

## 2020-04-06 DIAGNOSIS — Z7982 Long term (current) use of aspirin: Secondary | ICD-10-CM | POA: Diagnosis not present

## 2020-04-06 DIAGNOSIS — K8689 Other specified diseases of pancreas: Secondary | ICD-10-CM | POA: Diagnosis not present

## 2020-04-06 DIAGNOSIS — Z7951 Long term (current) use of inhaled steroids: Secondary | ICD-10-CM | POA: Diagnosis not present

## 2020-04-06 DIAGNOSIS — K7460 Unspecified cirrhosis of liver: Secondary | ICD-10-CM | POA: Diagnosis not present

## 2020-04-06 DIAGNOSIS — E1151 Type 2 diabetes mellitus with diabetic peripheral angiopathy without gangrene: Secondary | ICD-10-CM | POA: Diagnosis not present

## 2020-04-11 DIAGNOSIS — K7460 Unspecified cirrhosis of liver: Secondary | ICD-10-CM | POA: Diagnosis not present

## 2020-04-11 DIAGNOSIS — Z9181 History of falling: Secondary | ICD-10-CM | POA: Diagnosis not present

## 2020-04-11 DIAGNOSIS — K8689 Other specified diseases of pancreas: Secondary | ICD-10-CM | POA: Diagnosis not present

## 2020-04-11 DIAGNOSIS — G2581 Restless legs syndrome: Secondary | ICD-10-CM | POA: Diagnosis not present

## 2020-04-11 DIAGNOSIS — E1141 Type 2 diabetes mellitus with diabetic mononeuropathy: Secondary | ICD-10-CM | POA: Diagnosis not present

## 2020-04-11 DIAGNOSIS — Z4789 Encounter for other orthopedic aftercare: Secondary | ICD-10-CM | POA: Diagnosis not present

## 2020-04-11 DIAGNOSIS — I872 Venous insufficiency (chronic) (peripheral): Secondary | ICD-10-CM | POA: Diagnosis not present

## 2020-04-11 DIAGNOSIS — M80052D Age-related osteoporosis with current pathological fracture, left femur, subsequent encounter for fracture with routine healing: Secondary | ICD-10-CM | POA: Diagnosis not present

## 2020-04-11 DIAGNOSIS — Z7982 Long term (current) use of aspirin: Secondary | ICD-10-CM | POA: Diagnosis not present

## 2020-04-11 DIAGNOSIS — R413 Other amnesia: Secondary | ICD-10-CM | POA: Diagnosis not present

## 2020-04-11 DIAGNOSIS — M4800 Spinal stenosis, site unspecified: Secondary | ICD-10-CM | POA: Diagnosis not present

## 2020-04-11 DIAGNOSIS — Z7951 Long term (current) use of inhaled steroids: Secondary | ICD-10-CM | POA: Diagnosis not present

## 2020-04-11 DIAGNOSIS — E1151 Type 2 diabetes mellitus with diabetic peripheral angiopathy without gangrene: Secondary | ICD-10-CM | POA: Diagnosis not present

## 2020-04-11 DIAGNOSIS — Z87310 Personal history of (healed) osteoporosis fracture: Secondary | ICD-10-CM | POA: Diagnosis not present

## 2020-04-11 DIAGNOSIS — J449 Chronic obstructive pulmonary disease, unspecified: Secondary | ICD-10-CM | POA: Diagnosis not present

## 2020-04-11 DIAGNOSIS — E039 Hypothyroidism, unspecified: Secondary | ICD-10-CM | POA: Diagnosis not present

## 2020-04-11 DIAGNOSIS — M1991 Primary osteoarthritis, unspecified site: Secondary | ICD-10-CM | POA: Diagnosis not present

## 2020-04-11 DIAGNOSIS — I110 Hypertensive heart disease with heart failure: Secondary | ICD-10-CM | POA: Diagnosis not present

## 2020-04-11 DIAGNOSIS — I5032 Chronic diastolic (congestive) heart failure: Secondary | ICD-10-CM | POA: Diagnosis not present

## 2020-04-12 DIAGNOSIS — M4800 Spinal stenosis, site unspecified: Secondary | ICD-10-CM | POA: Diagnosis not present

## 2020-04-12 DIAGNOSIS — E039 Hypothyroidism, unspecified: Secondary | ICD-10-CM | POA: Diagnosis not present

## 2020-04-12 DIAGNOSIS — Z87310 Personal history of (healed) osteoporosis fracture: Secondary | ICD-10-CM | POA: Diagnosis not present

## 2020-04-12 DIAGNOSIS — I110 Hypertensive heart disease with heart failure: Secondary | ICD-10-CM | POA: Diagnosis not present

## 2020-04-12 DIAGNOSIS — Z4789 Encounter for other orthopedic aftercare: Secondary | ICD-10-CM | POA: Diagnosis not present

## 2020-04-12 DIAGNOSIS — Z7951 Long term (current) use of inhaled steroids: Secondary | ICD-10-CM | POA: Diagnosis not present

## 2020-04-12 DIAGNOSIS — E1141 Type 2 diabetes mellitus with diabetic mononeuropathy: Secondary | ICD-10-CM | POA: Diagnosis not present

## 2020-04-12 DIAGNOSIS — I5032 Chronic diastolic (congestive) heart failure: Secondary | ICD-10-CM | POA: Diagnosis not present

## 2020-04-12 DIAGNOSIS — J449 Chronic obstructive pulmonary disease, unspecified: Secondary | ICD-10-CM | POA: Diagnosis not present

## 2020-04-12 DIAGNOSIS — K7460 Unspecified cirrhosis of liver: Secondary | ICD-10-CM | POA: Diagnosis not present

## 2020-04-12 DIAGNOSIS — E1151 Type 2 diabetes mellitus with diabetic peripheral angiopathy without gangrene: Secondary | ICD-10-CM | POA: Diagnosis not present

## 2020-04-12 DIAGNOSIS — Z7982 Long term (current) use of aspirin: Secondary | ICD-10-CM | POA: Diagnosis not present

## 2020-04-12 DIAGNOSIS — Z9181 History of falling: Secondary | ICD-10-CM | POA: Diagnosis not present

## 2020-04-12 DIAGNOSIS — G2581 Restless legs syndrome: Secondary | ICD-10-CM | POA: Diagnosis not present

## 2020-04-12 DIAGNOSIS — N390 Urinary tract infection, site not specified: Secondary | ICD-10-CM | POA: Diagnosis not present

## 2020-04-12 DIAGNOSIS — M1991 Primary osteoarthritis, unspecified site: Secondary | ICD-10-CM | POA: Diagnosis not present

## 2020-04-12 DIAGNOSIS — M80052D Age-related osteoporosis with current pathological fracture, left femur, subsequent encounter for fracture with routine healing: Secondary | ICD-10-CM | POA: Diagnosis not present

## 2020-04-12 DIAGNOSIS — K8689 Other specified diseases of pancreas: Secondary | ICD-10-CM | POA: Diagnosis not present

## 2020-04-12 DIAGNOSIS — I872 Venous insufficiency (chronic) (peripheral): Secondary | ICD-10-CM | POA: Diagnosis not present

## 2020-04-12 DIAGNOSIS — R413 Other amnesia: Secondary | ICD-10-CM | POA: Diagnosis not present

## 2020-04-13 DIAGNOSIS — Z9181 History of falling: Secondary | ICD-10-CM | POA: Diagnosis not present

## 2020-04-13 DIAGNOSIS — J449 Chronic obstructive pulmonary disease, unspecified: Secondary | ICD-10-CM | POA: Diagnosis not present

## 2020-04-13 DIAGNOSIS — K7460 Unspecified cirrhosis of liver: Secondary | ICD-10-CM | POA: Diagnosis not present

## 2020-04-13 DIAGNOSIS — E039 Hypothyroidism, unspecified: Secondary | ICD-10-CM | POA: Diagnosis not present

## 2020-04-13 DIAGNOSIS — G2581 Restless legs syndrome: Secondary | ICD-10-CM | POA: Diagnosis not present

## 2020-04-13 DIAGNOSIS — Z4789 Encounter for other orthopedic aftercare: Secondary | ICD-10-CM | POA: Diagnosis not present

## 2020-04-13 DIAGNOSIS — R413 Other amnesia: Secondary | ICD-10-CM | POA: Diagnosis not present

## 2020-04-13 DIAGNOSIS — I110 Hypertensive heart disease with heart failure: Secondary | ICD-10-CM | POA: Diagnosis not present

## 2020-04-13 DIAGNOSIS — M80052D Age-related osteoporosis with current pathological fracture, left femur, subsequent encounter for fracture with routine healing: Secondary | ICD-10-CM | POA: Diagnosis not present

## 2020-04-13 DIAGNOSIS — E1151 Type 2 diabetes mellitus with diabetic peripheral angiopathy without gangrene: Secondary | ICD-10-CM | POA: Diagnosis not present

## 2020-04-13 DIAGNOSIS — Z7951 Long term (current) use of inhaled steroids: Secondary | ICD-10-CM | POA: Diagnosis not present

## 2020-04-13 DIAGNOSIS — M4800 Spinal stenosis, site unspecified: Secondary | ICD-10-CM | POA: Diagnosis not present

## 2020-04-13 DIAGNOSIS — M1991 Primary osteoarthritis, unspecified site: Secondary | ICD-10-CM | POA: Diagnosis not present

## 2020-04-13 DIAGNOSIS — Z7982 Long term (current) use of aspirin: Secondary | ICD-10-CM | POA: Diagnosis not present

## 2020-04-13 DIAGNOSIS — I872 Venous insufficiency (chronic) (peripheral): Secondary | ICD-10-CM | POA: Diagnosis not present

## 2020-04-13 DIAGNOSIS — Z87310 Personal history of (healed) osteoporosis fracture: Secondary | ICD-10-CM | POA: Diagnosis not present

## 2020-04-13 DIAGNOSIS — I5032 Chronic diastolic (congestive) heart failure: Secondary | ICD-10-CM | POA: Diagnosis not present

## 2020-04-13 DIAGNOSIS — K8689 Other specified diseases of pancreas: Secondary | ICD-10-CM | POA: Diagnosis not present

## 2020-04-13 DIAGNOSIS — E1141 Type 2 diabetes mellitus with diabetic mononeuropathy: Secondary | ICD-10-CM | POA: Diagnosis not present

## 2020-04-18 DIAGNOSIS — G2581 Restless legs syndrome: Secondary | ICD-10-CM | POA: Diagnosis not present

## 2020-04-18 DIAGNOSIS — M80052D Age-related osteoporosis with current pathological fracture, left femur, subsequent encounter for fracture with routine healing: Secondary | ICD-10-CM | POA: Diagnosis not present

## 2020-04-18 DIAGNOSIS — I5032 Chronic diastolic (congestive) heart failure: Secondary | ICD-10-CM | POA: Diagnosis not present

## 2020-04-18 DIAGNOSIS — I110 Hypertensive heart disease with heart failure: Secondary | ICD-10-CM | POA: Diagnosis not present

## 2020-04-18 DIAGNOSIS — Z87310 Personal history of (healed) osteoporosis fracture: Secondary | ICD-10-CM | POA: Diagnosis not present

## 2020-04-18 DIAGNOSIS — Z7951 Long term (current) use of inhaled steroids: Secondary | ICD-10-CM | POA: Diagnosis not present

## 2020-04-18 DIAGNOSIS — J449 Chronic obstructive pulmonary disease, unspecified: Secondary | ICD-10-CM | POA: Diagnosis not present

## 2020-04-18 DIAGNOSIS — K7460 Unspecified cirrhosis of liver: Secondary | ICD-10-CM | POA: Diagnosis not present

## 2020-04-18 DIAGNOSIS — E1141 Type 2 diabetes mellitus with diabetic mononeuropathy: Secondary | ICD-10-CM | POA: Diagnosis not present

## 2020-04-18 DIAGNOSIS — Z9181 History of falling: Secondary | ICD-10-CM | POA: Diagnosis not present

## 2020-04-18 DIAGNOSIS — M1991 Primary osteoarthritis, unspecified site: Secondary | ICD-10-CM | POA: Diagnosis not present

## 2020-04-18 DIAGNOSIS — K8689 Other specified diseases of pancreas: Secondary | ICD-10-CM | POA: Diagnosis not present

## 2020-04-18 DIAGNOSIS — M4800 Spinal stenosis, site unspecified: Secondary | ICD-10-CM | POA: Diagnosis not present

## 2020-04-18 DIAGNOSIS — E039 Hypothyroidism, unspecified: Secondary | ICD-10-CM | POA: Diagnosis not present

## 2020-04-18 DIAGNOSIS — Z4789 Encounter for other orthopedic aftercare: Secondary | ICD-10-CM | POA: Diagnosis not present

## 2020-04-18 DIAGNOSIS — E1151 Type 2 diabetes mellitus with diabetic peripheral angiopathy without gangrene: Secondary | ICD-10-CM | POA: Diagnosis not present

## 2020-04-18 DIAGNOSIS — Z7982 Long term (current) use of aspirin: Secondary | ICD-10-CM | POA: Diagnosis not present

## 2020-04-18 DIAGNOSIS — R413 Other amnesia: Secondary | ICD-10-CM | POA: Diagnosis not present

## 2020-04-18 DIAGNOSIS — I872 Venous insufficiency (chronic) (peripheral): Secondary | ICD-10-CM | POA: Diagnosis not present

## 2020-04-19 DIAGNOSIS — R600 Localized edema: Secondary | ICD-10-CM | POA: Diagnosis not present

## 2020-04-19 DIAGNOSIS — I739 Peripheral vascular disease, unspecified: Secondary | ICD-10-CM | POA: Diagnosis not present

## 2020-04-19 DIAGNOSIS — Z87440 Personal history of urinary (tract) infections: Secondary | ICD-10-CM | POA: Diagnosis not present

## 2020-04-19 DIAGNOSIS — R413 Other amnesia: Secondary | ICD-10-CM | POA: Diagnosis not present

## 2020-04-19 DIAGNOSIS — I10 Essential (primary) hypertension: Secondary | ICD-10-CM | POA: Diagnosis not present

## 2020-04-20 DIAGNOSIS — Z4789 Encounter for other orthopedic aftercare: Secondary | ICD-10-CM | POA: Diagnosis not present

## 2020-04-20 DIAGNOSIS — E1151 Type 2 diabetes mellitus with diabetic peripheral angiopathy without gangrene: Secondary | ICD-10-CM | POA: Diagnosis not present

## 2020-04-20 DIAGNOSIS — K8689 Other specified diseases of pancreas: Secondary | ICD-10-CM | POA: Diagnosis not present

## 2020-04-20 DIAGNOSIS — G2581 Restless legs syndrome: Secondary | ICD-10-CM | POA: Diagnosis not present

## 2020-04-20 DIAGNOSIS — M4800 Spinal stenosis, site unspecified: Secondary | ICD-10-CM | POA: Diagnosis not present

## 2020-04-20 DIAGNOSIS — R413 Other amnesia: Secondary | ICD-10-CM | POA: Diagnosis not present

## 2020-04-20 DIAGNOSIS — Z7951 Long term (current) use of inhaled steroids: Secondary | ICD-10-CM | POA: Diagnosis not present

## 2020-04-20 DIAGNOSIS — M80052D Age-related osteoporosis with current pathological fracture, left femur, subsequent encounter for fracture with routine healing: Secondary | ICD-10-CM | POA: Diagnosis not present

## 2020-04-20 DIAGNOSIS — M1991 Primary osteoarthritis, unspecified site: Secondary | ICD-10-CM | POA: Diagnosis not present

## 2020-04-20 DIAGNOSIS — Z7982 Long term (current) use of aspirin: Secondary | ICD-10-CM | POA: Diagnosis not present

## 2020-04-20 DIAGNOSIS — Z87310 Personal history of (healed) osteoporosis fracture: Secondary | ICD-10-CM | POA: Diagnosis not present

## 2020-04-20 DIAGNOSIS — J449 Chronic obstructive pulmonary disease, unspecified: Secondary | ICD-10-CM | POA: Diagnosis not present

## 2020-04-20 DIAGNOSIS — I872 Venous insufficiency (chronic) (peripheral): Secondary | ICD-10-CM | POA: Diagnosis not present

## 2020-04-20 DIAGNOSIS — E039 Hypothyroidism, unspecified: Secondary | ICD-10-CM | POA: Diagnosis not present

## 2020-04-20 DIAGNOSIS — I5032 Chronic diastolic (congestive) heart failure: Secondary | ICD-10-CM | POA: Diagnosis not present

## 2020-04-20 DIAGNOSIS — I110 Hypertensive heart disease with heart failure: Secondary | ICD-10-CM | POA: Diagnosis not present

## 2020-04-20 DIAGNOSIS — Z9181 History of falling: Secondary | ICD-10-CM | POA: Diagnosis not present

## 2020-04-20 DIAGNOSIS — E1141 Type 2 diabetes mellitus with diabetic mononeuropathy: Secondary | ICD-10-CM | POA: Diagnosis not present

## 2020-04-20 DIAGNOSIS — K7460 Unspecified cirrhosis of liver: Secondary | ICD-10-CM | POA: Diagnosis not present

## 2020-04-21 DIAGNOSIS — E1141 Type 2 diabetes mellitus with diabetic mononeuropathy: Secondary | ICD-10-CM | POA: Diagnosis not present

## 2020-04-21 DIAGNOSIS — Z9181 History of falling: Secondary | ICD-10-CM | POA: Diagnosis not present

## 2020-04-21 DIAGNOSIS — I110 Hypertensive heart disease with heart failure: Secondary | ICD-10-CM | POA: Diagnosis not present

## 2020-04-21 DIAGNOSIS — J449 Chronic obstructive pulmonary disease, unspecified: Secondary | ICD-10-CM | POA: Diagnosis not present

## 2020-04-21 DIAGNOSIS — M4800 Spinal stenosis, site unspecified: Secondary | ICD-10-CM | POA: Diagnosis not present

## 2020-04-21 DIAGNOSIS — E039 Hypothyroidism, unspecified: Secondary | ICD-10-CM | POA: Diagnosis not present

## 2020-04-21 DIAGNOSIS — M80052D Age-related osteoporosis with current pathological fracture, left femur, subsequent encounter for fracture with routine healing: Secondary | ICD-10-CM | POA: Diagnosis not present

## 2020-04-21 DIAGNOSIS — Z7951 Long term (current) use of inhaled steroids: Secondary | ICD-10-CM | POA: Diagnosis not present

## 2020-04-21 DIAGNOSIS — I5032 Chronic diastolic (congestive) heart failure: Secondary | ICD-10-CM | POA: Diagnosis not present

## 2020-04-21 DIAGNOSIS — Z87310 Personal history of (healed) osteoporosis fracture: Secondary | ICD-10-CM | POA: Diagnosis not present

## 2020-04-21 DIAGNOSIS — K8689 Other specified diseases of pancreas: Secondary | ICD-10-CM | POA: Diagnosis not present

## 2020-04-21 DIAGNOSIS — K7460 Unspecified cirrhosis of liver: Secondary | ICD-10-CM | POA: Diagnosis not present

## 2020-04-21 DIAGNOSIS — Z4789 Encounter for other orthopedic aftercare: Secondary | ICD-10-CM | POA: Diagnosis not present

## 2020-04-21 DIAGNOSIS — G2581 Restless legs syndrome: Secondary | ICD-10-CM | POA: Diagnosis not present

## 2020-04-21 DIAGNOSIS — R413 Other amnesia: Secondary | ICD-10-CM | POA: Diagnosis not present

## 2020-04-21 DIAGNOSIS — I872 Venous insufficiency (chronic) (peripheral): Secondary | ICD-10-CM | POA: Diagnosis not present

## 2020-04-21 DIAGNOSIS — Z7982 Long term (current) use of aspirin: Secondary | ICD-10-CM | POA: Diagnosis not present

## 2020-04-21 DIAGNOSIS — E1151 Type 2 diabetes mellitus with diabetic peripheral angiopathy without gangrene: Secondary | ICD-10-CM | POA: Diagnosis not present

## 2020-04-21 DIAGNOSIS — M1991 Primary osteoarthritis, unspecified site: Secondary | ICD-10-CM | POA: Diagnosis not present

## 2020-04-25 DIAGNOSIS — M1711 Unilateral primary osteoarthritis, right knee: Secondary | ICD-10-CM | POA: Diagnosis not present

## 2020-04-26 DIAGNOSIS — Z87310 Personal history of (healed) osteoporosis fracture: Secondary | ICD-10-CM | POA: Diagnosis not present

## 2020-04-26 DIAGNOSIS — E1141 Type 2 diabetes mellitus with diabetic mononeuropathy: Secondary | ICD-10-CM | POA: Diagnosis not present

## 2020-04-26 DIAGNOSIS — G2581 Restless legs syndrome: Secondary | ICD-10-CM | POA: Diagnosis not present

## 2020-04-26 DIAGNOSIS — Z4789 Encounter for other orthopedic aftercare: Secondary | ICD-10-CM | POA: Diagnosis not present

## 2020-04-26 DIAGNOSIS — K7460 Unspecified cirrhosis of liver: Secondary | ICD-10-CM | POA: Diagnosis not present

## 2020-04-26 DIAGNOSIS — Z7951 Long term (current) use of inhaled steroids: Secondary | ICD-10-CM | POA: Diagnosis not present

## 2020-04-26 DIAGNOSIS — I872 Venous insufficiency (chronic) (peripheral): Secondary | ICD-10-CM | POA: Diagnosis not present

## 2020-04-26 DIAGNOSIS — J449 Chronic obstructive pulmonary disease, unspecified: Secondary | ICD-10-CM | POA: Diagnosis not present

## 2020-04-26 DIAGNOSIS — I5032 Chronic diastolic (congestive) heart failure: Secondary | ICD-10-CM | POA: Diagnosis not present

## 2020-04-26 DIAGNOSIS — Z9181 History of falling: Secondary | ICD-10-CM | POA: Diagnosis not present

## 2020-04-26 DIAGNOSIS — I110 Hypertensive heart disease with heart failure: Secondary | ICD-10-CM | POA: Diagnosis not present

## 2020-04-26 DIAGNOSIS — E1151 Type 2 diabetes mellitus with diabetic peripheral angiopathy without gangrene: Secondary | ICD-10-CM | POA: Diagnosis not present

## 2020-04-26 DIAGNOSIS — M80052D Age-related osteoporosis with current pathological fracture, left femur, subsequent encounter for fracture with routine healing: Secondary | ICD-10-CM | POA: Diagnosis not present

## 2020-04-26 DIAGNOSIS — E039 Hypothyroidism, unspecified: Secondary | ICD-10-CM | POA: Diagnosis not present

## 2020-04-26 DIAGNOSIS — M1991 Primary osteoarthritis, unspecified site: Secondary | ICD-10-CM | POA: Diagnosis not present

## 2020-04-26 DIAGNOSIS — M4800 Spinal stenosis, site unspecified: Secondary | ICD-10-CM | POA: Diagnosis not present

## 2020-04-26 DIAGNOSIS — R413 Other amnesia: Secondary | ICD-10-CM | POA: Diagnosis not present

## 2020-04-26 DIAGNOSIS — K8689 Other specified diseases of pancreas: Secondary | ICD-10-CM | POA: Diagnosis not present

## 2020-04-26 DIAGNOSIS — Z7982 Long term (current) use of aspirin: Secondary | ICD-10-CM | POA: Diagnosis not present

## 2020-04-27 DIAGNOSIS — R413 Other amnesia: Secondary | ICD-10-CM | POA: Diagnosis not present

## 2020-04-27 DIAGNOSIS — M80052D Age-related osteoporosis with current pathological fracture, left femur, subsequent encounter for fracture with routine healing: Secondary | ICD-10-CM | POA: Diagnosis not present

## 2020-04-27 DIAGNOSIS — Z7951 Long term (current) use of inhaled steroids: Secondary | ICD-10-CM | POA: Diagnosis not present

## 2020-04-27 DIAGNOSIS — K8689 Other specified diseases of pancreas: Secondary | ICD-10-CM | POA: Diagnosis not present

## 2020-04-27 DIAGNOSIS — I110 Hypertensive heart disease with heart failure: Secondary | ICD-10-CM | POA: Diagnosis not present

## 2020-04-27 DIAGNOSIS — E1151 Type 2 diabetes mellitus with diabetic peripheral angiopathy without gangrene: Secondary | ICD-10-CM | POA: Diagnosis not present

## 2020-04-27 DIAGNOSIS — Z4789 Encounter for other orthopedic aftercare: Secondary | ICD-10-CM | POA: Diagnosis not present

## 2020-04-27 DIAGNOSIS — G2581 Restless legs syndrome: Secondary | ICD-10-CM | POA: Diagnosis not present

## 2020-04-27 DIAGNOSIS — K7460 Unspecified cirrhosis of liver: Secondary | ICD-10-CM | POA: Diagnosis not present

## 2020-04-27 DIAGNOSIS — J449 Chronic obstructive pulmonary disease, unspecified: Secondary | ICD-10-CM | POA: Diagnosis not present

## 2020-04-27 DIAGNOSIS — Z7982 Long term (current) use of aspirin: Secondary | ICD-10-CM | POA: Diagnosis not present

## 2020-04-27 DIAGNOSIS — M1991 Primary osteoarthritis, unspecified site: Secondary | ICD-10-CM | POA: Diagnosis not present

## 2020-04-27 DIAGNOSIS — E1141 Type 2 diabetes mellitus with diabetic mononeuropathy: Secondary | ICD-10-CM | POA: Diagnosis not present

## 2020-04-27 DIAGNOSIS — I5032 Chronic diastolic (congestive) heart failure: Secondary | ICD-10-CM | POA: Diagnosis not present

## 2020-04-27 DIAGNOSIS — M4800 Spinal stenosis, site unspecified: Secondary | ICD-10-CM | POA: Diagnosis not present

## 2020-04-27 DIAGNOSIS — Z9181 History of falling: Secondary | ICD-10-CM | POA: Diagnosis not present

## 2020-04-27 DIAGNOSIS — I872 Venous insufficiency (chronic) (peripheral): Secondary | ICD-10-CM | POA: Diagnosis not present

## 2020-04-27 DIAGNOSIS — Z87310 Personal history of (healed) osteoporosis fracture: Secondary | ICD-10-CM | POA: Diagnosis not present

## 2020-04-27 DIAGNOSIS — E039 Hypothyroidism, unspecified: Secondary | ICD-10-CM | POA: Diagnosis not present

## 2020-04-29 DIAGNOSIS — S7292XD Unspecified fracture of left femur, subsequent encounter for closed fracture with routine healing: Secondary | ICD-10-CM | POA: Diagnosis not present

## 2020-05-04 DIAGNOSIS — Z7982 Long term (current) use of aspirin: Secondary | ICD-10-CM | POA: Diagnosis not present

## 2020-05-04 DIAGNOSIS — R413 Other amnesia: Secondary | ICD-10-CM | POA: Diagnosis not present

## 2020-05-04 DIAGNOSIS — J449 Chronic obstructive pulmonary disease, unspecified: Secondary | ICD-10-CM | POA: Diagnosis not present

## 2020-05-04 DIAGNOSIS — Z4789 Encounter for other orthopedic aftercare: Secondary | ICD-10-CM | POA: Diagnosis not present

## 2020-05-04 DIAGNOSIS — E1141 Type 2 diabetes mellitus with diabetic mononeuropathy: Secondary | ICD-10-CM | POA: Diagnosis not present

## 2020-05-04 DIAGNOSIS — K8689 Other specified diseases of pancreas: Secondary | ICD-10-CM | POA: Diagnosis not present

## 2020-05-04 DIAGNOSIS — Z87310 Personal history of (healed) osteoporosis fracture: Secondary | ICD-10-CM | POA: Diagnosis not present

## 2020-05-04 DIAGNOSIS — M1991 Primary osteoarthritis, unspecified site: Secondary | ICD-10-CM | POA: Diagnosis not present

## 2020-05-04 DIAGNOSIS — M4800 Spinal stenosis, site unspecified: Secondary | ICD-10-CM | POA: Diagnosis not present

## 2020-05-04 DIAGNOSIS — I5032 Chronic diastolic (congestive) heart failure: Secondary | ICD-10-CM | POA: Diagnosis not present

## 2020-05-04 DIAGNOSIS — Z7951 Long term (current) use of inhaled steroids: Secondary | ICD-10-CM | POA: Diagnosis not present

## 2020-05-04 DIAGNOSIS — E1151 Type 2 diabetes mellitus with diabetic peripheral angiopathy without gangrene: Secondary | ICD-10-CM | POA: Diagnosis not present

## 2020-05-04 DIAGNOSIS — E039 Hypothyroidism, unspecified: Secondary | ICD-10-CM | POA: Diagnosis not present

## 2020-05-04 DIAGNOSIS — I110 Hypertensive heart disease with heart failure: Secondary | ICD-10-CM | POA: Diagnosis not present

## 2020-05-04 DIAGNOSIS — Z9181 History of falling: Secondary | ICD-10-CM | POA: Diagnosis not present

## 2020-05-04 DIAGNOSIS — K7460 Unspecified cirrhosis of liver: Secondary | ICD-10-CM | POA: Diagnosis not present

## 2020-05-04 DIAGNOSIS — I872 Venous insufficiency (chronic) (peripheral): Secondary | ICD-10-CM | POA: Diagnosis not present

## 2020-05-04 DIAGNOSIS — M80052D Age-related osteoporosis with current pathological fracture, left femur, subsequent encounter for fracture with routine healing: Secondary | ICD-10-CM | POA: Diagnosis not present

## 2020-05-04 DIAGNOSIS — G2581 Restless legs syndrome: Secondary | ICD-10-CM | POA: Diagnosis not present

## 2020-05-10 DIAGNOSIS — E1151 Type 2 diabetes mellitus with diabetic peripheral angiopathy without gangrene: Secondary | ICD-10-CM | POA: Diagnosis not present

## 2020-05-10 DIAGNOSIS — I872 Venous insufficiency (chronic) (peripheral): Secondary | ICD-10-CM | POA: Diagnosis not present

## 2020-05-10 DIAGNOSIS — K7460 Unspecified cirrhosis of liver: Secondary | ICD-10-CM | POA: Diagnosis not present

## 2020-05-10 DIAGNOSIS — Z9181 History of falling: Secondary | ICD-10-CM | POA: Diagnosis not present

## 2020-05-10 DIAGNOSIS — Z87310 Personal history of (healed) osteoporosis fracture: Secondary | ICD-10-CM | POA: Diagnosis not present

## 2020-05-10 DIAGNOSIS — E039 Hypothyroidism, unspecified: Secondary | ICD-10-CM | POA: Diagnosis not present

## 2020-05-10 DIAGNOSIS — J449 Chronic obstructive pulmonary disease, unspecified: Secondary | ICD-10-CM | POA: Diagnosis not present

## 2020-05-10 DIAGNOSIS — M1991 Primary osteoarthritis, unspecified site: Secondary | ICD-10-CM | POA: Diagnosis not present

## 2020-05-10 DIAGNOSIS — G2581 Restless legs syndrome: Secondary | ICD-10-CM | POA: Diagnosis not present

## 2020-05-10 DIAGNOSIS — M4800 Spinal stenosis, site unspecified: Secondary | ICD-10-CM | POA: Diagnosis not present

## 2020-05-10 DIAGNOSIS — E1141 Type 2 diabetes mellitus with diabetic mononeuropathy: Secondary | ICD-10-CM | POA: Diagnosis not present

## 2020-05-10 DIAGNOSIS — I5032 Chronic diastolic (congestive) heart failure: Secondary | ICD-10-CM | POA: Diagnosis not present

## 2020-05-10 DIAGNOSIS — Z7982 Long term (current) use of aspirin: Secondary | ICD-10-CM | POA: Diagnosis not present

## 2020-05-10 DIAGNOSIS — Z7951 Long term (current) use of inhaled steroids: Secondary | ICD-10-CM | POA: Diagnosis not present

## 2020-05-10 DIAGNOSIS — Z4789 Encounter for other orthopedic aftercare: Secondary | ICD-10-CM | POA: Diagnosis not present

## 2020-05-10 DIAGNOSIS — R413 Other amnesia: Secondary | ICD-10-CM | POA: Diagnosis not present

## 2020-05-10 DIAGNOSIS — K8689 Other specified diseases of pancreas: Secondary | ICD-10-CM | POA: Diagnosis not present

## 2020-05-10 DIAGNOSIS — I110 Hypertensive heart disease with heart failure: Secondary | ICD-10-CM | POA: Diagnosis not present

## 2020-05-10 DIAGNOSIS — M80052D Age-related osteoporosis with current pathological fracture, left femur, subsequent encounter for fracture with routine healing: Secondary | ICD-10-CM | POA: Diagnosis not present

## 2020-05-15 DIAGNOSIS — Z79899 Other long term (current) drug therapy: Secondary | ICD-10-CM | POA: Diagnosis not present

## 2020-05-18 DIAGNOSIS — K8689 Other specified diseases of pancreas: Secondary | ICD-10-CM | POA: Diagnosis not present

## 2020-05-18 DIAGNOSIS — I110 Hypertensive heart disease with heart failure: Secondary | ICD-10-CM | POA: Diagnosis not present

## 2020-05-18 DIAGNOSIS — K7460 Unspecified cirrhosis of liver: Secondary | ICD-10-CM | POA: Diagnosis not present

## 2020-05-18 DIAGNOSIS — J449 Chronic obstructive pulmonary disease, unspecified: Secondary | ICD-10-CM | POA: Diagnosis not present

## 2020-05-18 DIAGNOSIS — M80052D Age-related osteoporosis with current pathological fracture, left femur, subsequent encounter for fracture with routine healing: Secondary | ICD-10-CM | POA: Diagnosis not present

## 2020-05-18 DIAGNOSIS — I872 Venous insufficiency (chronic) (peripheral): Secondary | ICD-10-CM | POA: Diagnosis not present

## 2020-05-18 DIAGNOSIS — G2581 Restless legs syndrome: Secondary | ICD-10-CM | POA: Diagnosis not present

## 2020-05-18 DIAGNOSIS — Z9181 History of falling: Secondary | ICD-10-CM | POA: Diagnosis not present

## 2020-05-18 DIAGNOSIS — E1141 Type 2 diabetes mellitus with diabetic mononeuropathy: Secondary | ICD-10-CM | POA: Diagnosis not present

## 2020-05-18 DIAGNOSIS — I5032 Chronic diastolic (congestive) heart failure: Secondary | ICD-10-CM | POA: Diagnosis not present

## 2020-05-18 DIAGNOSIS — Z4789 Encounter for other orthopedic aftercare: Secondary | ICD-10-CM | POA: Diagnosis not present

## 2020-05-18 DIAGNOSIS — M1991 Primary osteoarthritis, unspecified site: Secondary | ICD-10-CM | POA: Diagnosis not present

## 2020-05-18 DIAGNOSIS — M4800 Spinal stenosis, site unspecified: Secondary | ICD-10-CM | POA: Diagnosis not present

## 2020-05-18 DIAGNOSIS — R413 Other amnesia: Secondary | ICD-10-CM | POA: Diagnosis not present

## 2020-05-18 DIAGNOSIS — Z7982 Long term (current) use of aspirin: Secondary | ICD-10-CM | POA: Diagnosis not present

## 2020-05-18 DIAGNOSIS — E039 Hypothyroidism, unspecified: Secondary | ICD-10-CM | POA: Diagnosis not present

## 2020-05-18 DIAGNOSIS — E1151 Type 2 diabetes mellitus with diabetic peripheral angiopathy without gangrene: Secondary | ICD-10-CM | POA: Diagnosis not present

## 2020-05-18 DIAGNOSIS — Z7951 Long term (current) use of inhaled steroids: Secondary | ICD-10-CM | POA: Diagnosis not present

## 2020-05-18 DIAGNOSIS — Z87310 Personal history of (healed) osteoporosis fracture: Secondary | ICD-10-CM | POA: Diagnosis not present

## 2020-05-24 DIAGNOSIS — K8689 Other specified diseases of pancreas: Secondary | ICD-10-CM | POA: Diagnosis not present

## 2020-05-24 DIAGNOSIS — I110 Hypertensive heart disease with heart failure: Secondary | ICD-10-CM | POA: Diagnosis not present

## 2020-05-24 DIAGNOSIS — M1991 Primary osteoarthritis, unspecified site: Secondary | ICD-10-CM | POA: Diagnosis not present

## 2020-05-24 DIAGNOSIS — J449 Chronic obstructive pulmonary disease, unspecified: Secondary | ICD-10-CM | POA: Diagnosis not present

## 2020-05-24 DIAGNOSIS — E039 Hypothyroidism, unspecified: Secondary | ICD-10-CM | POA: Diagnosis not present

## 2020-05-24 DIAGNOSIS — Z7951 Long term (current) use of inhaled steroids: Secondary | ICD-10-CM | POA: Diagnosis not present

## 2020-05-24 DIAGNOSIS — I5032 Chronic diastolic (congestive) heart failure: Secondary | ICD-10-CM | POA: Diagnosis not present

## 2020-05-24 DIAGNOSIS — M4800 Spinal stenosis, site unspecified: Secondary | ICD-10-CM | POA: Diagnosis not present

## 2020-05-24 DIAGNOSIS — G2581 Restless legs syndrome: Secondary | ICD-10-CM | POA: Diagnosis not present

## 2020-05-24 DIAGNOSIS — I872 Venous insufficiency (chronic) (peripheral): Secondary | ICD-10-CM | POA: Diagnosis not present

## 2020-05-24 DIAGNOSIS — Z7982 Long term (current) use of aspirin: Secondary | ICD-10-CM | POA: Diagnosis not present

## 2020-05-24 DIAGNOSIS — E1141 Type 2 diabetes mellitus with diabetic mononeuropathy: Secondary | ICD-10-CM | POA: Diagnosis not present

## 2020-05-24 DIAGNOSIS — Z4789 Encounter for other orthopedic aftercare: Secondary | ICD-10-CM | POA: Diagnosis not present

## 2020-05-24 DIAGNOSIS — K7460 Unspecified cirrhosis of liver: Secondary | ICD-10-CM | POA: Diagnosis not present

## 2020-05-24 DIAGNOSIS — Z87310 Personal history of (healed) osteoporosis fracture: Secondary | ICD-10-CM | POA: Diagnosis not present

## 2020-05-24 DIAGNOSIS — E1151 Type 2 diabetes mellitus with diabetic peripheral angiopathy without gangrene: Secondary | ICD-10-CM | POA: Diagnosis not present

## 2020-05-24 DIAGNOSIS — R413 Other amnesia: Secondary | ICD-10-CM | POA: Diagnosis not present

## 2020-05-24 DIAGNOSIS — M80052D Age-related osteoporosis with current pathological fracture, left femur, subsequent encounter for fracture with routine healing: Secondary | ICD-10-CM | POA: Diagnosis not present

## 2020-05-24 DIAGNOSIS — Z9181 History of falling: Secondary | ICD-10-CM | POA: Diagnosis not present

## 2020-05-26 DIAGNOSIS — E1141 Type 2 diabetes mellitus with diabetic mononeuropathy: Secondary | ICD-10-CM | POA: Diagnosis not present

## 2020-05-26 DIAGNOSIS — K8689 Other specified diseases of pancreas: Secondary | ICD-10-CM | POA: Diagnosis not present

## 2020-05-26 DIAGNOSIS — M4800 Spinal stenosis, site unspecified: Secondary | ICD-10-CM | POA: Diagnosis not present

## 2020-05-26 DIAGNOSIS — Z7951 Long term (current) use of inhaled steroids: Secondary | ICD-10-CM | POA: Diagnosis not present

## 2020-05-26 DIAGNOSIS — M80052D Age-related osteoporosis with current pathological fracture, left femur, subsequent encounter for fracture with routine healing: Secondary | ICD-10-CM | POA: Diagnosis not present

## 2020-05-26 DIAGNOSIS — R413 Other amnesia: Secondary | ICD-10-CM | POA: Diagnosis not present

## 2020-05-26 DIAGNOSIS — Z7982 Long term (current) use of aspirin: Secondary | ICD-10-CM | POA: Diagnosis not present

## 2020-05-26 DIAGNOSIS — K7460 Unspecified cirrhosis of liver: Secondary | ICD-10-CM | POA: Diagnosis not present

## 2020-05-26 DIAGNOSIS — Z87310 Personal history of (healed) osteoporosis fracture: Secondary | ICD-10-CM | POA: Diagnosis not present

## 2020-05-26 DIAGNOSIS — I872 Venous insufficiency (chronic) (peripheral): Secondary | ICD-10-CM | POA: Diagnosis not present

## 2020-05-26 DIAGNOSIS — G2581 Restless legs syndrome: Secondary | ICD-10-CM | POA: Diagnosis not present

## 2020-05-26 DIAGNOSIS — Z9181 History of falling: Secondary | ICD-10-CM | POA: Diagnosis not present

## 2020-05-26 DIAGNOSIS — J449 Chronic obstructive pulmonary disease, unspecified: Secondary | ICD-10-CM | POA: Diagnosis not present

## 2020-05-26 DIAGNOSIS — E039 Hypothyroidism, unspecified: Secondary | ICD-10-CM | POA: Diagnosis not present

## 2020-05-26 DIAGNOSIS — Z4789 Encounter for other orthopedic aftercare: Secondary | ICD-10-CM | POA: Diagnosis not present

## 2020-05-26 DIAGNOSIS — I110 Hypertensive heart disease with heart failure: Secondary | ICD-10-CM | POA: Diagnosis not present

## 2020-05-26 DIAGNOSIS — E1151 Type 2 diabetes mellitus with diabetic peripheral angiopathy without gangrene: Secondary | ICD-10-CM | POA: Diagnosis not present

## 2020-05-26 DIAGNOSIS — M1991 Primary osteoarthritis, unspecified site: Secondary | ICD-10-CM | POA: Diagnosis not present

## 2020-05-26 DIAGNOSIS — I5032 Chronic diastolic (congestive) heart failure: Secondary | ICD-10-CM | POA: Diagnosis not present

## 2020-05-27 DIAGNOSIS — S7292XD Unspecified fracture of left femur, subsequent encounter for closed fracture with routine healing: Secondary | ICD-10-CM | POA: Diagnosis not present

## 2020-06-01 DIAGNOSIS — G2581 Restless legs syndrome: Secondary | ICD-10-CM | POA: Diagnosis not present

## 2020-06-01 DIAGNOSIS — M4800 Spinal stenosis, site unspecified: Secondary | ICD-10-CM | POA: Diagnosis not present

## 2020-06-01 DIAGNOSIS — M1991 Primary osteoarthritis, unspecified site: Secondary | ICD-10-CM | POA: Diagnosis not present

## 2020-06-01 DIAGNOSIS — K7460 Unspecified cirrhosis of liver: Secondary | ICD-10-CM | POA: Diagnosis not present

## 2020-06-01 DIAGNOSIS — Z7951 Long term (current) use of inhaled steroids: Secondary | ICD-10-CM | POA: Diagnosis not present

## 2020-06-01 DIAGNOSIS — I5032 Chronic diastolic (congestive) heart failure: Secondary | ICD-10-CM | POA: Diagnosis not present

## 2020-06-01 DIAGNOSIS — Z4789 Encounter for other orthopedic aftercare: Secondary | ICD-10-CM | POA: Diagnosis not present

## 2020-06-01 DIAGNOSIS — J449 Chronic obstructive pulmonary disease, unspecified: Secondary | ICD-10-CM | POA: Diagnosis not present

## 2020-06-01 DIAGNOSIS — Z87310 Personal history of (healed) osteoporosis fracture: Secondary | ICD-10-CM | POA: Diagnosis not present

## 2020-06-01 DIAGNOSIS — M80052D Age-related osteoporosis with current pathological fracture, left femur, subsequent encounter for fracture with routine healing: Secondary | ICD-10-CM | POA: Diagnosis not present

## 2020-06-01 DIAGNOSIS — Z9181 History of falling: Secondary | ICD-10-CM | POA: Diagnosis not present

## 2020-06-01 DIAGNOSIS — E1151 Type 2 diabetes mellitus with diabetic peripheral angiopathy without gangrene: Secondary | ICD-10-CM | POA: Diagnosis not present

## 2020-06-01 DIAGNOSIS — K8689 Other specified diseases of pancreas: Secondary | ICD-10-CM | POA: Diagnosis not present

## 2020-06-01 DIAGNOSIS — R413 Other amnesia: Secondary | ICD-10-CM | POA: Diagnosis not present

## 2020-06-01 DIAGNOSIS — Z7982 Long term (current) use of aspirin: Secondary | ICD-10-CM | POA: Diagnosis not present

## 2020-06-01 DIAGNOSIS — I110 Hypertensive heart disease with heart failure: Secondary | ICD-10-CM | POA: Diagnosis not present

## 2020-06-01 DIAGNOSIS — E1141 Type 2 diabetes mellitus with diabetic mononeuropathy: Secondary | ICD-10-CM | POA: Diagnosis not present

## 2020-06-01 DIAGNOSIS — E039 Hypothyroidism, unspecified: Secondary | ICD-10-CM | POA: Diagnosis not present

## 2020-06-01 DIAGNOSIS — I872 Venous insufficiency (chronic) (peripheral): Secondary | ICD-10-CM | POA: Diagnosis not present

## 2020-06-06 DIAGNOSIS — Z87310 Personal history of (healed) osteoporosis fracture: Secondary | ICD-10-CM | POA: Diagnosis not present

## 2020-06-06 DIAGNOSIS — E1151 Type 2 diabetes mellitus with diabetic peripheral angiopathy without gangrene: Secondary | ICD-10-CM | POA: Diagnosis not present

## 2020-06-06 DIAGNOSIS — E1141 Type 2 diabetes mellitus with diabetic mononeuropathy: Secondary | ICD-10-CM | POA: Diagnosis not present

## 2020-06-06 DIAGNOSIS — Z4789 Encounter for other orthopedic aftercare: Secondary | ICD-10-CM | POA: Diagnosis not present

## 2020-06-06 DIAGNOSIS — I872 Venous insufficiency (chronic) (peripheral): Secondary | ICD-10-CM | POA: Diagnosis not present

## 2020-06-06 DIAGNOSIS — M80052D Age-related osteoporosis with current pathological fracture, left femur, subsequent encounter for fracture with routine healing: Secondary | ICD-10-CM | POA: Diagnosis not present

## 2020-06-06 DIAGNOSIS — E039 Hypothyroidism, unspecified: Secondary | ICD-10-CM | POA: Diagnosis not present

## 2020-06-06 DIAGNOSIS — Z7951 Long term (current) use of inhaled steroids: Secondary | ICD-10-CM | POA: Diagnosis not present

## 2020-06-06 DIAGNOSIS — K7460 Unspecified cirrhosis of liver: Secondary | ICD-10-CM | POA: Diagnosis not present

## 2020-06-06 DIAGNOSIS — Z7982 Long term (current) use of aspirin: Secondary | ICD-10-CM | POA: Diagnosis not present

## 2020-06-06 DIAGNOSIS — K8689 Other specified diseases of pancreas: Secondary | ICD-10-CM | POA: Diagnosis not present

## 2020-06-06 DIAGNOSIS — M4800 Spinal stenosis, site unspecified: Secondary | ICD-10-CM | POA: Diagnosis not present

## 2020-06-06 DIAGNOSIS — G2581 Restless legs syndrome: Secondary | ICD-10-CM | POA: Diagnosis not present

## 2020-06-06 DIAGNOSIS — J449 Chronic obstructive pulmonary disease, unspecified: Secondary | ICD-10-CM | POA: Diagnosis not present

## 2020-06-06 DIAGNOSIS — I5032 Chronic diastolic (congestive) heart failure: Secondary | ICD-10-CM | POA: Diagnosis not present

## 2020-06-06 DIAGNOSIS — Z9181 History of falling: Secondary | ICD-10-CM | POA: Diagnosis not present

## 2020-06-06 DIAGNOSIS — M1991 Primary osteoarthritis, unspecified site: Secondary | ICD-10-CM | POA: Diagnosis not present

## 2020-06-06 DIAGNOSIS — R413 Other amnesia: Secondary | ICD-10-CM | POA: Diagnosis not present

## 2020-06-06 DIAGNOSIS — I110 Hypertensive heart disease with heart failure: Secondary | ICD-10-CM | POA: Diagnosis not present

## 2020-06-13 DIAGNOSIS — M1712 Unilateral primary osteoarthritis, left knee: Secondary | ICD-10-CM | POA: Diagnosis not present

## 2020-06-13 DIAGNOSIS — S7292XD Unspecified fracture of left femur, subsequent encounter for closed fracture with routine healing: Secondary | ICD-10-CM | POA: Diagnosis not present

## 2020-06-13 DIAGNOSIS — M1711 Unilateral primary osteoarthritis, right knee: Secondary | ICD-10-CM | POA: Diagnosis not present

## 2020-06-14 DIAGNOSIS — I10 Essential (primary) hypertension: Secondary | ICD-10-CM | POA: Diagnosis not present

## 2020-06-14 DIAGNOSIS — K7460 Unspecified cirrhosis of liver: Secondary | ICD-10-CM | POA: Diagnosis not present

## 2020-06-14 DIAGNOSIS — M810 Age-related osteoporosis without current pathological fracture: Secondary | ICD-10-CM | POA: Diagnosis not present

## 2020-06-15 DIAGNOSIS — J984 Other disorders of lung: Secondary | ICD-10-CM | POA: Diagnosis not present

## 2020-06-15 DIAGNOSIS — M6281 Muscle weakness (generalized): Secondary | ICD-10-CM | POA: Diagnosis not present

## 2020-06-15 DIAGNOSIS — I252 Old myocardial infarction: Secondary | ICD-10-CM | POA: Diagnosis not present

## 2020-06-15 DIAGNOSIS — I5032 Chronic diastolic (congestive) heart failure: Secondary | ICD-10-CM | POA: Diagnosis not present

## 2020-06-15 DIAGNOSIS — U071 COVID-19: Secondary | ICD-10-CM | POA: Diagnosis not present

## 2020-06-15 DIAGNOSIS — R413 Other amnesia: Secondary | ICD-10-CM | POA: Diagnosis not present

## 2020-06-15 DIAGNOSIS — G2581 Restless legs syndrome: Secondary | ICD-10-CM | POA: Diagnosis not present

## 2020-06-15 DIAGNOSIS — Z882 Allergy status to sulfonamides status: Secondary | ICD-10-CM | POA: Diagnosis not present

## 2020-06-15 DIAGNOSIS — E039 Hypothyroidism, unspecified: Secondary | ICD-10-CM | POA: Diagnosis not present

## 2020-06-15 DIAGNOSIS — M4800 Spinal stenosis, site unspecified: Secondary | ICD-10-CM | POA: Diagnosis not present

## 2020-06-15 DIAGNOSIS — S7292XD Unspecified fracture of left femur, subsequent encounter for closed fracture with routine healing: Secondary | ICD-10-CM | POA: Diagnosis not present

## 2020-06-15 DIAGNOSIS — Z23 Encounter for immunization: Secondary | ICD-10-CM | POA: Diagnosis not present

## 2020-06-15 DIAGNOSIS — M1990 Unspecified osteoarthritis, unspecified site: Secondary | ICD-10-CM | POA: Diagnosis not present

## 2020-06-15 DIAGNOSIS — J449 Chronic obstructive pulmonary disease, unspecified: Secondary | ICD-10-CM | POA: Diagnosis not present

## 2020-06-15 DIAGNOSIS — Z7982 Long term (current) use of aspirin: Secondary | ICD-10-CM | POA: Diagnosis not present

## 2020-06-15 DIAGNOSIS — R531 Weakness: Secondary | ICD-10-CM | POA: Diagnosis not present

## 2020-06-15 DIAGNOSIS — Z4789 Encounter for other orthopedic aftercare: Secondary | ICD-10-CM | POA: Diagnosis not present

## 2020-06-15 DIAGNOSIS — I872 Venous insufficiency (chronic) (peripheral): Secondary | ICD-10-CM | POA: Diagnosis not present

## 2020-06-15 DIAGNOSIS — Z87310 Personal history of (healed) osteoporosis fracture: Secondary | ICD-10-CM | POA: Diagnosis not present

## 2020-06-15 DIAGNOSIS — F32A Depression, unspecified: Secondary | ICD-10-CM | POA: Diagnosis not present

## 2020-06-15 DIAGNOSIS — I110 Hypertensive heart disease with heart failure: Secondary | ICD-10-CM | POA: Diagnosis not present

## 2020-06-15 DIAGNOSIS — E1141 Type 2 diabetes mellitus with diabetic mononeuropathy: Secondary | ICD-10-CM | POA: Diagnosis not present

## 2020-06-15 DIAGNOSIS — J440 Chronic obstructive pulmonary disease with acute lower respiratory infection: Secondary | ICD-10-CM | POA: Diagnosis not present

## 2020-06-15 DIAGNOSIS — Z7951 Long term (current) use of inhaled steroids: Secondary | ICD-10-CM | POA: Diagnosis not present

## 2020-06-15 DIAGNOSIS — E1165 Type 2 diabetes mellitus with hyperglycemia: Secondary | ICD-10-CM | POA: Diagnosis not present

## 2020-06-15 DIAGNOSIS — R1030 Lower abdominal pain, unspecified: Secondary | ICD-10-CM | POA: Diagnosis not present

## 2020-06-15 DIAGNOSIS — Z9181 History of falling: Secondary | ICD-10-CM | POA: Diagnosis not present

## 2020-06-15 DIAGNOSIS — R5383 Other fatigue: Secondary | ICD-10-CM | POA: Diagnosis not present

## 2020-06-15 DIAGNOSIS — D649 Anemia, unspecified: Secondary | ICD-10-CM | POA: Diagnosis not present

## 2020-06-15 DIAGNOSIS — M069 Rheumatoid arthritis, unspecified: Secondary | ICD-10-CM | POA: Diagnosis not present

## 2020-06-15 DIAGNOSIS — D631 Anemia in chronic kidney disease: Secondary | ICD-10-CM | POA: Diagnosis not present

## 2020-06-15 DIAGNOSIS — Z885 Allergy status to narcotic agent status: Secondary | ICD-10-CM | POA: Diagnosis not present

## 2020-06-15 DIAGNOSIS — Z8673 Personal history of transient ischemic attack (TIA), and cerebral infarction without residual deficits: Secondary | ICD-10-CM | POA: Diagnosis not present

## 2020-06-15 DIAGNOSIS — Z79899 Other long term (current) drug therapy: Secondary | ICD-10-CM | POA: Diagnosis not present

## 2020-06-15 DIAGNOSIS — K8689 Other specified diseases of pancreas: Secondary | ICD-10-CM | POA: Diagnosis not present

## 2020-06-15 DIAGNOSIS — Z880 Allergy status to penicillin: Secondary | ICD-10-CM | POA: Diagnosis not present

## 2020-06-15 DIAGNOSIS — R918 Other nonspecific abnormal finding of lung field: Secondary | ICD-10-CM | POA: Diagnosis not present

## 2020-06-15 DIAGNOSIS — R197 Diarrhea, unspecified: Secondary | ICD-10-CM | POA: Diagnosis not present

## 2020-06-15 DIAGNOSIS — J45909 Unspecified asthma, uncomplicated: Secondary | ICD-10-CM | POA: Diagnosis not present

## 2020-06-15 DIAGNOSIS — N183 Chronic kidney disease, stage 3 unspecified: Secondary | ICD-10-CM | POA: Diagnosis not present

## 2020-06-15 DIAGNOSIS — I700 Atherosclerosis of aorta: Secondary | ICD-10-CM | POA: Diagnosis not present

## 2020-06-15 DIAGNOSIS — N390 Urinary tract infection, site not specified: Secondary | ICD-10-CM | POA: Diagnosis not present

## 2020-06-15 DIAGNOSIS — M1991 Primary osteoarthritis, unspecified site: Secondary | ICD-10-CM | POA: Diagnosis not present

## 2020-06-15 DIAGNOSIS — Z743 Need for continuous supervision: Secondary | ICD-10-CM | POA: Diagnosis not present

## 2020-06-15 DIAGNOSIS — I2510 Atherosclerotic heart disease of native coronary artery without angina pectoris: Secondary | ICD-10-CM | POA: Diagnosis not present

## 2020-06-15 DIAGNOSIS — Z7401 Bed confinement status: Secondary | ICD-10-CM | POA: Diagnosis not present

## 2020-06-15 DIAGNOSIS — E119 Type 2 diabetes mellitus without complications: Secondary | ICD-10-CM | POA: Diagnosis not present

## 2020-06-15 DIAGNOSIS — E860 Dehydration: Secondary | ICD-10-CM | POA: Diagnosis not present

## 2020-06-15 DIAGNOSIS — E1122 Type 2 diabetes mellitus with diabetic chronic kidney disease: Secondary | ICD-10-CM | POA: Diagnosis not present

## 2020-06-15 DIAGNOSIS — E1151 Type 2 diabetes mellitus with diabetic peripheral angiopathy without gangrene: Secondary | ICD-10-CM | POA: Diagnosis not present

## 2020-06-15 DIAGNOSIS — I10 Essential (primary) hypertension: Secondary | ICD-10-CM | POA: Diagnosis not present

## 2020-06-15 DIAGNOSIS — M80052D Age-related osteoporosis with current pathological fracture, left femur, subsequent encounter for fracture with routine healing: Secondary | ICD-10-CM | POA: Diagnosis not present

## 2020-06-15 DIAGNOSIS — S3282XA Multiple fractures of pelvis without disruption of pelvic ring, initial encounter for closed fracture: Secondary | ICD-10-CM | POA: Diagnosis not present

## 2020-06-15 DIAGNOSIS — K7460 Unspecified cirrhosis of liver: Secondary | ICD-10-CM | POA: Diagnosis not present

## 2020-06-15 DIAGNOSIS — M109 Gout, unspecified: Secondary | ICD-10-CM | POA: Diagnosis not present

## 2020-06-15 DIAGNOSIS — R5381 Other malaise: Secondary | ICD-10-CM | POA: Diagnosis not present

## 2020-06-30 DIAGNOSIS — Z882 Allergy status to sulfonamides status: Secondary | ICD-10-CM | POA: Diagnosis not present

## 2020-06-30 DIAGNOSIS — Z7982 Long term (current) use of aspirin: Secondary | ICD-10-CM | POA: Diagnosis not present

## 2020-06-30 DIAGNOSIS — D631 Anemia in chronic kidney disease: Secondary | ICD-10-CM | POA: Diagnosis not present

## 2020-06-30 DIAGNOSIS — I10 Essential (primary) hypertension: Secondary | ICD-10-CM | POA: Diagnosis not present

## 2020-06-30 DIAGNOSIS — Z79899 Other long term (current) drug therapy: Secondary | ICD-10-CM | POA: Diagnosis not present

## 2020-06-30 DIAGNOSIS — Z880 Allergy status to penicillin: Secondary | ICD-10-CM | POA: Diagnosis not present

## 2020-06-30 DIAGNOSIS — I25119 Atherosclerotic heart disease of native coronary artery with unspecified angina pectoris: Secondary | ICD-10-CM | POA: Diagnosis not present

## 2020-06-30 DIAGNOSIS — J449 Chronic obstructive pulmonary disease, unspecified: Secondary | ICD-10-CM | POA: Diagnosis not present

## 2020-06-30 DIAGNOSIS — Z23 Encounter for immunization: Secondary | ICD-10-CM | POA: Diagnosis not present

## 2020-06-30 DIAGNOSIS — Z743 Need for continuous supervision: Secondary | ICD-10-CM | POA: Diagnosis not present

## 2020-06-30 DIAGNOSIS — I2510 Atherosclerotic heart disease of native coronary artery without angina pectoris: Secondary | ICD-10-CM | POA: Diagnosis not present

## 2020-06-30 DIAGNOSIS — T148XXA Other injury of unspecified body region, initial encounter: Secondary | ICD-10-CM | POA: Diagnosis not present

## 2020-06-30 DIAGNOSIS — J45909 Unspecified asthma, uncomplicated: Secondary | ICD-10-CM | POA: Diagnosis not present

## 2020-06-30 DIAGNOSIS — E1159 Type 2 diabetes mellitus with other circulatory complications: Secondary | ICD-10-CM | POA: Diagnosis not present

## 2020-06-30 DIAGNOSIS — R5381 Other malaise: Secondary | ICD-10-CM | POA: Diagnosis not present

## 2020-06-30 DIAGNOSIS — N183 Chronic kidney disease, stage 3 unspecified: Secondary | ICD-10-CM | POA: Diagnosis not present

## 2020-06-30 DIAGNOSIS — R531 Weakness: Secondary | ICD-10-CM | POA: Diagnosis not present

## 2020-06-30 DIAGNOSIS — N390 Urinary tract infection, site not specified: Secondary | ICD-10-CM | POA: Diagnosis not present

## 2020-06-30 DIAGNOSIS — E039 Hypothyroidism, unspecified: Secondary | ICD-10-CM | POA: Diagnosis not present

## 2020-06-30 DIAGNOSIS — I129 Hypertensive chronic kidney disease with stage 1 through stage 4 chronic kidney disease, or unspecified chronic kidney disease: Secondary | ICD-10-CM | POA: Diagnosis not present

## 2020-06-30 DIAGNOSIS — J440 Chronic obstructive pulmonary disease with acute lower respiratory infection: Secondary | ICD-10-CM | POA: Diagnosis not present

## 2020-06-30 DIAGNOSIS — I252 Old myocardial infarction: Secondary | ICD-10-CM | POA: Diagnosis not present

## 2020-06-30 DIAGNOSIS — R262 Difficulty in walking, not elsewhere classified: Secondary | ICD-10-CM | POA: Diagnosis not present

## 2020-06-30 DIAGNOSIS — I5032 Chronic diastolic (congestive) heart failure: Secondary | ICD-10-CM | POA: Diagnosis not present

## 2020-06-30 DIAGNOSIS — Z885 Allergy status to narcotic agent status: Secondary | ICD-10-CM | POA: Diagnosis not present

## 2020-06-30 DIAGNOSIS — F32A Depression, unspecified: Secondary | ICD-10-CM | POA: Diagnosis not present

## 2020-06-30 DIAGNOSIS — D692 Other nonthrombocytopenic purpura: Secondary | ICD-10-CM | POA: Diagnosis not present

## 2020-06-30 DIAGNOSIS — D649 Anemia, unspecified: Secondary | ICD-10-CM | POA: Diagnosis not present

## 2020-06-30 DIAGNOSIS — M6281 Muscle weakness (generalized): Secondary | ICD-10-CM | POA: Diagnosis not present

## 2020-06-30 DIAGNOSIS — R918 Other nonspecific abnormal finding of lung field: Secondary | ICD-10-CM | POA: Diagnosis not present

## 2020-06-30 DIAGNOSIS — I152 Hypertension secondary to endocrine disorders: Secondary | ICD-10-CM | POA: Diagnosis not present

## 2020-06-30 DIAGNOSIS — M109 Gout, unspecified: Secondary | ICD-10-CM | POA: Diagnosis not present

## 2020-06-30 DIAGNOSIS — K7460 Unspecified cirrhosis of liver: Secondary | ICD-10-CM | POA: Diagnosis not present

## 2020-06-30 DIAGNOSIS — M069 Rheumatoid arthritis, unspecified: Secondary | ICD-10-CM | POA: Diagnosis not present

## 2020-06-30 DIAGNOSIS — M1990 Unspecified osteoarthritis, unspecified site: Secondary | ICD-10-CM | POA: Diagnosis not present

## 2020-06-30 DIAGNOSIS — E119 Type 2 diabetes mellitus without complications: Secondary | ICD-10-CM | POA: Diagnosis not present

## 2020-06-30 DIAGNOSIS — Z9181 History of falling: Secondary | ICD-10-CM | POA: Diagnosis not present

## 2020-06-30 DIAGNOSIS — E1122 Type 2 diabetes mellitus with diabetic chronic kidney disease: Secondary | ICD-10-CM | POA: Diagnosis not present

## 2020-06-30 DIAGNOSIS — I5030 Unspecified diastolic (congestive) heart failure: Secondary | ICD-10-CM | POA: Diagnosis not present

## 2020-06-30 DIAGNOSIS — S7292XD Unspecified fracture of left femur, subsequent encounter for closed fracture with routine healing: Secondary | ICD-10-CM | POA: Diagnosis not present

## 2020-06-30 DIAGNOSIS — Z8673 Personal history of transient ischemic attack (TIA), and cerebral infarction without residual deficits: Secondary | ICD-10-CM | POA: Diagnosis not present

## 2020-06-30 DIAGNOSIS — U071 COVID-19: Secondary | ICD-10-CM | POA: Diagnosis not present

## 2020-06-30 DIAGNOSIS — Z7401 Bed confinement status: Secondary | ICD-10-CM | POA: Diagnosis not present

## 2020-07-03 DIAGNOSIS — E1159 Type 2 diabetes mellitus with other circulatory complications: Secondary | ICD-10-CM | POA: Diagnosis not present

## 2020-07-03 DIAGNOSIS — R262 Difficulty in walking, not elsewhere classified: Secondary | ICD-10-CM | POA: Diagnosis not present

## 2020-07-03 DIAGNOSIS — I152 Hypertension secondary to endocrine disorders: Secondary | ICD-10-CM | POA: Diagnosis not present

## 2020-07-03 DIAGNOSIS — R918 Other nonspecific abnormal finding of lung field: Secondary | ICD-10-CM | POA: Diagnosis not present

## 2020-07-05 DIAGNOSIS — I152 Hypertension secondary to endocrine disorders: Secondary | ICD-10-CM | POA: Diagnosis not present

## 2020-07-05 DIAGNOSIS — E1159 Type 2 diabetes mellitus with other circulatory complications: Secondary | ICD-10-CM | POA: Diagnosis not present

## 2020-07-11 DIAGNOSIS — I5030 Unspecified diastolic (congestive) heart failure: Secondary | ICD-10-CM | POA: Diagnosis not present

## 2020-07-11 DIAGNOSIS — R918 Other nonspecific abnormal finding of lung field: Secondary | ICD-10-CM | POA: Diagnosis not present

## 2020-07-11 DIAGNOSIS — R262 Difficulty in walking, not elsewhere classified: Secondary | ICD-10-CM | POA: Diagnosis not present

## 2020-07-11 DIAGNOSIS — I25119 Atherosclerotic heart disease of native coronary artery with unspecified angina pectoris: Secondary | ICD-10-CM | POA: Diagnosis not present

## 2020-07-18 DIAGNOSIS — D692 Other nonthrombocytopenic purpura: Secondary | ICD-10-CM | POA: Diagnosis not present

## 2020-07-18 DIAGNOSIS — T148XXA Other injury of unspecified body region, initial encounter: Secondary | ICD-10-CM | POA: Diagnosis not present

## 2020-07-18 DIAGNOSIS — I5030 Unspecified diastolic (congestive) heart failure: Secondary | ICD-10-CM | POA: Diagnosis not present

## 2020-07-27 DIAGNOSIS — I129 Hypertensive chronic kidney disease with stage 1 through stage 4 chronic kidney disease, or unspecified chronic kidney disease: Secondary | ICD-10-CM | POA: Diagnosis not present

## 2020-07-27 DIAGNOSIS — E1122 Type 2 diabetes mellitus with diabetic chronic kidney disease: Secondary | ICD-10-CM | POA: Diagnosis not present

## 2020-07-27 DIAGNOSIS — I5030 Unspecified diastolic (congestive) heart failure: Secondary | ICD-10-CM | POA: Diagnosis not present

## 2020-07-27 DIAGNOSIS — R262 Difficulty in walking, not elsewhere classified: Secondary | ICD-10-CM | POA: Diagnosis not present

## 2020-07-31 ENCOUNTER — Encounter (HOSPITAL_COMMUNITY): Payer: Self-pay

## 2020-07-31 ENCOUNTER — Emergency Department (HOSPITAL_COMMUNITY): Payer: Medicare Other

## 2020-07-31 ENCOUNTER — Inpatient Hospital Stay (HOSPITAL_COMMUNITY): Payer: Medicare Other

## 2020-07-31 ENCOUNTER — Inpatient Hospital Stay (HOSPITAL_COMMUNITY)
Admission: EM | Admit: 2020-07-31 | Discharge: 2020-08-08 | DRG: 070 | Disposition: A | Discharge status: Home Health | Payer: Medicare Other | Attending: Internal Medicine | Admitting: Internal Medicine

## 2020-07-31 ENCOUNTER — Other Ambulatory Visit (HOSPITAL_COMMUNITY): Payer: Medicare Other

## 2020-07-31 ENCOUNTER — Other Ambulatory Visit: Payer: Self-pay

## 2020-07-31 DIAGNOSIS — M1990 Unspecified osteoarthritis, unspecified site: Secondary | ICD-10-CM | POA: Diagnosis present

## 2020-07-31 DIAGNOSIS — R000 Tachycardia, unspecified: Secondary | ICD-10-CM | POA: Diagnosis not present

## 2020-07-31 DIAGNOSIS — D509 Iron deficiency anemia, unspecified: Secondary | ICD-10-CM | POA: Diagnosis present

## 2020-07-31 DIAGNOSIS — E1100 Type 2 diabetes mellitus with hyperosmolarity without nonketotic hyperglycemic-hyperosmolar coma (NKHHC): Secondary | ICD-10-CM | POA: Diagnosis present

## 2020-07-31 DIAGNOSIS — G9341 Metabolic encephalopathy: Secondary | ICD-10-CM | POA: Diagnosis not present

## 2020-07-31 DIAGNOSIS — E46 Unspecified protein-calorie malnutrition: Secondary | ICD-10-CM | POA: Diagnosis present

## 2020-07-31 DIAGNOSIS — I5032 Chronic diastolic (congestive) heart failure: Secondary | ICD-10-CM | POA: Diagnosis not present

## 2020-07-31 DIAGNOSIS — I110 Hypertensive heart disease with heart failure: Secondary | ICD-10-CM | POA: Diagnosis present

## 2020-07-31 DIAGNOSIS — D696 Thrombocytopenia, unspecified: Secondary | ICD-10-CM | POA: Diagnosis not present

## 2020-07-31 DIAGNOSIS — J323 Chronic sphenoidal sinusitis: Secondary | ICD-10-CM | POA: Diagnosis not present

## 2020-07-31 DIAGNOSIS — R404 Transient alteration of awareness: Secondary | ICD-10-CM | POA: Diagnosis not present

## 2020-07-31 DIAGNOSIS — Z7982 Long term (current) use of aspirin: Secondary | ICD-10-CM

## 2020-07-31 DIAGNOSIS — L89610 Pressure ulcer of right heel, unstageable: Secondary | ICD-10-CM | POA: Diagnosis present

## 2020-07-31 DIAGNOSIS — E559 Vitamin D deficiency, unspecified: Secondary | ICD-10-CM | POA: Diagnosis present

## 2020-07-31 DIAGNOSIS — R471 Dysarthria and anarthria: Secondary | ICD-10-CM | POA: Diagnosis not present

## 2020-07-31 DIAGNOSIS — Z7401 Bed confinement status: Secondary | ICD-10-CM

## 2020-07-31 DIAGNOSIS — R29810 Facial weakness: Secondary | ICD-10-CM | POA: Diagnosis present

## 2020-07-31 DIAGNOSIS — N179 Acute kidney failure, unspecified: Secondary | ICD-10-CM | POA: Diagnosis not present

## 2020-07-31 DIAGNOSIS — Z91048 Other nonmedicinal substance allergy status: Secondary | ICD-10-CM

## 2020-07-31 DIAGNOSIS — Z882 Allergy status to sulfonamides status: Secondary | ICD-10-CM

## 2020-07-31 DIAGNOSIS — E1140 Type 2 diabetes mellitus with diabetic neuropathy, unspecified: Secondary | ICD-10-CM | POA: Diagnosis present

## 2020-07-31 DIAGNOSIS — L97419 Non-pressure chronic ulcer of right heel and midfoot with unspecified severity: Secondary | ICD-10-CM | POA: Diagnosis not present

## 2020-07-31 DIAGNOSIS — Z8249 Family history of ischemic heart disease and other diseases of the circulatory system: Secondary | ICD-10-CM

## 2020-07-31 DIAGNOSIS — G3183 Dementia with Lewy bodies: Secondary | ICD-10-CM | POA: Diagnosis present

## 2020-07-31 DIAGNOSIS — R6889 Other general symptoms and signs: Secondary | ICD-10-CM | POA: Diagnosis not present

## 2020-07-31 DIAGNOSIS — I2510 Atherosclerotic heart disease of native coronary artery without angina pectoris: Secondary | ICD-10-CM | POA: Diagnosis present

## 2020-07-31 DIAGNOSIS — N390 Urinary tract infection, site not specified: Secondary | ICD-10-CM | POA: Diagnosis present

## 2020-07-31 DIAGNOSIS — Z66 Do not resuscitate: Secondary | ICD-10-CM | POA: Diagnosis present

## 2020-07-31 DIAGNOSIS — Z885 Allergy status to narcotic agent status: Secondary | ICD-10-CM

## 2020-07-31 DIAGNOSIS — J3489 Other specified disorders of nose and nasal sinuses: Secondary | ICD-10-CM | POA: Diagnosis not present

## 2020-07-31 DIAGNOSIS — Z79891 Long term (current) use of opiate analgesic: Secondary | ICD-10-CM

## 2020-07-31 DIAGNOSIS — R29818 Other symptoms and signs involving the nervous system: Secondary | ICD-10-CM | POA: Diagnosis not present

## 2020-07-31 DIAGNOSIS — Z20822 Contact with and (suspected) exposure to covid-19: Secondary | ICD-10-CM | POA: Diagnosis not present

## 2020-07-31 DIAGNOSIS — L89152 Pressure ulcer of sacral region, stage 2: Secondary | ICD-10-CM | POA: Diagnosis present

## 2020-07-31 DIAGNOSIS — Z79899 Other long term (current) drug therapy: Secondary | ICD-10-CM

## 2020-07-31 DIAGNOSIS — Z888 Allergy status to other drugs, medicaments and biological substances status: Secondary | ICD-10-CM

## 2020-07-31 DIAGNOSIS — I252 Old myocardial infarction: Secondary | ICD-10-CM

## 2020-07-31 DIAGNOSIS — B952 Enterococcus as the cause of diseases classified elsewhere: Secondary | ICD-10-CM | POA: Diagnosis present

## 2020-07-31 DIAGNOSIS — Z9049 Acquired absence of other specified parts of digestive tract: Secondary | ICD-10-CM

## 2020-07-31 DIAGNOSIS — G9340 Encephalopathy, unspecified: Secondary | ICD-10-CM | POA: Diagnosis not present

## 2020-07-31 DIAGNOSIS — E11621 Type 2 diabetes mellitus with foot ulcer: Secondary | ICD-10-CM | POA: Diagnosis not present

## 2020-07-31 DIAGNOSIS — Z743 Need for continuous supervision: Secondary | ICD-10-CM | POA: Diagnosis not present

## 2020-07-31 DIAGNOSIS — I10 Essential (primary) hypertension: Secondary | ICD-10-CM | POA: Diagnosis not present

## 2020-07-31 DIAGNOSIS — L8990 Pressure ulcer of unspecified site, unspecified stage: Secondary | ICD-10-CM | POA: Insufficient documentation

## 2020-07-31 DIAGNOSIS — K219 Gastro-esophageal reflux disease without esophagitis: Secondary | ICD-10-CM | POA: Diagnosis not present

## 2020-07-31 DIAGNOSIS — Z90710 Acquired absence of both cervix and uterus: Secondary | ICD-10-CM

## 2020-07-31 DIAGNOSIS — R52 Pain, unspecified: Secondary | ICD-10-CM

## 2020-07-31 DIAGNOSIS — Z6841 Body Mass Index (BMI) 40.0 and over, adult: Secondary | ICD-10-CM | POA: Diagnosis not present

## 2020-07-31 DIAGNOSIS — Z993 Dependence on wheelchair: Secondary | ICD-10-CM

## 2020-07-31 DIAGNOSIS — M25562 Pain in left knee: Secondary | ICD-10-CM | POA: Diagnosis not present

## 2020-07-31 DIAGNOSIS — E1165 Type 2 diabetes mellitus with hyperglycemia: Secondary | ICD-10-CM | POA: Diagnosis not present

## 2020-07-31 DIAGNOSIS — R739 Hyperglycemia, unspecified: Secondary | ICD-10-CM | POA: Diagnosis not present

## 2020-07-31 DIAGNOSIS — W19XXXA Unspecified fall, initial encounter: Secondary | ICD-10-CM | POA: Diagnosis present

## 2020-07-31 DIAGNOSIS — R079 Chest pain, unspecified: Secondary | ICD-10-CM | POA: Diagnosis not present

## 2020-07-31 DIAGNOSIS — L89151 Pressure ulcer of sacral region, stage 1: Secondary | ICD-10-CM

## 2020-07-31 DIAGNOSIS — L89896 Pressure-induced deep tissue damage of other site: Secondary | ICD-10-CM | POA: Diagnosis present

## 2020-07-31 DIAGNOSIS — Z880 Allergy status to penicillin: Secondary | ICD-10-CM

## 2020-07-31 DIAGNOSIS — D539 Nutritional anemia, unspecified: Secondary | ICD-10-CM | POA: Diagnosis present

## 2020-07-31 DIAGNOSIS — E860 Dehydration: Secondary | ICD-10-CM | POA: Diagnosis present

## 2020-07-31 DIAGNOSIS — E785 Hyperlipidemia, unspecified: Secondary | ICD-10-CM | POA: Diagnosis present

## 2020-07-31 DIAGNOSIS — Z79890 Hormone replacement therapy (postmenopausal): Secondary | ICD-10-CM

## 2020-07-31 DIAGNOSIS — F0280 Dementia in other diseases classified elsewhere without behavioral disturbance: Secondary | ICD-10-CM | POA: Diagnosis present

## 2020-07-31 DIAGNOSIS — L309 Dermatitis, unspecified: Secondary | ICD-10-CM | POA: Diagnosis present

## 2020-07-31 DIAGNOSIS — J0130 Acute sphenoidal sinusitis, unspecified: Secondary | ICD-10-CM | POA: Diagnosis not present

## 2020-07-31 DIAGNOSIS — E039 Hypothyroidism, unspecified: Secondary | ICD-10-CM | POA: Diagnosis present

## 2020-07-31 DIAGNOSIS — F419 Anxiety disorder, unspecified: Secondary | ICD-10-CM | POA: Diagnosis present

## 2020-07-31 DIAGNOSIS — Z8673 Personal history of transient ischemic attack (TIA), and cerebral infarction without residual deficits: Secondary | ICD-10-CM

## 2020-07-31 LAB — CBG MONITORING, ED
Glucose-Capillary: >600 mg/dL (ref 70–99)
Glucose-Capillary: >600 mg/dL (ref 70–99)
Glucose-Capillary: >600 mg/dL (ref 70–99)
Glucose-Capillary: 475 mg/dL — ABNORMAL HIGH (ref 70–99)
Glucose-Capillary: 567 mg/dL (ref 70–99)
Glucose-Capillary: 483 mg/dL — ABNORMAL HIGH (ref 70–99)
Glucose-Capillary: 473 mg/dL — ABNORMAL HIGH (ref 70–99)
Glucose-Capillary: 477 mg/dL — ABNORMAL HIGH (ref 70–99)
Glucose-Capillary: 390 mg/dL — ABNORMAL HIGH (ref 70–99)
Glucose-Capillary: 323 mg/dL — ABNORMAL HIGH (ref 70–99)

## 2020-07-31 LAB — BASIC METABOLIC PANEL
Anion gap: 8 (ref 5–15)
Anion gap: 7 (ref 5–15)
BUN: 22 mg/dL (ref 8–23)
BUN: 23 mg/dL (ref 8–23)
CO2: 26 mmol/L (ref 22–32)
CO2: 28 mmol/L (ref 22–32)
Calcium: 10.2 mg/dL (ref 8.9–10.3)
Calcium: 10.6 mg/dL — ABNORMAL HIGH (ref 8.9–10.3)
Chloride: 102 mmol/L (ref 98–111)
Chloride: 99 mmol/L (ref 98–111)
Creatinine, Ser: 1.38 mg/dL — ABNORMAL HIGH (ref 0.44–1.00)
Creatinine, Ser: 1.32 mg/dL — ABNORMAL HIGH (ref 0.44–1.00)
GFR, Estimated: 39 mL/min — ABNORMAL LOW (ref >60)
GFR, Estimated: 42 mL/min — ABNORMAL LOW (ref >60)
Glucose, Bld: 557 mg/dL (ref 70–99)
Glucose, Bld: 417 mg/dL — ABNORMAL HIGH (ref 70–99)
Potassium: 3.4 mmol/L — ABNORMAL LOW (ref 3.5–5.1)
Potassium: 3.6 mmol/L (ref 3.5–5.1)
Sodium: 136 mmol/L (ref 135–145)
Sodium: 134 mmol/L — ABNORMAL LOW (ref 135–145)

## 2020-07-31 LAB — I-STAT VENOUS BLOOD GAS, ED
Acid-Base Excess: 3 mmol/L — ABNORMAL HIGH (ref 0.0–2.0)
Bicarbonate: 29.1 mmol/L — ABNORMAL HIGH (ref 20.0–28.0)
Calcium, Ion: 1.39 mmol/L (ref 1.15–1.40)
HCT: 32 % — ABNORMAL LOW (ref 36.0–46.0)
Hemoglobin: 10.9 g/dL — ABNORMAL LOW (ref 12.0–15.0)
O2 Saturation: 64 %
Potassium: 4.2 mmol/L (ref 3.5–5.1)
Sodium: 132 mmol/L — ABNORMAL LOW (ref 135–145)
TCO2: 31 mmol/L (ref 22–32)
pCO2, Ven: 50.3 mmHg (ref 44.0–60.0)
pH, Ven: 7.37 (ref 7.250–7.430)
pO2, Ven: 35 mmHg (ref 32.0–45.0)

## 2020-07-31 LAB — COMPREHENSIVE METABOLIC PANEL
ALT: 28 U/L (ref 0–44)
AST: 37 U/L (ref 15–41)
Albumin: 2.3 g/dL — ABNORMAL LOW (ref 3.5–5.0)
Alkaline Phosphatase: 110 U/L (ref 38–126)
Anion gap: 10 (ref 5–15)
BUN: 24 mg/dL — ABNORMAL HIGH (ref 8–23)
CO2: 27 mmol/L (ref 22–32)
Calcium: 10.4 mg/dL — ABNORMAL HIGH (ref 8.9–10.3)
Chloride: 93 mmol/L — ABNORMAL LOW (ref 98–111)
Creatinine, Ser: 1.68 mg/dL — ABNORMAL HIGH (ref 0.44–1.00)
GFR, Estimated: 31 mL/min — ABNORMAL LOW (ref >60)
Glucose, Bld: 908 mg/dL (ref 70–99)
Potassium: 4.5 mmol/L (ref 3.5–5.1)
Sodium: 130 mmol/L — ABNORMAL LOW (ref 135–145)
Total Bilirubin: 1.6 mg/dL — ABNORMAL HIGH (ref 0.3–1.2)
Total Protein: 6.2 g/dL — ABNORMAL LOW (ref 6.5–8.1)

## 2020-07-31 LAB — GLUCOSE, CAPILLARY
Glucose-Capillary: 284 mg/dL — ABNORMAL HIGH (ref 70–99)
Glucose-Capillary: 214 mg/dL — ABNORMAL HIGH (ref 70–99)
Glucose-Capillary: 236 mg/dL — ABNORMAL HIGH (ref 70–99)
Glucose-Capillary: 237 mg/dL — ABNORMAL HIGH (ref 70–99)

## 2020-07-31 LAB — I-STAT CHEM 8, ED
BUN: 28 mg/dL — ABNORMAL HIGH (ref 8–23)
Calcium, Ion: 1.36 mmol/L (ref 1.15–1.40)
Chloride: 94 mmol/L — ABNORMAL LOW (ref 98–111)
Creatinine, Ser: 1.4 mg/dL — ABNORMAL HIGH (ref 0.44–1.00)
Glucose, Bld: >700 mg/dL (ref 70–99)
HCT: 31 % — ABNORMAL LOW (ref 36.0–46.0)
Hemoglobin: 10.5 g/dL — ABNORMAL LOW (ref 12.0–15.0)
Potassium: 4.2 mmol/L (ref 3.5–5.1)
Sodium: 131 mmol/L — ABNORMAL LOW (ref 135–145)
TCO2: 27 mmol/L (ref 22–32)

## 2020-07-31 LAB — SODIUM, URINE, RANDOM: Sodium, Ur: 26 mmol/L

## 2020-07-31 LAB — URINALYSIS, ROUTINE W REFLEX MICROSCOPIC
Bilirubin Urine: NEGATIVE
Glucose, UA: >=500 mg/dL — AB
Ketones, ur: NEGATIVE mg/dL
Nitrite: NEGATIVE
Protein, ur: NEGATIVE mg/dL
Specific Gravity, Urine: 1.02 (ref 1.005–1.030)
pH: 5 (ref 5.0–8.0)

## 2020-07-31 LAB — CBC
HCT: 32.7 % — ABNORMAL LOW (ref 36.0–46.0)
Hemoglobin: 10.7 g/dL — ABNORMAL LOW (ref 12.0–15.0)
MCH: 36.9 pg — ABNORMAL HIGH (ref 26.0–34.0)
MCHC: 32.7 g/dL (ref 30.0–36.0)
MCV: 112.8 fL — ABNORMAL HIGH (ref 80.0–100.0)
Platelets: 137 10*3/uL — ABNORMAL LOW (ref 150–400)
RBC: 2.9 MIL/uL — ABNORMAL LOW (ref 3.87–5.11)
RDW: 14.7 % (ref 11.5–15.5)
WBC: 8.2 10*3/uL (ref 4.0–10.5)
nRBC: 0 % (ref 0.0–0.2)

## 2020-07-31 LAB — DIFFERENTIAL
Abs Immature Granulocytes: 0.05 10*3/uL (ref 0.00–0.07)
Basophils Absolute: 0 10*3/uL (ref 0.0–0.1)
Basophils Relative: 0 %
Eosinophils Absolute: 0.1 10*3/uL (ref 0.0–0.5)
Eosinophils Relative: 2 %
Immature Granulocytes: 1 %
Lymphocytes Relative: 10 %
Lymphs Abs: 0.8 10*3/uL (ref 0.7–4.0)
Monocytes Absolute: 0.6 10*3/uL (ref 0.1–1.0)
Monocytes Relative: 7 %
Neutro Abs: 6.5 10*3/uL (ref 1.7–7.7)
Neutrophils Relative %: 80 %

## 2020-07-31 LAB — VITAMIN B12: Vitamin B-12: 848 pg/mL (ref 180–914)

## 2020-07-31 LAB — TSH: TSH: 0.289 u[IU]/mL — ABNORMAL LOW (ref 0.350–4.500)

## 2020-07-31 LAB — FOLATE: Folate: 9.4 ng/mL (ref >5.9)

## 2020-07-31 LAB — OSMOLALITY: Osmolality: 319 mOsm/kg — ABNORMAL HIGH (ref 275–295)

## 2020-07-31 LAB — CREATININE, URINE, RANDOM: Creatinine, Urine: 27.02 mg/dL

## 2020-07-31 LAB — APTT: aPTT: 25 seconds (ref 24–36)

## 2020-07-31 LAB — SARS CORONAVIRUS 2 (TAT 6-24 HRS): SARS Coronavirus 2: NEGATIVE

## 2020-07-31 LAB — PROTIME-INR
INR: 1.2 (ref 0.8–1.2)
Prothrombin Time: 14.9 seconds (ref 11.4–15.2)

## 2020-07-31 LAB — BETA-HYDROXYBUTYRIC ACID: Beta-Hydroxybutyric Acid: 0.19 mmol/L (ref 0.05–0.27)

## 2020-07-31 MED ORDER — SODIUM CHLORIDE 0.9 % IV BOLUS FOR AMBISOME: 500.0000 mL | INTRAVENOUS | Status: DC

## 2020-07-31 MED ORDER — SODIUM CHLORIDE 0.9% FLUSH
3.0000 mL | Freq: Once | INTRAVENOUS | Status: AC
Start: 2020-07-31 — End: 2020-07-31
  Administered 2020-07-31: 3 mL via INTRAVENOUS

## 2020-07-31 MED ORDER — HEPARIN SODIUM (PORCINE) 5000 UNIT/ML IJ SOLN
5000.0000 [IU] | Freq: Three times a day (TID) | INTRAMUSCULAR | Status: DC
  Administered 2020-07-31 – 2020-08-08 (×26): 5000 [IU] via SUBCUTANEOUS
  Filled 2020-07-31 (×25): qty 1

## 2020-07-31 MED ORDER — LACTATED RINGERS IV SOLN: INTRAVENOUS | Status: DC

## 2020-07-31 MED ORDER — ASPIRIN EC 81 MG PO TBEC
81.0000 mg | DELAYED_RELEASE_TABLET | Freq: Every day | ORAL | Status: DC
  Administered 2020-08-01 – 2020-08-08 (×8): 81 mg via ORAL
  Filled 2020-07-31 (×8): qty 1

## 2020-07-31 MED ORDER — POLYETHYLENE GLYCOL 3350 17 G PO PACK: 17.0000 g | PACK | Freq: Every day | ORAL | Status: DC | PRN

## 2020-07-31 MED ORDER — DEXTROSE IN LACTATED RINGERS 5 % IV SOLN: INTRAVENOUS | Status: DC

## 2020-07-31 MED ORDER — ASPIRIN EC 81 MG PO TBEC: 81.0000 mg | DELAYED_RELEASE_TABLET | Freq: Every day | ORAL | Status: DC

## 2020-07-31 MED ORDER — DEXTROSE 5% FOR FLUSHING BEFORE AND AFTER AMBISOME
10.0000 mL | INTRAVENOUS | Status: DC
  Filled 2020-07-31: qty 50

## 2020-07-31 MED ORDER — LEVOTHYROXINE SODIUM 100 MCG PO TABS
100.0000 ug | ORAL_TABLET | Freq: Every day | ORAL | Status: DC
  Administered 2020-08-01 – 2020-08-08 (×8): 100 ug via ORAL
  Filled 2020-07-31 (×8): qty 1

## 2020-07-31 MED ORDER — ENOXAPARIN SODIUM 40 MG/0.4ML IJ SOSY: 40.0000 mg | PREFILLED_SYRINGE | INTRAMUSCULAR | Status: DC

## 2020-07-31 MED ORDER — ACETAMINOPHEN 650 MG RE SUPP: 650.0000 mg | Freq: Four times a day (QID) | RECTAL | Status: DC | PRN

## 2020-07-31 MED ORDER — SODIUM CHLORIDE 0.9 % IV SOLN
1.0000 g | INTRAVENOUS | Status: DC
  Administered 2020-08-01: 1 g via INTRAVENOUS
  Filled 2020-07-31: qty 10

## 2020-07-31 MED ORDER — DEXTROSE 5 % IV SOLN
5.0000 mg/kg | INTRAVENOUS | Status: DC
  Filled 2020-07-31: qty 87.5

## 2020-07-31 MED ORDER — INSULIN REGULAR(HUMAN) IN NACL 100-0.9 UT/100ML-% IV SOLN
INTRAVENOUS | Status: DC
  Administered 2020-07-31: 9 [IU]/h via INTRAVENOUS
  Administered 2020-07-31: 8.5 [IU]/h via INTRAVENOUS
  Filled 2020-07-31 (×3): qty 100

## 2020-07-31 MED ORDER — DEXTROSE 50 % IV SOLN
0.0000 mL | INTRAVENOUS | Status: DC | PRN
Start: 2020-07-31 — End: 2020-08-09

## 2020-07-31 MED ORDER — POTASSIUM CHLORIDE 10 MEQ/100ML IV SOLN
10.0000 meq | INTRAVENOUS | Status: AC
  Administered 2020-07-31 (×2): 10 meq via INTRAVENOUS
  Filled 2020-07-31 (×2): qty 100

## 2020-07-31 MED ORDER — DIPHENHYDRAMINE HCL 50 MG/ML IJ SOLN: 25.0000 mg | Freq: Every day | INTRAMUSCULAR | Status: DC | PRN

## 2020-07-31 MED ORDER — IPRATROPIUM BROMIDE 0.02 % IN SOLN
2.5000 mL | Freq: Four times a day (QID) | RESPIRATORY_TRACT | Status: DC
  Administered 2020-07-31: 0.5 mg via RESPIRATORY_TRACT
  Filled 2020-07-31: qty 2.5

## 2020-07-31 MED ORDER — PROPRANOLOL HCL 10 MG PO TABS
10.0000 mg | ORAL_TABLET | Freq: Two times a day (BID) | ORAL | Status: DC
  Administered 2020-07-31 – 2020-08-03 (×6): 10 mg via ORAL
  Filled 2020-07-31 (×8): qty 1

## 2020-07-31 MED ORDER — DIPHENHYDRAMINE HCL 25 MG PO CAPS: 25.0000 mg | ORAL_CAPSULE | Freq: Every day | ORAL | Status: DC | PRN

## 2020-07-31 MED ORDER — LACTATED RINGERS IV BOLUS
1000.0000 mL | Freq: Once | INTRAVENOUS | Status: AC
  Administered 2020-07-31: 1000 mL via INTRAVENOUS

## 2020-07-31 MED ORDER — SODIUM CHLORIDE 0.9 % IV SOLN
1.0000 g | Freq: Once | INTRAVENOUS | Status: AC
  Administered 2020-07-31: 1 g via INTRAVENOUS
  Filled 2020-07-31: qty 10

## 2020-07-31 MED ORDER — ALBUTEROL SULFATE (2.5 MG/3ML) 0.083% IN NEBU: 2.5000 mg | INHALATION_SOLUTION | Freq: Four times a day (QID) | RESPIRATORY_TRACT | Status: DC | PRN

## 2020-07-31 MED ORDER — MEPERIDINE HCL 25 MG/ML IJ SOLN: 25.0000 mg | INTRAMUSCULAR | Status: DC | PRN

## 2020-07-31 MED ORDER — ACETAMINOPHEN 325 MG PO TABS: 650.0000 mg | ORAL_TABLET | Freq: Every day | ORAL | Status: DC | PRN

## 2020-07-31 MED ORDER — ACETAMINOPHEN 325 MG PO TABS: 650.0000 mg | ORAL_TABLET | Freq: Four times a day (QID) | ORAL | Status: DC | PRN

## 2020-07-31 NOTE — ED Notes (Signed)
Attempted to call report x2

## 2020-07-31 NOTE — Code Documentation (Signed)
Stroke Response Nurse Documentation Code Documentation  Casey Humphrey is a 75 y.o. female arriving to [place]. [facility] ED via [facility] on 07/31/2020 with past medical hx of Diabetes, CHF, HTN, Stroke, MI, Hyperthyroidism. Code stroke was activated by EMS after patient was noted to have altered mental status, right facial droop, and some sensory decreased on the right. Family reports patient returned home from nursing home on Friday. Daturday night she had a fall and the patient has reported seeing her dead relatives since then. Last Night, the patient was at her baseline when she went to bed at 2100.   Stroke team at the bedside on patient arrival. Labs drawn and patient cleared for CT by Dr. Regenia Skeeter. Upon Arrival, pt's CBG read HIGH and patient reports no longer being on her diabetes medicine per PCP. Patient to CT with team. NIHSS 5, see documentation for details and code stroke times. Patient with bilateral leg weakness, right decreased sensation and dysarthria  on exam. The following imaging was completed:  CT. Patient is not a candidate for tPA due to being outside window for tPA. Care/Plan: q 2 mNIHSS/VS, complete MRI. Bedside handoff with ED RN [place].    Kathrin Greathouse  Stroke Response RN

## 2020-07-31 NOTE — Progress Notes (Signed)
Pharmacy Antibiotic Note  Casey Humphrey is a 75 y.o. female admitted on 07/31/2020 presenting with AMS and as code stroke, MRI suggestive for fungal sinusitis.  Pharmacy has been consulted for ambisome dosing.  Plan: Ambisome 5mg /kg (350mg ) IV daily F/u ENT recs, biopsy, ID workup and ability to narrow/switch to PO Monitor renal function, tolerability, BMP/Mg in AM  Height: 5\' 2"  (157.5 cm) Weight: 70.4 kg (155 lb 3.3 oz) IBW/kg (Calculated) : 50.1  Temp (24hrs), Avg:97.7 F (36.5 C), Min:97.7 F (36.5 C), Max:97.7 F (36.5 C)  Recent Labs  Lab 07/31/20 0915 07/31/20 0929  WBC 8.2  --   CREATININE 1.68* 1.40*    Estimated Creatinine Clearance: 30.9 mL/min (A) (by C-G formula based on SCr of 1.4 mg/dL (H)).    Allergies  Allergen Reactions  . Codeine Nausea And Vomiting  . Quinine Derivatives Nausea And Vomiting  . Sulfa Antibiotics Nausea And Vomiting and Other (See Comments)    "gets real sick" per family  . Adhesive [Tape] Rash  . Penicillins Rash  . Procardia [Nifedipine] Rash    Bertis Ruddy, PharmD Clinical Pharmacist ED Pharmacist Phone # [phone] 07/31/2020 4:08 PM

## 2020-07-31 NOTE — Evaluation (Signed)
Clinical/Bedside Swallow Evaluation Patient Details  Name: Casey Humphrey MRN: [id number] Date of Birth: November 02, 1945  Today's Date: 07/31/2020 Time: SLP Start Time (ACUTE ONLY): 1555 SLP Stop Time (ACUTE ONLY): 1615 SLP Time Calculation (min) (ACUTE ONLY): 20 min  Past Medical History:  Past Medical History:  Diagnosis Date  . Anginal pain ([place])    takes nitro approx once per month  . Anxiety   . Arthritis   . Asthma    occ. Bronchial problems  . Bleeder's disease    "free Bleeder"  . CHF (congestive heart failure) ([place])   . Coronary artery disease   . Diabetes mellitus without complication ([place])   . Family history of adverse reaction to anesthesia    sister has n/v  . Frequency of urination   . GERD (gastroesophageal reflux disease)   . History of kidney stones   . Hypertension   . Hyperthyroidism   . Meniscus tear    left knee  . Myocardial infarction [facility]) 1980's   " it was a mild one"  . Neuromuscular disorder ([facility])    diabetic neuropathy feet  . Nocturia   . Shortness of breath dyspnea    with exertion  . Stroke [facility])    left leg affected'14-no problems now   Past Surgical History:  Past Surgical History:  Procedure Laterality Date  . ABDOMINAL HYSTERECTOMY    . CARDIAC CATHETERIZATION     8'14-[facility]  . CATARACT EXTRACTION, BILATERAL Bilateral   . CHOLECYSTECTOMY     open  . CHONDROPLASTY Left 04/27/2014   Procedure: abrasion CHONDROPLASTY of medial femoral condyle, abrasion chondroplasty of patella;  Surgeon: Tobi Bastos, MD;  Location: [facility] ORS;  Service: Orthopedics;  Laterality: Left;  . COLONOSCOPY     polyp removed  . EUS N/A 04/08/2013   Procedure: UPPER ENDOSCOPIC ULTRASOUND (EUS) LINEAR;  Surgeon: Milus Banister, MD;  Location: [facility] END[facility]COPY;  Service: Endoscopy;  Laterality: N/A;  . KNEE ARTHROSCOPY WITH LATERAL MENISECTOMY Left 04/27/2014   Procedure: LEFT KNEE ARTHROSCOPY WITH  medial and LATERAL MENISECTOMY.;  Surgeon:  Tobi Bastos, MD;  Location: [facility];  Service: Orthopedics;  Laterality: Left;   HPI:  Patient is a 75 y.o. female with PMH: DM-2, HTN, vitamin-D deficiency, chronic diastolic heart failure, CAD, prior CVA with left LE deficit, GERD, osteoarthritis, hypothyroidism who presented to ED with AMS which include episodes of hallucinations. CT  head did not show any intracranial abnormalities, CXR did not reveal any pulmonary infiltrates. Urinalysis showed pyuria with many bacteria, glucose greater than 500.  MRI of brain showed complete opacification of an expanded right sphenoid sinus with findings suggestive of fungal sinusitis.   Assessment / Plan / Recommendation Clinical Impression  Patient presents with a mild oropharyngeal dysphagia and suspect esophageal component as well secondary to patient's h/o GERD and mild instances of belching during this evaluation. She exhibited one instance of delayed cough during PO intake of thin liquids via straw sips, however voice remained clear and no other instances of coughing or throat clearing occured. She exhibited mild delay of oral phase with mastication of saltine cracker and with trace oral residuals that did clear with liquid sips. Patient reported h/o difficulty with some harder/tougher foods such as meats (steak, etc). SLP is recommending to start patient on Dys 3 solids, thin liquids and briefly follow for toleration. SLP Visit Diagnosis: Dysphagia, unspecified (R13.10)    Aspiration Risk  Mild aspiration risk    Diet Recommendation Dysphagia  3 (Mech soft);Thin liquid   Liquid Administration via: Cup;Straw Medication Administration: Whole meds with liquid Supervision: Patient able to self feed;Comment (intermittent supervision for swallow safety but may need more assistance with self-feeding) Compensations: Slow rate;Small sips/bites;Minimize environmental distractions Postural Changes: Seated upright at 90 degrees    Other  Recommendations  Oral Care Recommendations: Oral care BID;Staff/trained caregiver to provide oral care   Follow up Recommendations None      Frequency and Duration min 1 x/week  1 week       Prognosis Prognosis for Safe Diet Advancement: Good      Swallow Study   General Date of Onset: 07/31/20 HPI: Patient is a 75 y.o. female with PMH: DM-2, HTN, vitamin-D deficiency, chronic diastolic heart failure, CAD, prior CVA with left LE deficit, GERD, osteoarthritis, hypothyroidism who presented to ED with AMS which include episodes of hallucinations. CT  head did not show any intracranial abnormalities, CXR did not reveal any pulmonary infiltrates. Urinalysis showed pyuria with many bacteria, glucose greater than 500.  MRI of brain showed complete opacification of an expanded right sphenoid sinus with findings suggestive of fungal sinusitis. Type of Study: Bedside Swallow Evaluation Previous Swallow Assessment: none found Diet Prior to this Study: NPO Temperature Spikes Noted: No History of Recent Intubation: No Behavior/Cognition: Alert;Cooperative;Pleasant mood Oral Cavity Assessment: Within Functional Limits Oral Care Completed by SLP: No Oral Cavity - Dentition: Dentures, top;Edentulous;Other (Comment) (does not have lower dentures, uppers are in place) Vision: Functional for self-feeding Self-Feeding Abilities: Able to feed self;Needs set up Patient Positioning: Upright in bed Baseline Vocal Quality: Normal Volitional Cough: Weak Volitional Swallow: Able to elicit    Oral/Motor/Sensory Function Overall Oral Motor/Sensory Function: Within functional limits   Ice Chips     Thin Liquid Thin Liquid: Impaired Presentation: Straw Pharyngeal  Phase Impairments: Cough - Delayed Other Comments: one instance of delayed cough observed    Nectar Thick     Honey Thick     Puree Puree: Not tested   Solid     Solid: Impaired Oral Phase Impairments: Impaired mastication Oral Phase Functional  Implications: Oral residue     Sonia Baller, MA, CCC-SLP Speech Therapy

## 2020-07-31 NOTE — ED Provider Notes (Signed)
[facility] EMERGENCY DEPARTMENT Provider Note   CSN: [id number] Arrival date & time: 07/31/20  0913  LEVEL 5 CAVEAT - ALTERED MENTAL STATUS  History Chief Complaint  Patient presents with  . Code Stroke    Casey Humphrey is a 75 y.o. female.  HPI 75 year old female brought in as a code stroke.  History is primarily from EMS as the patient does altered.  Reportedly last normal last night around 9 PM.  The patient got out of a nursing home 3 days ago.  She fell 2 days ago but did not seek medical care.  Has been having some hallucinations since the fall.  However altered since last night around 9 PM along with some right-sided facial droop.  Has chronic left-sided weakness.  Glucose was reading high for EMS.  Past Medical History:  Diagnosis Date  . Anginal pain ([place])    takes nitro approx once per month  . Anxiety   . Arthritis   . Asthma    occ. Bronchial problems  . Bleeder's disease    "free Bleeder"  . CHF (congestive heart failure) ([place])   . Coronary artery disease   . Diabetes mellitus without complication ([place])   . Family history of adverse reaction to anesthesia    sister has n/v  . Frequency of urination   . GERD (gastroesophageal reflux disease)   . History of kidney stones   . Hypertension   . Hyperthyroidism   . Meniscus tear    left knee  . Myocardial infarction [facility]) 1980's   " it was a mild one"  . Neuromuscular disorder (HCC)    diabetic neuropathy feet  . Nocturia   . Shortness of breath dyspnea    with exertion  . Stroke [facility])    left leg affected'14-no problems now    Patient Active Problem List   Diagnosis Date Noted  . Acute metabolic and infectious encephalopathy 07/31/2020  . Acute symptomatic hyperglycemia 07/31/2020  . UTI (urinary tract infection) 07/31/2020  . Asthma 01/28/2018  . Cold extremities 11/04/2017  . Hypertensive heart disease with heart failure ([place]) 10/14/2017  . Mild CAD 10/14/2017  .  Type 2 diabetes mellitus ([place]) 10/14/2017  . Chest pain 10/14/2017  . Chronic diastolic heart failure ([place]) 07/25/2015  . Hyperlipidemia 07/25/2015  . Pancreatic mass 04/08/2013    Past Surgical History:  Procedure Laterality Date  . ABDOMINAL HYSTERECTOMY    . CARDIAC CATHETERIZATION     8'14-[facility]  . CATARACT EXTRACTION, BILATERAL Bilateral   . CHOLECYSTECTOMY     open  . CHONDROPLASTY Left 04/27/2014   Procedure: abrasion CHONDROPLASTY of medial femoral condyle, abrasion chondroplasty of patella;  Surgeon: Tobi Bastos, MD;  Location: [facility] ORS;  Service: Orthopedics;  Laterality: Left;  . COLONOSCOPY     polyp removed  . EUS N/A 04/08/2013   Procedure: UPPER ENDOSCOPIC ULTRASOUND (EUS) LINEAR;  Surgeon: Milus Banister, MD;  Location: [facility] ENDOSCOPY;  Service: Endoscopy;  Laterality: N/A;  . KNEE ARTHROSCOPY WITH LATERAL MENISECTOMY Left 04/27/2014   Procedure: LEFT KNEE ARTHROSCOPY WITH  medial and LATERAL MENISECTOMY.;  Surgeon: Tobi Bastos, MD;  Location: [facility] ORS;  Service: Orthopedics;  Laterality: Left;     OB History   No obstetric history on file.     Family History  Problem Relation Age of Onset  . CAD Sister     Social History   Tobacco Use  . Smoking status: Never Smoker  .  Smokeless tobacco: Never Used  Vaping Use  . Vaping Use: Never used  Substance Use Topics  . Alcohol use: No  . Drug use: No    Home Medications Prior to Admission medications   Medication Sig Start Date End Date Taking? Authorizing Provider  albuterol (PROVENTIL HFA;VENTOLIN HFA) 108 (90 Base) MCG/ACT inhaler Inhale 2 puffs into the lungs every 6 (six) hours as needed for wheezing or shortness of breath. 01/28/18  Yes Richardo Priest, MD  aspirin EC 81 MG tablet Take 81 mg by mouth daily.   Yes [provider]  diclofenac Sodium (VOLTAREN) 1 % GEL Apply 2 g topically 2 (two) times daily. 06/30/20  Yes [provider]  FIBER-LAX 625 MG tablet Take 1  tablet by mouth daily. 07/05/20  Yes [provider]  fluticasone (FLONASE) 50 MCG/ACT nasal spray Place 2 sprays into both nostrils daily. 05/14/19  Yes [provider]  furosemide (LASIX) 40 MG tablet Take 1 tablet (40 mg total) by mouth daily as needed. Patient taking differently: Take 40 mg by mouth daily. 08/12/19 12/10/19 Yes Richardo Priest, MD  Infant Care Products [facility]) OINT Apply 1 application topically daily as needed (sores). 07/17/20  Yes [provider]  ipratropium (ATROVENT HFA) 17 MCG/ACT inhaler Inhale 2 puffs into the lungs every 6 (six) hours.   Yes [provider]  Multiple Vitamins-Minerals (DECUBI-VITE) CAPS Take 1 capsule by mouth daily. 07/04/20  Yes [provider]  nitroGLYCERIN (NITROSTAT) 0.4 MG SL tablet Place 1 tablet (0.4 mg total) under the tongue every 5 (five) minutes as needed for chest pain. 08/12/19 11/10/19 Yes Richardo Priest, MD  propranolol (INDERAL) 20 MG tablet Take 20 mg by mouth 2 (two) times daily. 11/18/17  Yes [provider]  Pumpkin Seed-Soy Germ (AZO BLADDER CONTROL/GO-LESS) CAPS Take 1 capsule by mouth daily as needed (infection). 07/30/20  Yes [provider]  spironolactone (ALDACTONE) 25 MG tablet Take 12.5 mg by mouth every morning. 01/14/18  Yes [provider]  SYNTHROID 100 MCG tablet Take 100 mcg by mouth daily. 07/20/19  Yes [provider]  traMADol (ULTRAM) 50 MG tablet Take 50 mg by mouth 3 (three) times daily as needed for pain. 07/05/20  Yes [provider]  VISINE DRY EYE RELIEF 1 % SOLN Place 1 drop into both eyes daily. 06/30/20  Yes [provider]  Vitamin D, Ergocalciferol, (DRISDOL) 1.25 MG (50000 UT) CAPS capsule Take 50,000 Units by mouth 2 (two) times a week. Sundays and Wednesdays 08/28/18  Yes [provider]  torsemide (DEMADEX) 20 MG tablet Take 1 tablet (20 mg total) by mouth 2 (two) times daily. Take an extra 20 mg (1  tablet) in the AM if your weight is 168 or greater. Patient not taking: Reported on 07/31/2020 01/28/18 04/28/18  Richardo Priest, MD    Allergies    Codeine, Quinine derivatives, Sulfa antibiotics, Adhesive [tape], Penicillins, and Procardia [nifedipine]  Review of Systems   Review of Systems  Unable to perform ROS: Mental status change    Physical Exam Updated Vital Signs BP 130/60   Pulse 78   Temp 97.7 F (36.5 C) (Rectal)   Resp 14   Ht 5\' 2"  (1.575 m)   Wt 70.4 kg   SpO2 94%   BMI 28.39 kg/m   Physical Exam Vitals and nursing note reviewed.  Constitutional:      Appearance: She is well-developed.  HENT:     Head: Normocephalic and  atraumatic.     Right Ear: External ear normal.     Left Ear: External ear normal.     Nose: Nose normal.     Mouth/Throat:     Mouth: Mucous membranes are dry.  Eyes:     General:        Right eye: No discharge.        Left eye: No discharge.     Extraocular Movements: Extraocular movements intact.     Pupils: Pupils are equal, round, and reactive to light.  Cardiovascular:     Rate and Rhythm: Normal rate and regular rhythm.     Heart sounds: Normal heart sounds.  Pulmonary:     Effort: Pulmonary effort is normal.     Comments: Mild basilar crackles Abdominal:     General: There is no distension.     Palpations: Abdomen is soft.     Tenderness: There is no abdominal tenderness.  Skin:    General: Skin is warm and dry.  Neurological:     Mental Status: She is alert. She is disoriented.     Comments: She is disoriented to day and year. Knows it's May. Perhaps mild right facial droop. 5/5 strength in BUE. Barely able to lift right leg off stretcher. Cannot lift left leg off stretcher.   Psychiatric:        Mood and Affect: Mood is not anxious.     ED Results / Procedures / Treatments   Labs (all labs ordered are listed, but only abnormal results are displayed) Labs Reviewed  CBC - Abnormal; Notable for the following  components:      Result Value   RBC 2.90 (*)    Hemoglobin 10.7 (*)    HCT 32.7 (*)    MCV 112.8 (*)    MCH 36.9 (*)    Platelets 137 (*)    All other components within normal limits  COMPREHENSIVE METABOLIC PANEL - Abnormal; Notable for the following components:   Sodium 130 (*)    Chloride 93 (*)    Glucose, Bld 908 (*)    BUN 24 (*)    Creatinine, Ser 1.68 (*)    Calcium 10.4 (*)    Total Protein 6.2 (*)    Albumin 2.3 (*)    Total Bilirubin 1.6 (*)    GFR, Estimated 31 (*)    All other components within normal limits  URINALYSIS, ROUTINE W REFLEX MICROSCOPIC - Abnormal; Notable for the following components:   APPearance CLOUDY (*)    Glucose, UA >=500 (*)    Hgb urine dipstick SMALL (*)    Leukocytes,Ua LARGE (*)    Bacteria, UA MANY (*)    All other components within normal limits  I-STAT CHEM 8, ED - Abnormal; Notable for the following components:   Sodium 131 (*)    Chloride 94 (*)    BUN 28 (*)    Creatinine, Ser 1.40 (*)    Glucose, Bld >700 (*)    Hemoglobin 10.5 (*)    HCT 31.0 (*)    All other components within normal limits  CBG MONITORING, ED - Abnormal; Notable for the following components:   Glucose-Capillary >600 (*)    All other components within normal limits  I-STAT VENOUS BLOOD GAS, ED - Abnormal; Notable for the following components:   Bicarbonate 29.1 (*)    Acid-Base Excess 3.0 (*)    Sodium 132 (*)    HCT 32.0 (*)    Hemoglobin 10.9 (*)  All other components within normal limits  CBG MONITORING, ED - Abnormal; Notable for the following components:   Glucose-Capillary >600 (*)    All other components within normal limits  CBG MONITORING, ED - Abnormal; Notable for the following components:   Glucose-Capillary >600 (*)    All other components within normal limits  CBG MONITORING, ED - Abnormal; Notable for the following components:   Glucose-Capillary 475 (*)    All other components within normal limits  URINE CULTURE  SARS CORONAVIRUS  2 (TAT 6-24 HRS)  PROTIME-INR  APTT  DIFFERENTIAL  BETA-HYDROXYBUTYRIC ACID  CREATININE, URINE, RANDOM  SODIUM, URINE, RANDOM  OSMOLALITY  BASIC METABOLIC PANEL  BASIC METABOLIC PANEL  BASIC METABOLIC PANEL  VITAMIN [id number]  FOLATE  PATHOLOGIST SMEAR REVIEW  TSH  HEMOGLOBIN [id number]  BASIC METABOLIC PANEL    EKG EKG Interpretation  Date/Time:  Monday Jul 31 2020 09:37:30 EDT Ventricular Rate:  80 PR Interval:  185 QRS Duration: 81 QT Interval:  396 QTC Calculation: 457 R Axis:   58 Text Interpretation: Sinus rhythm Abnormal R-wave progression, early transition No old tracing to compare Confirmed by Sherwood Gambler [phone]) on 07/31/2020 9:41:36 AM   Radiology DG Chest 2 View  Result Date: 07/31/2020 CLINICAL DATA:  Chest pain, altered mental status EXAM: CHEST - 2 VIEW COMPARISON:  09/23/2019 FINDINGS: Heart is normal size. Left base atelectasis or infiltrate. Right lung clear. No effusions or acute bony abnormality. IMPRESSION: Left base atelectasis or infiltrate. Electronically Signed   By: Rolm Baptise M.D.   On: 07/31/2020 10:52   MR BRAIN WO CONTRAST  Result Date: 07/31/2020 CLINICAL DATA:  Neuro deficit, acute stroke suspected. EXAM: MRI HEAD WITHOUT CONTRAST TECHNIQUE: Multiplanar, multiecho pulse sequences of the brain and surrounding structures were obtained without intravenous contrast. COMPARISON:  Same day CT head. FINDINGS: Brain: No acute infarction, hemorrhage, hydrocephalus, extra-axial collection or mass lesion. Mild for age generalized cerebral volume loss. Mild for age scattered T2/FLAIR hyperintensities within the white matter, most likely related to chronic microvascular ischemic disease. Cavum septum pellucidum et vergae, anatomic variant. Vascular: Major arterial flow voids are maintained at the skull base. Skull and upper cervical spine: Normal marrow signal. Sinuses/Orbits: Complete opacification of the right sphenoid sinus which is expanded and largely T2  hypointense and T1 hyperintense. There were areas of internal hyperdensity seen on same day CT head. Other: No mastoid effusions. IMPRESSION: 1. No evidence of acute intracranial abnormality. Specifically, no acute infarct. 2. Complete opacification of an expanded right sphenoid sinus with findings suggestive of fungal sinusitis, as detailed above. Electronically Signed   By: Margaretha Sheffield MD   On: 07/31/2020 13:29   CT HEAD CODE STROKE WO CONTRAST  Result Date: 07/31/2020 CLINICAL DATA:  Code stroke. Acute neuro deficit. Right facial droop EXAM: CT HEAD WITHOUT CONTRAST TECHNIQUE: Contiguous axial images were obtained from the base of the skull through the vertex without intravenous contrast. COMPARISON:  CT head 12/22/2012 FINDINGS: Brain: Progressive atrophy. Negative for hydrocephalus. Mild patchy white matter hypodensity with progression. Negative for acute infarct, hemorrhage, mass. Vascular: Negative for hyperdense vessel Skull: Negative Sinuses/Orbits: Extensive opacification of the sphenoid sinus with hyperdensity and expansion of the sinus on the right. This area was clear previously. Remaining sinuses clear. Other: None ASPECTS ([place] Stroke Program Early CT Score) - Ganglionic level infarction (caudate, lentiform nuclei, internal capsule, insula, M1-M3 cortex): 7 - Supraganglionic infarction (M4-M6 cortex): 3 Total score (0-10 with 10 being normal): 10 IMPRESSION: 1. No acute intracranial abnormality  2. ASPECTS is 10 3. Extensive expansile opacification and hyperdensity throughout the right sphenoid sinus. This may be due to sinusitis with superimposed fungal infection versus mass lesion. ENT consultation recommended. 4. Code stroke imaging results were communicated on 07/31/2020 at 9:31 am to provider Rory Percy via text page Electronically Signed   By: Franchot Gallo M.D.   On: 07/31/2020 09:32    Procedures Procedures   Medications Ordered in ED Medications  insulin regular, human  (MYXREDLIN) 100 units/ 100 mL infusion (7.5 Units/hr Intravenous Rate/Dose Change 07/31/20 1520)  lactated ringers infusion ( Intravenous New Bag/Given 07/31/20 1157)  dextrose 5 % in lactated ringers infusion (0 mLs Intravenous Hold 07/31/20 1149)  dextrose 50 % solution 0-50 mL (has no administration in time range)  potassium chloride 10 mEq in 100 mL IVPB (10 mEq Intravenous New Bag/Given 07/31/20 1512)  cefTRIAXone (ROCEPHIN) 1 g in sodium chloride 0.9 % 100 mL IVPB (has no administration in time range)  levothyroxine (SYNTHROID) tablet 100 mcg (has no administration in time range)  acetaminophen (TYLENOL) tablet 650 mg (has no administration in time range)    Or  acetaminophen (TYLENOL) suppository 650 mg (has no administration in time range)  polyethylene glycol (MIRALAX / GLYCOLAX) packet 17 g (has no administration in time range)  aspirin EC tablet 81 mg (has no administration in time range)  albuterol (VENTOLIN HFA) 108 (90 Base) MCG/ACT inhaler 2 puff (has no administration in time range)  propranolol (INDERAL) tablet 10 mg (has no administration in time range)  ipratropium (ATROVENT HFA) inhaler 2 puff (has no administration in time range)  heparin injection 5,000 Units (5,000 Units Subcutaneous Given 07/31/20 1512)  sodium chloride flush (NS) 0.9 % injection 3 mL (3 mLs Intravenous Given 07/31/20 1004)  lactated ringers bolus 1,000 mL (0 mLs Intravenous Stopped 07/31/20 1112)  cefTRIAXone (ROCEPHIN) 1 g in sodium chloride 0.9 % 100 mL IVPB (0 g Intravenous Stopped 07/31/20 1401)    ED Course  I have reviewed the triage vital signs and the nursing notes.  Pertinent labs & imaging results that were available during my care of the patient were reviewed by me and considered in my medical decision making (see chart for details).    MDM Rules/Calculators/A&P                          Patient does not appear to have an acute code stroke.  This was canceled by neurology.  It seems like her  altered mental status is likely due to the hyperglycemia which is probably HHS.  Also has AKI and was given fluids.  Will treat with Rocephin for probable UTI.  Otherwise I discussed with family and she will be admitted to internal medicine teaching service.  Of note, her CT did indicate possible sinusitis/fungal infection and she probably will need ENT consult as an inpatient. Final Clinical Impression(s) / ED Diagnoses Final diagnoses:  AKI (acute kidney injury) ([place])  Hyperosmolar hyperglycemic state (HHS) ([place])  Acute urinary tract infection    Rx / DC Orders ED Discharge Orders    None       Sherwood Gambler, MD 07/31/20 1533

## 2020-07-31 NOTE — Consult Note (Signed)
Neurology Consultation  Reason for Consult: Code Stroke for altered mental status / confusion Referring Physician: Dr. Regenia Skeeter  CC: Confusion, hallucinations   History is obtained from: EMS, Patient, Chart Review  HPI: Casey Humphrey is a 75 y.o. 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 Per EMS, Casey Humphrey was discharged home from a skilled nursing facility on Friday, Jul 28, 2020 in her normal mental state. On Saturday 5/14 she sustained a fall at home while trying to get up to her wheelchair but denies hitting her head or loss of consciousness. Since 5/14, Casey Humphrey has been having hallucinations of her late relatives. EMS was activated this morning by family due to concern for acute confusion and altered mental status. EMS was initially concerned with a right facial droop and dysarthria but on arrival described her speech as "skipping a few words" without dysarthria. A blood glucose was obtained en route with results that read "high" meaning a blood glucose of > 600. Casey Humphrey states that she was taken off of her diabetes medication approximately one year ago because "I did not need it, I was off of the sugar". On arrival, patient complains of lower extremity edema and states that this happens with increased blood glucose levels.   At baseline, Casey Humphrey lives with her daughter and her sister, [name] takes care of her finances. Ms. Pontrelli is responsible for managing her own medications. Per [name], Casey Humphrey does have a mild cognitive delay but is unable to elaborate further and there is no documented history of such on chart review. Prior to her stay at the skilled nursing facility,  Casey Humphrey had been walking with a walker or cane but since her discharge from the nursing facility, she has not had physical therapy to learn how to transfer from her bed to a wheelchair or from her wheelchair to her potty chair. [name] daughter and boyfriend have been assisting her out of bed to a recliner or a wheelchair at home.   LKW: 5/15 21:00 tpa given?: no, outside of time window IR Thrombectomy? No, presentation with low suspicion for LVO- exam without focal deficits Modified Rankin Scale: 4-Needs assistance to walk and tend to bodily needs  ROS: A complete ROS was performed and is negative except as noted in the HPI.  Past Medical History:  Diagnosis Date  . Anginal pain ([place])    takes nitro approx once per month  . Anxiety   . Arthritis   . Asthma    occ. Bronchial problems  . Bleeder's disease    "free Bleeder"  . CHF (congestive heart failure) ([place])   . Coronary artery disease   . Diabetes mellitus without complication ([place])   . Family history of adverse reaction to anesthesia    sister has n/v  . Frequency of urination   . GERD (gastroesophageal reflux disease)   . History of kidney stones   . Hypertension   . Hyperthyroidism   . Meniscus tear    left knee  . Myocardial infarction [facility]) 1980's   " it was a mild one"  . Neuromuscular disorder ([facility])    diabetic neuropathy feet  . Nocturia   . Shortness of breath dyspnea    with exertion  .  [facility])    left leg affected'14-no problems now   Past Surgical History:  Procedure Laterality Date  . ABDOMINAL HYSTERECTOMY    . CARDIAC CATHETERIZATION     8'14-[facility]  . CATARACT EXTRACTION, BILATERAL Bilateral   . CHOLECYSTECTOMY     open  . CHONDROPLASTY Left 04/27/2014   Procedure: abrasion CHONDROPLASTY of medial femoral condyle, abrasion chondroplasty of patella;  Surgeon: Tobi Bastos, MD;  Location: [facility] ORS;  Service: Orthopedics;  Laterality: Left;  . COLONOSCOPY     polyp  removed  . EUS N/A 04/08/2013   Procedure: UPPER ENDOSCOPIC ULTRASOUND (EUS) LINEAR;  Surgeon: Milus Banister, MD;  Location: [facility] ENDOSCOPY;  Service: Endoscopy;  Laterality: N/A;  . KNEE ARTHROSCOPY WITH LATERAL MENISECTOMY Left 04/27/2014   Procedure: LEFT KNEE ARTHROSCOPY WITH  medial and LATERAL MENISECTOMY.;  Surgeon: Tobi Bastos, MD;  Location: [facility]S;  Service: Orthopedics;  Laterality: Left;   Family History  Problem Relation Age of Onset  . CAD Sister    Social History:   reports that she has never smoked. She has never used smokeless tobacco. She reports that she does not drink alcohol and does not use drugs.  Medications  Current Facility-Administered Medications:  .  sodium chloride flush (NS) 0.9 % injection 3 mL, 3 mL, Intravenous, Once, Sherwood Gambler, MD  Current Outpatient Medications:  .  albuterol (PROVENTIL HFA;VENTOLIN HFA) 108 (90 Base) MCG/ACT inhaler, Inhale 2 puffs into the lungs every 6 (six) hours as needed for wheezing or shortness of breath., Disp: 1 Inhaler, Rfl: 2 .  aspirin EC 81 MG tablet, Take 81 mg by mouth daily., Disp: , Rfl:  .  fluticasone (FLONASE) 50 MCG/ACT nasal spray, Place 2 sprays into both nostrils daily., Disp: , Rfl:  .  furosemide (LASIX) 40 MG tablet, Take 1 tablet (40 mg total) by mouth daily as needed., Disp: 30 tablet, Rfl: 3 .  ipratropium (ATROVENT HFA) 17 MCG/ACT inhaler, Inhale 2 puffs into the lungs every 6 (six) hours., Disp: , Rfl:  .  nitroGLYCERIN (NITROSTAT) 0.4 MG SL tablet, Place 1 tablet (0.4 mg total) under the tongue every 5 (five) minutes as needed for chest pain., Disp: 90 tablet, Rfl: 3 .  propranolol (INDERAL) 20 MG tablet, TK 1 T PO BID, Disp: , Rfl: 0 .  spironolactone (ALDACTONE) 25 MG tablet, TK 0.5 T PO QAM, Disp: , Rfl: 1 .  SYNTHROID 100 MCG tablet, Take 100 mcg by mouth daily., Disp: , Rfl:  .  torsemide (DEMADEX) 20 MG tablet, Take 1 tablet (20 mg total) by mouth 2 (two) times daily. Take an extra 20 mg  (1 tablet) in the AM if your weight is 168 or greater., Disp: 180 tablet, Rfl: 1 .  Vitamin D, Ergocalciferol, (DRISDOL) 1.25 MG (50000 UT) CAPS capsule, TAKE 1 CAPSULE PO TWICE WEEKLY, Disp: , Rfl:   Exam: Current vital signs: BP 112/74   Pulse 82   Temp 97.7 F (36.5 C) (Rectal)   Resp 18   SpO2 98%  Vital signs in last 24 hours: Temp:  [97.7 F (36.5 C)] 97.7 F (36.5 C) (05/16 0938) Pulse Rate:  [82] 82 (05/16 0937) Resp:  [18] 18 (05/16 0937) BP: (112)/(74) 112/74 (05/16 0937) SpO2:  [98 %] 98 % (05/16 0937)  GENERAL: Awake, alert, in no acute distress Psych: Affect appropriate for situation Head: Normocephalic with scab noted on right forehead, no obvious deformity EENT: Normal conjunctivae, no OP obstruction, dry mm LUNGS:  Normal respiratory effort, non-labored breathing CV: Regular rate on telemetry. Right pedal edema 1+, left pedal edema 2+, pitting.  ABDOMEN: Soft, non-tender Ext: warm, without obvious deformity.   NEURO:  Mental Status: Awake, alert, and oriented to self, age, and month initially. On reassessment, when asked the year she repeats "May" and when asked the month she states "May"; when asked the year again she states "year". She is able to state that she is in the hospital. Poor attention is noted.  Naming and repetition are intact.  Speech/Language: speech is intact with mild dysarthria but without aphasia. No neglect is noted on examination.   Cranial Nerves:  II: PERRL 3 mm/brisk. Visual fields full.  III, IV, VI: EOMI with saccadic pursuits.  V: Sensation is intact to light touch and symmetrical to face.  VII: Face is symmetric resting and smiling with possible initial subtle right mouth droop.  VIII: Hearing is intact to voice IX, X: Palate elevation is symmetric. Phonation normal.  XI: Shoulder shrug intact and symmetric XII: Tongue protrudes midline without fasciculations.   Motor: Bilateral upper extremities with 4/5 strength without vertical  drift on assessment but with mild asterixis bilaterally. Left lower extremity with immediate drift to bed and a pain limited assessment- per EMS patient with fracture of left lower extremity, unspecified. Right lower extremity with some antigravity movement but with some vertical drift on assessment.  Tone is normal. Bulk is normal.  Sensation: Intact to light touch bilaterally in lower extremities. Decreased sensation to light touch reported in right upper extremity compared to left upper extremity.  Coordination: FTN intact bilaterally. Unable to assess HKS bilaterally due to lower extremity weakness. DTRs: 2+ and symmetric biceps  Gait: Deferred  NIHSS: 1a Level of Conscious.: 0 1b LOC Questions: 0 1c LOC Commands: 0 2 Best Gaze: 0 3 Visual: 0 4 Facial Palsy: 0 5a Motor Arm - left: 0 5b Motor Arm - Right: 0 6a Motor Leg - Left: 2; baseline weakness with reported recent LLE fx  6b Motor Leg - Right: 1 7 Limb Ataxia: 0 8 Sensory: 1; reports decreased sensation of the RUE 9 Best Language: 0 10 Dysarthria: 1 11 Extinct. and Inatten.: 0 TOTAL: 5  Labs I have reviewed labs in epic and the results pertinent to this consultation are: CBC    Component Value Date/Time   WBC 5.3 11/04/2017 1109   WBC 6.2 04/22/2014 1135   RBC 3.39 (L) 11/04/2017 1109   RBC 3.90 04/22/2014 1135   HGB 10.5 (L) 07/31/2020 0929   HGB 12.1 11/04/2017 1109   HCT 31.0 (L) 07/31/2020 0929   HCT 36.1 11/04/2017 1109   PLT 112 (L) 11/04/2017 1109   MCV 107 (H) 11/04/2017 1109   MCH 35.7 (H) 11/04/2017 1109   MCH 33.8 04/22/2014 1135   MCHC 33.5 11/04/2017 1109   MCHC 33.2 04/22/2014 1135   RDW 13.1 11/04/2017 1109   CMP     Component Value Date/Time   NA 131 (L) 07/31/2020 0929   NA 140 11/04/2017 1109   K 4.2 07/31/2020 0929   CL 94 (L) 07/31/2020 0929   CO2 21 11/04/2017 1109   GLUCOSE >700 (HH) 07/31/2020 0929   BUN 28 (H) 07/31/2020 0929   BUN 12 11/04/2017 1109   CREATININE 1.40 (H)  07/31/2020 0929   CALCIUM 10.3 11/04/2017 1109   GFRNONAA 72 11/04/2017 1109   GFRAA 83 11/04/2017 1109   Lipid Panel  No results found for: CHOL, TRIG, HDL, CHOLHDL, VLDL,  LDLCALC, LDLDIRECT No results found for: HGBA1C  Imaging I have reviewed the images obtained:  CT-scan of the brain: 1. No acute intracranial abnormality 2. ASPECTS is 10 3. Extensive expansile opacification and hyperdensity throughout the right sphenoid sinus. This may be due to sinusitis with superimposed fungal infection versus mass lesion. ENT consultation recommended.  Assessment: 75 year old female with PMHx of CHF, CAD, CVA with LLE residual weakness, type 2 DM, HTN, and MI who presented as a CODE STROKE for evaluation of confusion, hallucinations, and altered mental status and a recent fall at home on 5/14. Initial work up revealed a blood glucose of 908, negative CT imaging, a leukocyte + UTI, and UA with a glucose of > 500. - Examination reveals patient with mild dysarthria and intermittent confusion without orientation completely intact. Initial NIHSS is 5 with some baseline LLE weakness (with some pain limitation), mild dysarthria, and a RLE drift. Her modified rankin score is a 4 requiring assistance to get out of bed.  - CT imaging without acute abnormality. - Presentation felt to be less likely stroke and more consistent with encephalopathy; likely toxic / metabolic with an initial blood glucose of 908- patient reports being off of her diabetic medications for approximately one year. Also found to have a leukocyte + UTI without leukocytosis or fever. - Creatinine on admission at 1.68 with last documented creatinine 0.7 - 0.8 for baseline, though this was 2 years ago.  - Patient not a candidate for thrombolytic therapy due to LKW at 21:00 last night and is not an endovascular candidate due to presentation not consistent with LVO.   Impression: Acute encephalopathy- likely toxic/metabolic    Recommendations: - MRI brain without contrast; will defer further stroke work up unless evidence of acute or subacute stroke identified on MRI - Management of metabolic derangements / hyperglycemia per EDP / primary team - Please notify neurology if MRI with presence of stroke  Anibal Henderson, AGAC-NP [facility] Pager: [phone]    ATTENDING ADDENDUM Patient seen and examined at the request of Dr. Rayburn Felt an acute code stroke for confusion, right-sided weakness. Agree with history and physical above. Briefly: 75 year old with past history of stroke with minimal left lower extremity deficits at baseline, congestive heart failure, coronary artery disease, type 2 diabetes with neuropathy, hypertension, hypothyroidism and CAD event to bed 9 PM yesterday which was her last known normal, and brought in today for concern for strokelike symptoms. It was reported that she was acting confused, was skipping words while speaking and had some dysarthria.  EMS was called, her blood sugars were greater than 600 on their instrument.  They activated a code stroke for word finding difficulty and also for some questionable right facial weakness. On my examination, she is awake alert, poor attention concentration, some perseveration, no focal motor weakness-both upper extremities at least 4+/5 in both lower extremities with right lower extremity with some antigravity movement 3/5 in left lower extremity 4/5. Intact sensation and intact coordination. NIH stroke scale 5 Noncontrast head CT unremarkable for acute process-aspects 10.  No bleed Blood sugars greater than 600-on BMP upwards of 900. Patient's modified Rankin is poor at baseline at 4-not a candidate for EVT even if there was an LVO-hence vessel imaging not performed. Not a candidate for tPA due to last known normal at 9 PM yesterday. Recommendations-MRI, if there is a stroke, pursue stroke work-up otherwise treat  hyperglycemia and metabolic derangements. MRI was completed in the emergency room-no acute stroke. There is  complete opacification of the right sphenoid sinus with findings suggestive of fungal sinusitis as detailed in the radiology report. Management of fungal sinusitis and hyperglycemia per primary team Symptoms likely consistent with toxic metabolic encephalopathy No further inpatient neurological work-up at this time Please call inpatient neurology if we can be of further assistance. Plan was discussed with Dr. Karsten Ro in the ER.   -- Amie Portland, MD Neurologist [facility] Pager: [phone]   CRITICAL CARE ATTESTATION Performed by: Amie Portland, MD Total critical care time: 39 minutes Critical care time was exclusive of separately billable procedures and treating other patients and/or supervising APPs/Residents/Students Critical care was necessary to treat or prevent imminent or life-threatening deterioration due to toxic metabolic encephalopathy, HHS This patient is critically ill and at significant risk for neurological worsening and/or death and care requires constant monitoring. Critical care was time spent personally by me on the following activities: development of treatment plan with patient and/or surrogate as well as nursing, discussions with consultants, evaluation of patient's response to treatment, examination of patient, obtaining history from patient or surrogate, ordering and performing treatments and interventions, ordering and review of laboratory studies, ordering and review of radiographic studies, pulse oximetry, re-evaluation of patient's condition, participation in multidisciplinary rounds and medical decision making of high complexity in the care of this patient.
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
## 2020-07-31 NOTE — ED Notes (Signed)
Attempted to call report

## 2020-07-31 NOTE — H&P (Addendum)
Date: 07/31/2020               Patient Name:  Casey Humphrey MRN: [id number]  DOB: 01/16/1946 Age / Sex: 75 y.o., female   PCP: Angelina Sheriff, MD         Medical Service: Internal Medicine Teaching Service         Attending Physician: Dr. Jimmye Norman    First Contact: Dr. Lisabeth Devoid Pager: [phone]  Second Contact: Gilford Rile, MD, Remo Lipps Pager: SW [phone])       After Hours (After 5p/  First Contact Pager: [phone]  weekends / holidays): Second Contact Pager: [phone]   Chief Complaint: Altered Mental Status, hallucination   History of Present Illness: Casey Humphrey is a 75 year old woman with medical history significant for hypertension, type 2 diabetes mellitus, vitamin D deficiency, chronic diastolic heart failure, CAD, prior CVA with left lower extremity deficit, GERD, osteoarthritis, hypothyroidism presenting to the emergency department with altered mental status.  I was able to speak to patient's Sister Casey Humphrey (phone number [phone]) who is also the primary caregiver for the patient and her son.  She states that for the past couple months, Casey Humphrey has been having cognitive decline as she behaves out of the norm.  There are times where she pushes buttons and fidgets without any prompting.  Other times, she experiences hallucination and stares of to the cemetery where her parents were buried.  At the beginning of the year, she tells me that she was admitted to the hospital and treated for pneumonia and subsequently discharged to a skilled nursing facility in April were she was recently discharged home.  At home, she has a nursing aide that helps her daily.  Casey Humphrey's daughter assists her with feeding and Casey Humphrey helps her with the pill packs.  Casey Humphrey is able to communicate but as stated, she has had episodes of hallucinations.  Casey Humphrey brought Casey Humphrey to the hospital because she states that when she woke up this morning, her confusion had worsened when she noticed that she had began  seeing dead relatives and she had noticed right-sided facial droop.   Upon speaking to [facility], she tells me that for the past couple weeks she has been experiencing dysuria, polyuria, polydipsia, fevers, chills but denies abdominal pain, nausea, vomiting.  She states that her primary care doctor has taken her off her diabetes regimen however on further questioning she states that she was receiving medicine for diabetes at the nursing facility ([facility])   ED course: Afebrile, BP unremarkable, SPO2 unremarkable, pulse unremarkable.  Initially code stroke was activated but patient was noted to be outside of the tPA window.  CT head showed no intracranial abnormalities.  Chest x-ray does not reveal any pulmonary infiltrates.  CBC showed hemoglobin 10.7, MCV 112.  Metabolic panel showed sodium 130, bicarb 27, glucose 108, creatinine 1.68.  Urinalysis showed pyuria with many bacteria, glucose greater than 500.  She was given IV fluids and started on insulin infusion.  Also received ceftriaxone.   Lab Orders     Urine culture     SARS CORONAVIRUS 2 (TAT 6-24 HRS) Nasopharyngeal Nasopharyngeal Swab     Protime-INR     APTT     CBC     Differential     Comprehensive metabolic panel     Urinalysis, Routine w reflex microscopic     Beta-hydroxybutyric acid     Osmolality     Basic metabolic panel  Vitamin B12     Folate, serum, performed at [facility] lab     Pathologist smear review     Creatinine, urine, random     Sodium, urine, random     TSH     Hemoglobin A1c     CBC     I-stat chem 8, ED     CBG monitoring, ED     I-Stat venous blood gas, ED     CBG monitoring, ED     CBG monitoring, ED   Meds:  No current facility-administered medications on file prior to encounter.   Current Outpatient Medications on File Prior to Encounter  Medication Sig  . albuterol (PROVENTIL HFA;VENTOLIN HFA) 108 (90 Base) MCG/ACT inhaler Inhale 2 puffs into the lungs every 6 (six)  hours as needed for wheezing or shortness of breath.  Marland Kitchen aspirin EC 81 MG tablet Take 81 mg by mouth daily.  . fluticasone (FLONASE) 50 MCG/ACT nasal spray Place 2 sprays into both nostrils daily.  . furosemide (LASIX) 40 MG tablet Take 1 tablet (40 mg total) by mouth daily as needed.  Marland Kitchen ipratropium (ATROVENT HFA) 17 MCG/ACT inhaler Inhale 2 puffs into the lungs every 6 (six) hours.  . nitroGLYCERIN (NITROSTAT) 0.4 MG SL tablet Place 1 tablet (0.4 mg total) under the tongue every 5 (five) minutes as needed for chest pain.  Marland Kitchen propranolol (INDERAL) 20 MG tablet TK 1 T PO BID  . spironolactone (ALDACTONE) 25 MG tablet TK 0.5 T PO QAM  . SYNTHROID 100 MCG tablet Take 100 mcg by mouth daily.  Marland Kitchen torsemide (DEMADEX) 20 MG tablet Take 1 tablet (20 mg total) by mouth 2 (two) times daily. Take an extra 20 mg (1 tablet) in the AM if your weight is 168 or greater.  . Vitamin D, Ergocalciferol, (DRISDOL) 1.25 MG (50000 UT) CAPS capsule TAKE 1 CAPSULE PO TWICE WEEKLY     Allergies: Allergies as of 07/31/2020 - Review Complete 07/31/2020  Allergen Reaction Noted  . Codeine Nausea And Vomiting 04/08/2013  . Quinine derivatives Nausea And Vomiting 04/08/2013  . Adhesive [tape] Rash 04/07/2013  . Penicillins Rash 04/08/2013  . Procardia [nifedipine] Rash 04/08/2013   Past Medical History:  Diagnosis Date  . Anginal pain ([place])    takes nitro approx once per month  . Anxiety   . Arthritis   . Asthma    occ. Bronchial problems  . Bleeder's disease    "free Bleeder"  . CHF (congestive heart failure) ([place])   . Coronary artery disease   . Diabetes mellitus without complication ([place])   . Family history of adverse reaction to anesthesia    sister has n/v  . Frequency of urination   . GERD (gastroesophageal reflux disease)   . History of kidney stones   . Hypertension   . Hyperthyroidism   . Meniscus tear    left knee  . Myocardial infarction [facility]) 1980's   " it was a mild one"  . Neuromuscular  disorder ([facility])    diabetic neuropathy feet  . Nocturia   . Shortness of breath dyspnea    with exertion  . Stroke [facility])    left leg affected'14-no problems now    Family History  Problem Relation Age of Onset  . CAD Sister     Social History   Socioeconomic History  . Marital status: Widowed    Spouse name: Not on file  . Number of children: Not on file  . Years of education: Not  on file  . Highest education level: Not on file  Occupational History  . Not on file  Tobacco Use  . Smoking status: Never Smoker  . Smokeless tobacco: Never Used  Vaping Use  . Vaping Use: Never used  Substance and Sexual Activity  . Alcohol use: No  . Drug use: No  . Sexual activity: Not Currently  Other Topics Concern  . Not on file  Social History Narrative  . Not on file   Social Determinants of Health   Financial Resource Strain: Not on file  Food Insecurity: Not on file  Transportation Needs: Not on file  Physical Activity: Not on file  Stress: Not on file  Social Connections: Not on file  Intimate Partner Violence: Not on file   Ronetta lives at home with her son and sister who is the caregiver for both.  No reports of EtOH, cigarette or illicit drug use.  Review of Systems: A complete ROS was negative except as per HPI.   Physical Exam: Blood pressure 133/68, pulse 78, temperature 97.7 F (36.5 C), temperature source Rectal, resp. rate 16, height 5\' 2"  (1.575 m), weight 70.4 kg, SpO2 97 %. Physical Exam Vitals and nursing note reviewed.  Constitutional:      Appearance: She is not ill-appearing, toxic-appearing or diaphoretic.  HENT:     Head: Normocephalic and atraumatic.     Mouth/Throat:     Mouth: Mucous membranes are moist.  Eyes:     General: No scleral icterus.    Conjunctiva/sclera: Conjunctivae normal.     Pupils: Pupils are equal, round, and reactive to light.  Cardiovascular:     Rate and Rhythm: Normal rate and regular rhythm.     Heart sounds: No  murmur heard.   Pulmonary:     Breath sounds: Normal breath sounds. No wheezing or rales.  Abdominal:     General: Bowel sounds are normal.     Tenderness: There is no abdominal tenderness.  Musculoskeletal:     Cervical back: Normal range of motion. No rigidity.     Right lower leg: No edema.     Left lower leg: No edema.  Skin:    Findings: Bruising and lesion present. No erythema or rash.  Neurological:     Comments: Neurologic exam: Mental status: A&O to name and city Cranial Nerves: II: PERRL III, IV, VI: Extra-occular motions intact bilaterally V, VII: LEFT facial droop, sensation intact in all 3 divisions  VIII: hearing normal to rubbing fingers bilaterally  IX, X: palate rises symmetrically XI: Head turn and shoulder shrug normal bilaterally  XII: tongue midline  Motor: Strength 4-5/5 on all upper extremities, Bilateral lower extremity weakness (Limited neuro exam due to L. Knee pain)  Tendon Reflexes: decreased  Gait: unable to access  Sensory: Light touch intact and symmetric bilaterally  Coordination: Mild dysmetria on finger-to-nose.  Psychiatric:        Mood and Affect: Mood normal.     EKG: personally reviewed my interpretation is SR  CXR: personally reviewed my interpretation is no pulmonary infiltrates  Assessment & Plan by Problem: Principal Problem:   Acute metabolic and infectious encephalopathy Active Problems:   Acute symptomatic hyperglycemia   UTI (urinary tract infection)   Ms. Bracknell is a 75 year old woman with medical history significant for hypertension, type 2 diabetes mellitus, vitamin D deficiency, chronic diastolic heart failure, CAD, prior CVA with left lower extremity deficit, GERD, osteoarthritis, hypothyroidism  here for management of acute metabolic encephalopathy and infectious  secondary to acute hyperglycemia and urinary tract  infection   #Acute metabolic and infectious encephalopathy #Acute hypoglycemia versus hyperglycemic hyperosmolar syndrome #Concern for CVA Presents with altered mental status and found to have serum blood glucose greater than 900 without acidosis.  Per report, states that she has been off her diabetes medications per her PCP.  Another precipitating factor for her hypoglycemia this the underlying urinary tract infection. ACS less likely.  She did appear to have left-sided facial droop however no dysarthria, aphasia.  Initially code stroke was activated but she was out of the tPA window.  Initial CT head was unremarkable.  Neurology had recommended MRI brain -Admit to stepdown unit -Start on insulin infusiom -Potassium supplementation as needed -IVF with LR until CBG less than 250 -Switch to D5-1/2 NS when 1 CBG less than 250 -Nothing by mouth  -BMET every 4 hours -CBG Q1H -Once CBG between 140-180, start CM diet and if able to eat, administer Lantus 15 units -Continue insulin drip for 1-2 more hours, then discontinue and start SSI-S  -DC fluids if eating, drinking, and off insulin infusion -Follow up A1C -Follow up MRI brain and Neuro recommendations   #Pseudohyponatremia secondary to hyperglycemia - Follow-up metabolic panel   #Urinary tract infection -Follow-up urine culture -Continue treatment with ceftriaxone   #Hypertension BP stable currently -Hold home spironolactone given AKI -Resume propranolol at lower dose to reduce the incidence of reflex tachycardia   #Chronic diastolic heart failure Appears euvolemic on exam -Torsemide 20 mg twice daily daily and Lasix 40 mg as needed -Hold diuretics for now given presumed AKI and dehydration from hyperglycemia -Closely monitor respiratory status and give diuretics if suspicion for volume overload appears   #Hypothyroidism -Follow-up TSH - Continue home Synthroid 100 mcg   #Microcytic anemia Hemoglobin of 10.7 on arrival  with an MCV of 112 -Follow-up vitamin B12 -Follow daily CBC   #Chronic thrombocytopenia -Follow blood smear   #Acute renal insufficiency vs acute on chronic CKD  Unclear of her baseline serum creatinine or GFR -Follow-up renal ultrasound, urine sodium, urine creatinine -Follow up daily metabolic panel    #Cognitive impairment Suspecting either Alzheimer's dementia versus Parkinson's disease.  Sister reports that for the past couple months, she has had gradual cognitive impairment with associated hallucination and fidgeting.  Given ongoing acute hypoglycemia and infection, she would require thorough mental status assessment after treatment of her hyperglycemia and urinary tract infection.   FEN: IV fluids, n.p.o. VTE ppx: Subcutaneous heparin CODE STATUS: DNR  Prior to Admission Living Arrangement: Home Anticipated Discharge Location: Pending Barriers to Discharge: Treatment of hyperglycemia and altered mental status  Dispo: Admit patient to Inpatient with expected length of stay greater than 2 midnights.  Signed: Jean Rosenthal, MD 07/31/2020, 12:59 PM  Pager: [phone] Internal Medicine Teaching Service After 5pm on weekdays and 1pm on weekends: On Call pager: [phone]

## 2020-07-31 NOTE — ED Notes (Signed)
Pt transported to Ultrasound.  

## 2020-07-31 NOTE — ED Notes (Signed)
Patient transported to MRI 

## 2020-07-31 NOTE — Significant Event (Addendum)
Patient's MRI returned and showed complete opacification of an expanded right sphenoid sinus with findings suggestive for fungal sinusitis.  Patient is immunocompromise and is at high risk for invasive fungal infection such as Aspergillus or mucormycosis.  In addition, she presented with an acute hyperglycemia with serum glucose greater than 032 which complicate her presentation.  Though there were no reports of facial pain or tenderness and her vitals have relatively been unremarkable, will go ahead and consult ENT for intranasal endoscopy with biopsy and start amphotericin B preemptively.  ADDENDUM:  Spoke with on call ENT concerning possible fungal sinusitis. Given no erosion of the bone, or vision loss, no emergent surgical internvention is necessary, but will have her follow up in the outpatient setting.  - D/C Amphotericin consult

## 2020-07-31 NOTE — ED Notes (Signed)
Attempted report x1. 

## 2020-08-01 ENCOUNTER — Inpatient Hospital Stay (HOSPITAL_COMMUNITY): Payer: Medicare Other

## 2020-08-01 LAB — CBC
HCT: 30.2 % — ABNORMAL LOW (ref 36.0–46.0)
Hemoglobin: 11.1 g/dL — ABNORMAL LOW (ref 12.0–15.0)
MCH: 37.5 pg — ABNORMAL HIGH (ref 26.0–34.0)
MCHC: 36.8 g/dL — ABNORMAL HIGH (ref 30.0–36.0)
MCV: 102 fL — ABNORMAL HIGH (ref 80.0–100.0)
Platelets: UNDETERMINED 10*3/uL (ref 150–400)
RBC: 2.96 MIL/uL — ABNORMAL LOW (ref 3.87–5.11)
RDW: 14 % (ref 11.5–15.5)
WBC: 8.5 10*3/uL (ref 4.0–10.5)
nRBC: 0 % (ref 0.0–0.2)

## 2020-08-01 LAB — GLUCOSE, CAPILLARY
Glucose-Capillary: 125 mg/dL — ABNORMAL HIGH (ref 70–99)
Glucose-Capillary: 140 mg/dL — ABNORMAL HIGH (ref 70–99)
Glucose-Capillary: 140 mg/dL — ABNORMAL HIGH (ref 70–99)
Glucose-Capillary: 141 mg/dL — ABNORMAL HIGH (ref 70–99)
Glucose-Capillary: 153 mg/dL — ABNORMAL HIGH (ref 70–99)
Glucose-Capillary: 167 mg/dL — ABNORMAL HIGH (ref 70–99)
Glucose-Capillary: 167 mg/dL — ABNORMAL HIGH (ref 70–99)
Glucose-Capillary: 191 mg/dL — ABNORMAL HIGH (ref 70–99)
Glucose-Capillary: 203 mg/dL — ABNORMAL HIGH (ref 70–99)
Glucose-Capillary: 207 mg/dL — ABNORMAL HIGH (ref 70–99)
Glucose-Capillary: 216 mg/dL — ABNORMAL HIGH (ref 70–99)
Glucose-Capillary: 218 mg/dL — ABNORMAL HIGH (ref 70–99)
Glucose-Capillary: 220 mg/dL — ABNORMAL HIGH (ref 70–99)
Glucose-Capillary: 259 mg/dL — ABNORMAL HIGH (ref 70–99)
Glucose-Capillary: 269 mg/dL — ABNORMAL HIGH (ref 70–99)
Glucose-Capillary: 281 mg/dL — ABNORMAL HIGH (ref 70–99)
Glucose-Capillary: 293 mg/dL — ABNORMAL HIGH (ref 70–99)

## 2020-08-01 LAB — HEMOGLOBIN A1C
Hgb A1c MFr Bld: 10.2 % — ABNORMAL HIGH (ref 4.8–5.6)
Mean Plasma Glucose: 246 mg/dL

## 2020-08-01 LAB — BASIC METABOLIC PANEL
Anion gap: 9 (ref 5–15)
Anion gap: 9 (ref 5–15)
BUN: 22 mg/dL (ref 8–23)
BUN: 19 mg/dL (ref 8–23)
CO2: 27 mmol/L (ref 22–32)
CO2: 25 mmol/L (ref 22–32)
Calcium: 10.3 mg/dL (ref 8.9–10.3)
Calcium: 10.5 mg/dL — ABNORMAL HIGH (ref 8.9–10.3)
Chloride: 99 mmol/L (ref 98–111)
Chloride: 101 mmol/L (ref 98–111)
Creatinine, Ser: 1.17 mg/dL — ABNORMAL HIGH (ref 0.44–1.00)
Creatinine, Ser: 1.12 mg/dL — ABNORMAL HIGH (ref 0.44–1.00)
GFR, Estimated: 48 mL/min — ABNORMAL LOW (ref >60)
GFR, Estimated: 51 mL/min — ABNORMAL LOW (ref >60)
Glucose, Bld: 240 mg/dL — ABNORMAL HIGH (ref 70–99)
Glucose, Bld: 183 mg/dL — ABNORMAL HIGH (ref 70–99)
Potassium: 3.9 mmol/L (ref 3.5–5.1)
Potassium: 4.5 mmol/L (ref 3.5–5.1)
Sodium: 135 mmol/L (ref 135–145)
Sodium: 135 mmol/L (ref 135–145)

## 2020-08-01 LAB — PATHOLOGIST SMEAR REVIEW

## 2020-08-01 LAB — MRSA PCR SCREENING: MRSA by PCR: NEGATIVE

## 2020-08-01 MED ORDER — LACTATED RINGERS IV SOLN: INTRAVENOUS | Status: DC

## 2020-08-01 MED ORDER — SODIUM CHLORIDE 0.9 % IV SOLN
1.0000 g | Freq: Once | INTRAVENOUS | Status: AC
  Administered 2020-08-01: 1 g via INTRAVENOUS
  Filled 2020-08-01: qty 1000

## 2020-08-01 MED ORDER — SODIUM CHLORIDE 0.9 % IV SOLN
1.0000 g | Freq: Four times a day (QID) | INTRAVENOUS | Status: AC
  Administered 2020-08-01 – 2020-08-03 (×8): 1 g via INTRAVENOUS
  Filled 2020-08-01 (×11): qty 1000

## 2020-08-01 MED ORDER — IPRATROPIUM BROMIDE 0.02 % IN SOLN
2.5000 mL | Freq: Three times a day (TID) | RESPIRATORY_TRACT | Status: DC
  Administered 2020-08-01: 0.5 mg via RESPIRATORY_TRACT
  Filled 2020-08-01: qty 2.5

## 2020-08-01 MED ORDER — INSULIN GLARGINE 100 UNIT/ML ~~LOC~~ SOLN
15.0000 [IU] | Freq: Once | SUBCUTANEOUS | Status: AC
  Administered 2020-08-01: 15 [IU] via SUBCUTANEOUS
  Filled 2020-08-01: qty 0.15

## 2020-08-01 MED ORDER — INSULIN GLARGINE 100 UNIT/ML ~~LOC~~ SOLN
15.0000 [IU] | Freq: Every day | SUBCUTANEOUS | Status: DC
  Administered 2020-08-01: 15 [IU] via SUBCUTANEOUS
  Filled 2020-08-01 (×2): qty 0.15

## 2020-08-01 NOTE — Progress Notes (Signed)
Patient to floor from ED at this time. Patient resting in bed, denies having any complaints. Patient bathed, telemetry monitoring applied, vitals obtained. Insulin gtt currently infusing at this time, dose titrated per Endotool (see mar). Patient oriented to call light, and instructed on fall precautions, she verbalizes understanding. Call light within reach, will continue to monitor.

## 2020-08-01 NOTE — Evaluation (Signed)
Physical Therapy Evaluation Patient Details Name: Casey Humphrey MRN: [id number] DOB: 1945-09-13 Today's Date: 08/01/2020   History of Present Illness  75yo female admitted 5/16 with AMS, hallucinations and R facial droop. CT/MRI negative for acute CVA. Admitted with acute metabolic and infectious encephalopathy, UTI. PMH anginal pain, Bleeders disease, CHF, CAD, DM with neuropathy, HTN, L knee arthroscopy, MI, CVA  Clinical Impression   Patient received in bed, very flat affect but cooperative; unsure of the accuracy of her answers to PLOF questions given A&Ox2 so will eventually need to clarify with family. Able to get to EOB with one person, however unable to clear buttocks from bed even with multiple attempts and totalAx1. Really did not initiate or try to physically stand. Left in bed in chair position with all needs met, bed alarm active and nursing staff aware of patient status. Would really benefit from SNF, however if patient/family refuse she will require HHPT, 24/7A, and equipment as below.     Follow Up Recommendations SNF;Supervision/Assistance - 24 hour;Other (comment) (will require HHPT and 24/7A if patient/family decline SNF)    Equipment Recommendations  Rolling walker with 5" wheels;3in1 (PT);Wheelchair (measurements PT);Wheelchair cushion (measurements PT);Hospital bed;Other (comment) (hoyer lift and pads)    Recommendations for Other Services       Precautions / Restrictions Precautions Precautions: Fall Restrictions Weight Bearing Restrictions: No      Mobility  Bed Mobility Overal bed mobility: Needs Assistance Bed Mobility: Rolling;Supine to Sit;Sit to Supine Rolling: Min assist   Supine to sit: Mod assist;HOB elevated Sit to supine: Mod assist   General bed mobility comments: able to roll most of the way over with just MinA to boost, and needed heavy ModA for BLE support getting into and out of bed    Transfers Overall transfer level: Needs  assistance Equipment used: Rolling walker (2 wheeled) Transfers: Sit to/from Stand Sit to Stand: Total assist         General transfer comment: made multiple attempts but unable to clear buttocks from bed with just one person. Patient with basically zero initiation  Ambulation/Gait             General Gait Details: unable  Stairs            Wheelchair Mobility    Modified Rankin (Stroke Patients Only)       Balance Overall balance assessment: Needs assistance   Sitting balance-Leahy Scale: Fair Sitting balance - Comments: initially had posterior lean requiring MinA for midline, faded to close S Postural control: Posterior lean     Standing balance comment: unable to assess                             Pertinent Vitals/Pain Pain Assessment: No/denies pain    Home Living Family/patient expects to be discharged to:: Private residence Living Arrangements: Children Available Help at Discharge: Family;Available 24 hours/day Type of Home: House Home Access: Ramped entrance     Home Layout: One level Home Equipment: Walker - 2 wheels;Wheelchair - manual;Bedside commode Additional Comments: bit of an unclear historian, will need to confirm with family when able as what pt told me was very different from performance during session; has had some almost falls recently    Prior Function Level of Independence: Needs assistance   Gait / Transfers Assistance Needed: reports she does pretty well with RW, doesn't need much help  ADL's / Homemaking Assistance Needed: can do "some" of  it, family helps with the rest        Hand Dominance   Dominant Hand: Right    Extremity/Trunk Assessment   Upper Extremity Assessment Upper Extremity Assessment: Defer to OT evaluation    Lower Extremity Assessment Lower Extremity Assessment: Generalized weakness    Cervical / Trunk Assessment Cervical / Trunk Assessment: Kyphotic  Communication    Communication: No difficulties  Cognition Arousal/Alertness: Awake/alert Behavior During Therapy: Flat affect Overall Cognitive Status: Impaired/Different from baseline Area of Impairment: Orientation;Attention;Memory;Following commands;Safety/judgement;Awareness;Problem solving                 Orientation Level: Disoriented to;Time;Situation Current Attention Level: Sustained Memory: Decreased short-term memory Following Commands: Follows one step commands consistently;Follows one step commands with increased time Safety/Judgement: Decreased awareness of safety;Decreased awareness of deficits Awareness: Intellectual Problem Solving: Slow processing;Decreased initiation;Difficulty sequencing;Requires verbal cues;Requires tactile cues General Comments: often tells me "I can't" without trying and needs lots of encouragement; unsure how accurate her recall of home/assistance levels is as what she told me was inconsistent with physical performance      General Comments General comments (skin integrity, edema, etc.): VSS on RA    Exercises     Assessment/Plan    PT Assessment Patient needs continued PT services  PT Problem List Decreased strength;Decreased cognition;Decreased knowledge of use of DME;Obesity;Decreased activity tolerance;Decreased safety awareness;Decreased balance;Decreased mobility;Decreased coordination       PT Treatment Interventions DME instruction;Balance training;Gait training;Cognitive remediation;Functional mobility training;Patient/family education;Therapeutic activities;Therapeutic exercise;Wheelchair mobility training    PT Goals (Current goals can be found in the Care Plan section)  Acute Rehab PT Goals Patient Stated Goal: go home PT Goal Formulation: With patient Time For Goal Achievement: 08/15/20 Potential to Achieve Goals: Fair    Frequency Min 2X/week   Barriers to discharge        Co-evaluation               AM-PAC PT "6  Clicks" Mobility  Outcome Measure Help needed turning from your back to your side while in a flat bed without using bedrails?: A Little Help needed moving from lying on your back to sitting on the side of a flat bed without using bedrails?: A Lot Help needed moving to and from a bed to a chair (including a wheelchair)?: Total Help needed standing up from a chair using your arms (e.g., wheelchair or bedside chair)?: Total Help needed to walk in hospital room?: Total Help needed climbing 3-5 steps with a railing? : Total 6 Click Score: 9    End of Session Equipment Utilized During Treatment: Gait belt Activity Tolerance: Patient tolerated treatment well Patient left: in bed;with call bell/phone within reach;with bed alarm set (in chair position) Nurse Communication: Mobility status;Other (comment) (IVs occluded) PT Visit Diagnosis: Unsteadiness on feet (R26.81);Difficulty in walking, not elsewhere classified (R26.2);Muscle weakness (generalized) (M62.81)    Time: 1155-2080 PT Time Calculation (min) (ACUTE ONLY): 34 min   Charges:   PT Evaluation $PT Eval Moderate Complexity: 1 Mod PT Treatments $Therapeutic Activity: 8-22 mins        Windell Norfolk, DPT, PN1   Supplemental Physical Therapist [place]    Pager [phone] Acute Rehab Office [phone]

## 2020-08-01 NOTE — TOC Initial Note (Signed)
Transition of Care [facility]) - Initial/Assessment Note    Patient Details  Name: Casey Humphrey MRN: [id number] Date of Birth: [birth year]  Transition of Care [facility] CM/SW Contact:    Benard Halsted, LCSW Phone Number: 08/01/2020, 12:46 PM  Clinical Narrative:                CSW received consult for possible home health services at time of discharge. CSW spoke with patient's sister, Butch Penny. Butch Penny reported that she is patient's caregiver (though patient lives with her daughter and daughter's boyfriend) and Butch Penny lives 5 minutes away. She stated patient recently discharged home after being at [facility] for rehab. She had to complete several appeals for patient to stay over the 20 days. Since patient will be in her out of pocket copay days, Butch Penny requests that she return home at discharge. She reported that she would like home health services to continue with [facility]. CSW checking with them to make sure they are active with patient. CSW discussed equipment needs with patient's sister and she stated they have a hospital bed, hoyer lyft, wheelchair with extender, walker, and bedside commode. CSW confirmed PCP and address. Patient has an appointment with her MD on Friday which sister hopes she can make. Patient will require PTAR for transport home.    Expected Discharge Plan: [place] Barriers to Discharge: Continued Medical Work up   Patient Goals and CMS Choice Patient states their goals for this hospitalization and ongoing recovery are:: Return home CMS Medicare.gov Compare Post Acute Care list provided to:: Patient Represenative (must comment) Choice offered to / list presented to : Sibling  Expected Discharge Plan and Services Expected Discharge Plan: [place] In-house Referral: Clinical Social Work Discharge Planning Services: CM Consult Post Acute Care Choice: Resumption of Svcs/PTA Provider,Home Health Living arrangements for the past 2  months: [place]: PT,OT [place]: [place] Date [place]: 08/01/20 Time [place]: [place] Representative spoke with at [place]: Malachy Mood  Prior Living Arrangements/Services Living arrangements for the past 2 months: [place] Lives with:: Adult Children Patient language and need for interpreter reviewed:: Yes Do you feel safe going back to the place where you live?: Yes      Need for Family Participation in Patient Care: Yes (Comment) Care giver support system in place?: Yes (comment) Current home services: DME (hospital bed, lift, wc, bsc) Criminal Activity/Legal Involvement Pertinent to Current Situation/Hospitalization: No - Comment as needed  Activities of Daily Living Home Assistive Devices/Equipment: Dentures (specify type) ADL Screening (condition at time of admission) Patient's cognitive ability adequate to safely complete daily activities?: Yes Is the patient deaf or have difficulty hearing?: No Does the patient have difficulty seeing, even when wearing glasses/contacts?: No Does the patient have difficulty concentrating, remembering, or making decisions?: Yes Patient able to express need for assistance with ADLs?: Yes Does the patient have difficulty dressing or bathing?: Yes Independently performs ADLs?: No Does the patient have difficulty walking or climbing stairs?: Yes Weakness of Legs: Both Weakness of Arms/Hands: Both  Permission Sought/Granted Permission sought to share information with : Facility Contact Representative,Family Supports Permission granted to share information with : Yes, Verbal Permission Granted  Share Information with NAME: Butch Penny  Permission granted to share info w AGENCY:  HH  Permission granted to share info w Relationship: Sister/caregiver  Permission granted to share info w Contact Information: [phone]  Emotional  Assessment Appearance:: Appears stated age Attitude/Demeanor/Rapport: Unable to Assess Affect (typically observed): Unable to Assess Orientation: : Oriented to Self,Oriented to Place,Oriented to  Time Alcohol / Substance Use: Not Applicable Psych Involvement: No (comment)  Admission diagnosis:  Acute urinary tract infection [N39.0] Acute encephalopathy [G93.40] AKI (acute kidney injury) ([place]) [N17.9] Hyperosmolar hyperglycemic state (HHS) ([place]) [E11.00, E11.65] Patient Active Problem List   Diagnosis Date Noted  . Acute metabolic and infectious encephalopathy 07/31/2020  . Acute symptomatic hyperglycemia 07/31/2020  . UTI (urinary tract infection) 07/31/2020  . Asthma 01/28/2018  . Cold extremities 11/04/2017  . Hypertensive heart disease with heart failure ([place]) 10/14/2017  . Mild CAD 10/14/2017  . Type 2 diabetes mellitus ([place]) 10/14/2017  . Chest pain 10/14/2017  . Chronic diastolic heart failure ([place]) 07/25/2015  . Hyperlipidemia 07/25/2015  . Pancreatic mass 04/08/2013   PCP:  Angelina Sheriff, MD Pharmacy:   [facility] [place], [place] - [address] AT [address] [place] [address] Phone: [phone] Fax: [phone]     Social Determinants of Health (SDOH) Interventions    Readmission Risk Interventions No flowsheet data found.

## 2020-08-01 NOTE — Progress Notes (Signed)
Inpatient Diabetes Program Recommendations  AACE/ADA: New Consensus Statement on Inpatient Glycemic Control (2015)  Target Ranges:  Prepandial:   less than 140 mg/dL      Peak postprandial:   less than 180 mg/dL (1-2 hours)      Critically ill patients:  140 - 180 mg/dL   Lab Results  Component Value Date   GLUCAP 140 (H) 08/01/2020   HGBA1C 10.2 (H) 07/31/2020    Review of Glycemic Control  Diabetes history: No prior hx DM Current orders for Inpatient glycemic control: IV insulin  Inpatient Diabetes Program Recommendations:   When ready to transition from IV insulin: give basal insulin 2 hrs. Prior to D/C of insulin drip and cover CBG with Novolog correction when IV insulin discontinued. -Lantus 10 units daily (0.15 units/kg x 75.3 kg=11) -Novolog 0-9 units tid + 0-5 units hs  Thank you, Bethena Roys E. Eljay Lave, RN, MSN, CDE  Diabetes Coordinator Inpatient Glycemic Control Team Team Pager [phone] (8am-5pm) 08/01/2020 9:57 AM

## 2020-08-01 NOTE — Consult Note (Signed)
WOC Nurse Consult Note: Patient receiving care in [facility] [phone]. Assisted with turning patient by NT. Reason for Consult: wounds to sacrum and right foot Wound type: stage 2 PI to coccyx; DTPI to tip of right great toe Pressure Injury POA: Yes Measurement: coccyx wound measures 1 cm x 0.6 cm; right great toe tip wound measures 0.5 cm x 1 cm Wound bed: coccyx is pink; toe tip is dried and maroon Drainage (amount, consistency, odor)  none Periwound: intact Dressing procedure/placement/frequency: Lift sacral foam dressing and assess coccyx wound each shift. Reconsult the [place] team if the wound becomes ANY color other than pink. I have also entered orders for turning the patient and floating the heels. Monitor the wound area(s) for worsening of condition such as: Signs/symptoms of infection,  Increase in size,  Development of or worsening of odor, Development of pain, or increased pain at the affected locations.  Notify the medical team if any of these develop.  Thank you for the consult. [place] nurse will not follow at this time.  Please re-consult the [place] team if needed.  Val Riles, RN, MSN, CWOCN, CNS-BC, pager [phone]

## 2020-08-01 NOTE — Progress Notes (Signed)
Patient has had no urine output since arrival to unit and no urine output documented while patient in ED. Bladder scan reveals 67 ml of urine in bladder at this time. Upon auscultation of patient's lung sounds, coarse crackles heard to bilateral bases. Patient resting comfortably in bed, oxygen saturation 98% on room air. Patient denies having any complaints at this time. Dr. Shon Baton notified of findings, telephone orders received to continue LR infusion at 125/hr and continue to monitor patient for any additional changes/signs of distress. Will continue to monitor.

## 2020-08-01 NOTE — Progress Notes (Signed)
  Speech Language Pathology Treatment: Dysphagia  Patient Details Name: Casey Humphrey MRN: [id number] DOB: 1945/09/17 Today's Date: 08/01/2020 Time: 1245-8099 SLP Time Calculation (min) (ACUTE ONLY): 9 min  Assessment / Plan / Recommendation Clinical Impression  Pt seen with regular snack and thin liquids at bedside. Per nursing pt with excellent tolerance of current mechanical soft and thin liquid diet. Prolonged mastication noted with regular solids and mild oral residuals. No overt s/sx of aspiration exhibited with any PO. Dysphagia 3 (mechanical soft) and thin liquids remains most appropriate diet. No further ST needs identified.    HPI HPI: Patient is a 75 y.o. female with PMH: DM-2, HTN, vitamin-D deficiency, chronic diastolic heart failure, CAD, prior CVA with left LE deficit, GERD, osteoarthritis, hypothyroidism who presented to ED with AMS which include episodes of hallucinations. CT  head did not show any intracranial abnormalities, CXR did not reveal any pulmonary infiltrates. Urinalysis showed pyuria with many bacteria, glucose greater than 500.  MRI of brain showed complete opacification of an expanded right sphenoid sinus with findings suggestive of fungal sinusitis.      SLP Plan  Discharge SLP treatment due to (comment) (goals met)       Recommendations  Diet recommendations: Dysphagia 3 (mechanical soft);Thin liquid Liquids provided via: Cup;Straw Medication Administration: Whole meds with liquid Supervision: Patient able to self feed;Full supervision/cueing for compensatory strategies Compensations: Slow rate;Small sips/bites;Minimize environmental distractions Postural Changes and/or Swallow Maneuvers: Upright 30-60 min after meal;Seated upright 90 degrees                Oral Care Recommendations: Oral care BID Follow up Recommendations: None SLP Visit Diagnosis: Dysphagia, unspecified (R13.10) Plan: Discharge SLP treatment due to (comment) (goals  met)       GO                Hayden Rasmussen MA, CCC-SLP Acute Rehabilitation Services   08/01/2020, 11:40 AM

## 2020-08-01 NOTE — Progress Notes (Signed)
HD#1 Subjective:  Overnight Events: No urine output overnight, bladder scan with about 60 mL.   Patient examined at bedside. States she is feeling better this morning and left knee pain has resolved. Denies difficulty with breathing or feelings of weakness. Not seeing any family members in the room.  Objective:  Vital signs in last 24 hours: Vitals:   07/31/20 2045 07/31/20 2212 07/31/20 2334 08/01/20 0340  BP: (!) 110/53 (!) 117/58 (!) 119/56 (!) 105/53  Pulse: 76 75 70 69  Resp: 18  15 18   Temp: (!) 97.5 F (36.4 C)  98.3 F (36.8 C) 97.7 F (36.5 C)  TempSrc: Oral  Oral Oral  SpO2: 98%  91% 95%  Weight: 75.3 kg     Height: 5\' 2"  (1.575 m)      Supplemental O2: Room Air SpO2: 95 %   Physical Exam:  Physical Exam Constitutional:      Comments: Chronically ill appearing  HENT:     Mouth/Throat:     Mouth: Mucous membranes are moist.     Comments: glossitis Neck:     Comments: No JVP Cardiovascular:     Rate and Rhythm: Normal rate and regular rhythm.     Pulses: Normal pulses.  Pulmonary:     Effort: Pulmonary effort is normal.     Comments: Rales of left lower and mid lung, rales of right lung base Abdominal:     General: Abdomen is flat. Bowel sounds are normal.     Palpations: Abdomen is soft.  Musculoskeletal:     Comments: Pitting edema of bilateral lower extermities  Skin:    Comments: Raised exophytic lesion of the right forehead, multiple and extensive areas of ecchymosis of the upper and lower extremities and face, diminished skin turgor of the of the upper torso and UE  Neurological:     Mental Status: She is alert.     Comments: Oriented to person, place, and situation. No tremor  Psychiatric:     Comments: No visual or auditory hallucinations, poor attention    Filed Weights   07/31/20 1001 07/31/20 2045  Weight: 70.4 kg 75.3 kg     Intake/Output Summary (Last 24 hours) at 08/01/2020 0612 Last data filed at 08/01/2020 0500 Gross per 24  hour  Intake 3250.31 ml  Output 0 ml  Net 3250.31 ml   Net IO Since Admission: 3,250.31 mL [08/01/20 0612]  Pertinent Labs: CBC Latest Ref Rng & Units 07/31/2020 07/31/2020 07/31/2020  WBC 4.0 - 10.5 K/uL - - 8.2  Hemoglobin 12.0 - 15.0 g/dL 10.5(L) 10.9(L) 10.7(L)  Hematocrit 36.0 - 46.0 % 31.0(L) 32.0(L) 32.7(L)  Platelets 150 - 400 K/uL - - 137(L)    CMP Latest Ref Rng & Units 08/01/2020 07/31/2020 07/31/2020  Glucose 70 - 99 mg/dL 183(H) 240(H) 417(H)  BUN 8 - 23 mg/dL 19 22 23   Creatinine 0.44 - 1.00 mg/dL 1.12(H) 1.17(H) 1.32(H)  Sodium 135 - 145 mmol/L 135 135 134(L)  Potassium 3.5 - 5.1 mmol/L 4.5 3.9 3.6  Chloride 98 - 111 mmol/L 101 99 99  CO2 22 - 32 mmol/L 25 27 28   Calcium 8.9 - 10.3 mg/dL 10.5(H) 10.3 10.6(H)  Total Protein 6.5 - 8.1 g/dL - - -  Total Bilirubin 0.3 - 1.2 mg/dL - - -  Alkaline Phos 38 - 126 U/L - - -  AST 15 - 41 U/L - - -  ALT 0 - 44 U/L - - -    Imaging: DG Chest  2 View  Result Date: 07/31/2020 CLINICAL DATA:  Chest pain, altered mental status EXAM: CHEST - 2 VIEW COMPARISON:  09/23/2019 FINDINGS: Heart is normal size. Left base atelectasis or infiltrate. Right lung clear. No effusions or acute bony abnormality. IMPRESSION: Left base atelectasis or infiltrate. Electronically Signed   By: Rolm Baptise M.D.   On: 07/31/2020 10:52   MR BRAIN WO CONTRAST IMPRESSION: 1. No evidence of acute intracranial abnormality. Specifically, no acute infarct. 2. Complete opacification of an expanded right sphenoid sinus with findings suggestive of fungal sinusitis, as detailed above. Electronically Signed   By: Margaretha Sheffield MD   On: 07/31/2020 13:29   US RENAL  IMPRESSION: 1. Negative for hydronephrosis. 2. Poorly visible left kidney secondary to bowel gas and mobility. Questionable hypoechoic lesion at the mid left kidney for which sonographic follow-up is recommended. Electronically Signed   By: Donavan Foil M.D.   On: 07/31/2020 18:48   CT HEAD CODE STROKE  WO CONTRAST IMPRESSION: 1. No acute intracranial abnormality 2. ASPECTS is 10 3. Extensive expansile opacification and hyperdensity throughout the right sphenoid sinus. This may be due to sinusitis with superimposed fungal infection versus mass lesion. ENT consultation recommended. 4. Code stroke imaging results were communicated on 07/31/2020 at 9:31 am to provider Rory Percy via text page Electronically Signed   By: Franchot Gallo M.D.   On: 07/31/2020 09:32    Assessment/Plan:   Principal Problem:   Acute metabolic and infectious encephalopathy Active Problems:   Acute symptomatic hyperglycemia   UTI (urinary tract infection)  Patient Summary: Ms. Kadow is a 75 year old woman with medical history significant for hypertension, type 2 diabetes mellitus, vitamin D deficiency, chronic diastolic heart failure, CAD, prior CVA with left lower extremity deficit, GERD, osteoarthritis, hypothyroidism  here for management of acute metabolic encephalopathy and infectious secondary to acute hyperglycemia and urinary tract infection  #Acute metabolic and infectious encephalopathy #hyperglycemic hyperosmolar syndrome Glucose improved this morning. No acute intercranial process found on MRI or CT head. She is alert and oriented to person, place, and time. No hallucinations on exam. A1c of 10.2. TSH 0.28, B12 negative. Likely encephalopathy in the setting hyperglycemia and UTI, however Lewy body dementia cannot be ruled out. Mental status was improving  -Lantus 15 units daily -LR 50 ml/hr -follow up RPR -delirium precautions   #Urinary tract infection UA with glucose, small amount to hGb, large leukocytes and many bacteria  -Follow-up urine culture -Continue treatment with ceftriaxone  #AKI Fena indicationpre-renal etiology likely dehydration in setting of HHS. Cr now 1.12 this morning. Noted to have decreased urine output since admission.  -- Continue to monitor  -- I/Os  #Hypertension BP stable  currently -Continue propanolol 10 mg twice daily -Hold home spironolactone   #Chronic diastolic heart failure Appears dehydrated, no JVD -Hold Torsemide 20 mg twice daily daily and Lasix 40 mg as needed  #Hypothyroidism TSH 0.28 - Continue home Synthroid 100 mcg  #Microcytic anemia #Chronic thrombocytopenia Hemoglobin stable. Will discuss bleeding disorder with family -Follow daily CBC -Follow blood smear  #Cognitive impairment Suspecting either Alzheimer's dementia versus Parkinson's disease.  Sister reports that for the past couple months, she has had gradual cognitive impairment with associated hallucination and fidgeting.  Given ongoing acute hypoglycemia and infection, she would require thorough mental status assessment after treatment of her hyperglycemia and urinary tract infection.  #Right sphenoid sinus opacification No eye pain or bony invasion. Will need outpatient follow up.   # Chronic malnourishment  Albumin of 2.3  on admission. -Consult RD  Dispo: Anticipated discharge to Pending in further medical workup  Iona Beard, MD 08/01/2020, 6:12 AM Pager: [phone]  Please contact the on call pager after 5 pm and on weekends at [phone].

## 2020-08-02 LAB — CBC
HCT: 30.5 % — ABNORMAL LOW (ref 36.0–46.0)
Hemoglobin: 10.1 g/dL — ABNORMAL LOW (ref 12.0–15.0)
MCH: 36.5 pg — ABNORMAL HIGH (ref 26.0–34.0)
MCHC: 33.1 g/dL (ref 30.0–36.0)
MCV: 110.1 fL — ABNORMAL HIGH (ref 80.0–100.0)
Platelets: 120 10*3/uL — ABNORMAL LOW (ref 150–400)
RBC: 2.77 MIL/uL — ABNORMAL LOW (ref 3.87–5.11)
RDW: 14.7 % (ref 11.5–15.5)
WBC: 8.3 10*3/uL (ref 4.0–10.5)
nRBC: 0 % (ref 0.0–0.2)

## 2020-08-02 LAB — URINE CULTURE: Culture: 70000 — AB

## 2020-08-02 LAB — COMPREHENSIVE METABOLIC PANEL
ALT: 33 U/L (ref 0–44)
AST: 41 U/L (ref 15–41)
Albumin: 1.9 g/dL — ABNORMAL LOW (ref 3.5–5.0)
Alkaline Phosphatase: 88 U/L (ref 38–126)
Anion gap: 5 (ref 5–15)
BUN: 21 mg/dL (ref 8–23)
CO2: 26 mmol/L (ref 22–32)
Calcium: 9.7 mg/dL (ref 8.9–10.3)
Chloride: 101 mmol/L (ref 98–111)
Creatinine, Ser: 1.2 mg/dL — ABNORMAL HIGH (ref 0.44–1.00)
GFR, Estimated: 47 mL/min — ABNORMAL LOW (ref >60)
Glucose, Bld: 250 mg/dL — ABNORMAL HIGH (ref 70–99)
Potassium: 4.1 mmol/L (ref 3.5–5.1)
Sodium: 132 mmol/L — ABNORMAL LOW (ref 135–145)
Total Bilirubin: 1.2 mg/dL (ref 0.3–1.2)
Total Protein: 5.3 g/dL — ABNORMAL LOW (ref 6.5–8.1)

## 2020-08-02 LAB — RPR: RPR Ser Ql: NONREACTIVE

## 2020-08-02 LAB — GLUCOSE, CAPILLARY
Glucose-Capillary: 208 mg/dL — ABNORMAL HIGH (ref 70–99)
Glucose-Capillary: 177 mg/dL — ABNORMAL HIGH (ref 70–99)
Glucose-Capillary: 261 mg/dL — ABNORMAL HIGH (ref 70–99)

## 2020-08-02 LAB — PLATELET COUNT: Platelets: 105 10*3/uL — ABNORMAL LOW (ref 150–400)

## 2020-08-02 MED ORDER — ENSURE MAX PROTEIN PO LIQD
11.0000 [oz_av] | Freq: Every day | ORAL | Status: DC
  Administered 2020-08-03 – 2020-08-07 (×5): 11 [oz_av] via ORAL
  Filled 2020-08-02 (×8): qty 330

## 2020-08-02 MED ORDER — INSULIN STARTER KIT- PEN NEEDLES (ENGLISH)
1.0000 | Freq: Once | Status: AC
  Administered 2020-08-02: 1
  Filled 2020-08-02: qty 1

## 2020-08-02 MED ORDER — INSULIN ASPART 100 UNIT/ML IJ SOLN
0.0000 [IU] | Freq: Three times a day (TID) | INTRAMUSCULAR | Status: DC
  Administered 2020-08-02: 8 [IU] via SUBCUTANEOUS
  Administered 2020-08-02: 3 [IU] via SUBCUTANEOUS
  Administered 2020-08-02: 8 [IU] via SUBCUTANEOUS
  Administered 2020-08-03: 3 [IU] via SUBCUTANEOUS
  Administered 2020-08-03: 5 [IU] via SUBCUTANEOUS
  Administered 2020-08-03: 3 [IU] via SUBCUTANEOUS
  Administered 2020-08-04: 5 [IU] via SUBCUTANEOUS
  Administered 2020-08-04: 2 [IU] via SUBCUTANEOUS
  Administered 2020-08-04 – 2020-08-05 (×2): 5 [IU] via SUBCUTANEOUS
  Administered 2020-08-05: 8 [IU] via SUBCUTANEOUS
  Administered 2020-08-05: 5 [IU] via SUBCUTANEOUS
  Administered 2020-08-06: 3 [IU] via SUBCUTANEOUS
  Administered 2020-08-06: 5 [IU] via SUBCUTANEOUS
  Administered 2020-08-06: 8 [IU] via SUBCUTANEOUS
  Administered 2020-08-07 (×2): 5 [IU] via SUBCUTANEOUS
  Administered 2020-08-07: 8 [IU] via SUBCUTANEOUS
  Administered 2020-08-08 (×3): 3 [IU] via SUBCUTANEOUS

## 2020-08-02 MED ORDER — ADULT MULTIVITAMIN W/MINERALS CH
1.0000 | ORAL_TABLET | Freq: Every day | ORAL | Status: DC
  Administered 2020-08-02 – 2020-08-08 (×7): 1 via ORAL
  Filled 2020-08-02 (×7): qty 1

## 2020-08-02 MED ORDER — GLUCERNA SHAKE PO LIQD
237.0000 mL | Freq: Two times a day (BID) | ORAL | Status: DC
  Administered 2020-08-02 – 2020-08-08 (×11): 237 mL via ORAL

## 2020-08-02 MED ORDER — INSULIN GLARGINE 100 UNIT/ML ~~LOC~~ SOLN
5.0000 [IU] | Freq: Once | SUBCUTANEOUS | Status: AC
  Administered 2020-08-02: 5 [IU] via SUBCUTANEOUS
  Filled 2020-08-02: qty 0.05

## 2020-08-02 MED ORDER — INSULIN GLARGINE 100 UNIT/ML ~~LOC~~ SOLN
20.0000 [IU] | Freq: Every day | SUBCUTANEOUS | Status: DC
  Administered 2020-08-02: 20 [IU] via SUBCUTANEOUS
  Filled 2020-08-02 (×2): qty 0.2

## 2020-08-02 NOTE — Progress Notes (Addendum)
Subjective:  Patient is doing well this morning because she is not hurting all over. She is tired. She states she has not seen her deceased parents or had other hallucinations in one week. Upon asking where she was, she began to state that her mother "died 21 years ago," but upon repeated questioning, she could state who she was, where she was, and what month and day it was. She also endorses burning with urination but no abdominal pain.  Objective:  Vital signs in last 24 hours: Vitals:   08/01/20 2359 08/02/20 0419 08/02/20 0815 08/02/20 1158  BP: (!) 110/45 (!) 120/55 (!) 121/58 (!) 122/50  Pulse: 94 83 85 82  Resp: 16 12 16 14   Temp: 98.2 F (36.8 C) 98.2 F (36.8 C) 98.6 F (37 C) 98.1 F (36.7 C)  TempSrc: Oral Oral Oral Oral  SpO2: 95% 92% 96% 97%  Weight:      Height:       Weight change:   Intake/Output Summary (Last 24 hours) at 08/02/2020 1652 Last data filed at 08/02/2020 0655 Gross per 24 hour  Intake --  Output 500 ml  Net -500 ml   Physical Exam Constitutional:      Appearance: She is ill-appearing.     Comments: Patient is slumped to left side of bed. Her eyes are heavy.  HENT:     Head:     Comments: Palate and oropharynx pink and non-erythematous. Cardiovascular:     Rate and Rhythm: Normal rate and regular rhythm.     Heart sounds: Normal heart sounds.  Pulmonary:     Effort: Pulmonary effort is normal.     Comments: Normal breath sounds anteriorly. Abdominal:     General: Abdomen is flat. Bowel sounds are normal.     Palpations: Abdomen is soft.     Tenderness: There is no abdominal tenderness.  Musculoskeletal:     Right lower leg: 1+ Pitting Edema present.     Left lower leg: 1+ Pitting Edema present.  Feet:     Comments: Large bulla on R medial calcaneous filled with blood and leaking on bed. Ecchymoses present along distal metatarsals. Nonhealing blood blister present on tip of R great toe. Areas tender to palpation. Skin:    General:  Skin is warm.     Comments: Large, crusted lesion on R forehead. Multiple ecchymoses on bilateral upper and lower extremities, worse so on upper extremities. Decreased skin turgor of bilateral upper extremities.  Neurological:     Mental Status: She is oriented to person, place, and time.     Cranial Nerves: Cranial nerves are intact.     Sensory: Sensation is intact.     Comments: Eyes show no accommodation. Otherwise CN intact. She repetitively scratches the same spot on her left arm.  Psychiatric:     Comments: Patient drifted off into different topic once but upon further questioning, attention returned. Flat affect. Mumbling speech. Cooperative.    Assessment/Plan:  Principal Problem:   Acute metabolic and infectious encephalopathy Active Problems:   Acute symptomatic hyperglycemia   UTI (urinary tract infection)  Patient Summary: Casey Humphrey a 75 y.o. woman with a history of HTN, E4MP, chronic diastolic HF, CAD, and CVA on hospital day 2 for management of acute metabolic and infectious encephalopathy secondary to acute hyperglycemia and urinary tract infection  1. Acute metabolic and infectious encephalopathy Patient is ill-appearing and very tired. She has not experienced hallucinations today. She is alert and oriented to person,  place, and time; however, she did take one instance of repeated questioning to state where she was. She is continually scratching her left arm with her finger repetitively. Blood glucose today ranged from 177 to 261. RPR yesterday was non-reactive. PT, OT, SLP signed off and recommend SNF placement or 24-hour supervision. However, insurance will not cover SNF so wanting to maximize home health services. -Lantus 20 units daily, novolog ssi for better glucose control -Continue following TOC for discharge with home health services  2. Urinary tract infection Patient endorses burning with urination. She denies any suprapubic pain. Was receiving IV  ceftriaxone 1g, but urine cultures demonstrates E. Faecalis; antibiotics switched to IV ampicillin 1g due to sensitivities. -Continue IV ampicillin 1g  3. Acute kidney injury Patient's Cr today 1.20, stable from 1.12 yesterday and 1.17 two days ago. -Discontinue LR since reduced need -Monitor CMP  4. Hypertension BP stable today at 122/50. -Continue propanolol 10 mg twice daily -Continue to hold home spironolactone   5. Chronic diastolic heart failure Decreased skin turgor of upper extremities appreciated bilaterally. Lower extremity edema also appreciated bilaterally. -Continue to hold torsemide 20 mg twice daily  6. Macrocytic anemia with chronic thrombocytopenia Hemoglobin stable at 10.1 this morning with MCV 110.1. -Continue to monitor CBC  7. Chronic malnourishment  RD consulted and recommended Glucerna shake po BID, Ensure Max po daily, and MVI with minerals daily. -Ordered Glucerna, Ensure, and MVI to address malnourishment   LOS: 2 days   Casey Humphrey, Medical Student 08/02/2020, 4:52 PM Pager: [phone] After 5pm on weekdays and 1pm on weekends: On Call pager [phone]

## 2020-08-02 NOTE — Evaluation (Signed)
Occupational Therapy Evaluation Patient Details Name: Casey Humphrey MRN: [id number] DOB: 12-22-1945 Today's Date: 08/02/2020    History of Present Illness 75yo female admitted 5/16 with AMS, hallucinations and R facial droop. CT/MRI negative for acute CVA. Admitted with acute metabolic and infectious encephalopathy, UTI. PMH anginal pain, Bleeders disease, CHF, CAD, DM with neuropathy, HTN, L knee arthroscopy, MI, CVA   Clinical Impression   Pt admitted with the above diagnoses and presents with below problem list. Pt will benefit from continued acute OT to address the below listed deficits and maximize independence with basic ADLs. PTA pt was needing assist with most ADLs, able to propel herself a little bit once in w/c per sister's report, working towards pivoting goal with therapies but making slow progress. Pt currently needs max - total A with most ADLs. She was able to static sit at min guard level a few minutes (helpful for UB ADLs), unable to clear hips/buttocks from mattress when attempting to stand with +2 assist. Plan to use Stedy next session to see if pulling up on Stedy bar yields more hip/buttocks clearance. For now, she needs a [employer] fast streamer for transfers OOB.      Follow Up Recommendations  SNF;Supervision/Assistance - 24 hour- Insurance will not cover this so will need to maximize [facility] services to d/c home    Equipment Recommendations  Other (comment) (if d/c home: hospital bed)    Recommendations for Other Services       Precautions / Restrictions Precautions Precautions: Fall Precaution Comments: at risk for skin break down. Sister reports redness on heels and an area on her low back. nursing aware. Required Braces or Orthoses: Other Brace Other Brace: placed an order for B Prevalon boots Restrictions Weight Bearing Restrictions: No      Mobility Bed Mobility Overal bed mobility: Needs Assistance Bed Mobility: Supine to Sit;Sit to Supine;Rolling Rolling:  Mod assist   Supine to sit: Mod assist;HOB elevated;+2 for safety/equipment Sit to supine: Mod assist;+2 for safety/equipment   General bed mobility comments: Came to EOB position on pt's right side. assist to unweight LLE and facilitate pivoting L hip towards EOB position, utilized bed pad. Assist to advance BLE up onto bed at end of session, +2 for safety for descent of trunk. Mod A to roll either way.    Transfers Overall transfer level: Needs assistance Equipment used: 2 person hand held assist Transfers: Sit to/from Stand Sit to Stand: Max assist;+2 physical assistance;+2 safety/equipment         General transfer comment: Utilized momentum and counting to 3. Pt initiated and able to complete trunk flexion with assist. Able to unweight buttocks about 25%. Attempted 1x from EOB. May do better with pulling up on Stedy frame,    Balance Overall balance assessment: Needs assistance Sitting-balance support: Feet supported Sitting balance-Leahy Scale: Fair Sitting balance - Comments: able to static sit with up to BUE support at min guard level.       Standing balance comment: unable to assess                           ADL either performed or assessed with clinical judgement   ADL Overall ADL's : Needs assistance/impaired Eating/Feeding: Moderate assistance;Bed level;Sitting   Grooming: Maximal assistance;Bed level;Sitting   Upper Body Bathing: Maximal assistance;Sitting Upper Body Bathing Details (indicate cue type and reason): able to static sit EOB with no physical assist. Assist for bathing task Lower Body Bathing:  Total assistance;Bed level   Upper Body Dressing : Maximal assistance;Sitting Upper Body Dressing Details (indicate cue type and reason): able to static sit EOB with no physical assist. Assist for bathing task Lower Body Dressing: Total assistance;Bed level                 General ADL Comments: Pt able to static sit EOB a few minutes with  single to bilateral UE support, no physical assist. Work on unweighting buttocks as precursor to pivoting/standing/use of Stedy.     Vision Baseline Vision/History: Wears glasses Wears Glasses: At all times       Perception     Praxis      Pertinent Vitals/Pain Pain Assessment: No/denies pain     Hand Dominance Right   Extremity/Trunk Assessment Upper Extremity Assessment Upper Extremity Assessment: Generalized weakness;LUE deficits/detail;Difficult to assess due to impaired cognition LUE Deficits / Details: family reports some residual weakness from prior CVA. unable to fully assess d/t impaired cognition..   Lower Extremity Assessment Lower Extremity Assessment: Defer to PT evaluation;LLE deficits/detail LLE Deficits / Details: family reports residual weakness from prior CVA. Also per family h/o OA which a couple of years ago caused her hip to fracture leading to a fall; surgically repaired but complicated and prolonged recovery.   Cervical / Trunk Assessment Cervical / Trunk Assessment: Kyphotic   Communication Communication Communication: No difficulties   Cognition Arousal/Alertness: Awake/alert Behavior During Therapy: Flat affect Overall Cognitive Status: Impaired/Different from baseline Area of Impairment: Orientation;Attention;Memory;Following commands;Safety/judgement;Awareness;Problem solving                 Orientation Level: Disoriented to;Time;Situation Current Attention Level: Sustained Memory: Decreased short-term memory Following Commands: Follows one step commands consistently;Follows one step commands with increased time Safety/Judgement: Decreased awareness of safety;Decreased awareness of deficits Awareness: Intellectual Problem Solving: Slow processing;Decreased initiation;Difficulty sequencing;Requires verbal cues;Requires tactile cues General Comments: Minimal verbalizations, two-three word responses.   General Comments  Younger sister and  daughter present throughout session.    Exercises     Shoulder Instructions      Home Living Family/patient expects to be discharged to:: Private residence Living Arrangements: Children Available Help at Discharge: Family;Available 24 hours/day Type of Home: House Home Access: Ramped entrance     Home Layout: One level     Bathroom Shower/Tub: Teacher, early years/pre: Standard     Home Equipment: Environmental consultant - 2 wheels;Wheelchair - [employer];Other (comment) Harrel Lemon lift)   Additional Comments: Daughter and sister present and able to provide PLOF and home setup data. Pt lives with daughter who is unable to physically assist pt. Pt's younger sister lives close by and comes over during the day to help out as needed.      Prior Functioning/Environment Level of Independence: Needs assistance  Gait / Transfers Assistance Needed: has been needed increased assist at least in the past couple of months. Sister's therapy goal is for pt to be able to pivot to/from [facility] ADL's / Homemaking Assistance Needed: needs assist with ADLs            OT Problem List: Decreased strength;Decreased activity tolerance;Impaired balance (sitting and/or standing);Decreased cognition;Decreased knowledge of use of DME or AE;Decreased knowledge of precautions;Obesity;Impaired UE functional use;Pain      OT Treatment/Interventions: Self-care/ADL training;Therapeutic exercise;Energy conservation;DME and/or AE instruction;Therapeutic activities;Cognitive remediation/compensation;Patient/family education;Balance training    OT Goals(Current goals can be found in the care plan section) Acute Rehab OT Goals Patient Stated Goal: Family: SNF for continued rehab. Pt:  home. OT Goal Formulation: With patient/family Time For Goal Achievement: 08/16/20 Potential to Achieve Goals: Good  OT Frequency: Min 2X/week   Barriers to D/C: Other (comment)  Pt lives with daughter who is unable to provide  physical assistance. Pt recently d/c from [place] [facility] due to end of  insurance-approved days.       Co-evaluation              AM-PAC OT "6 Clicks" Daily Activity     Outcome Measure Help from another person eating meals?: A Lot Help from another person taking care of personal grooming?: A Lot Help from another person toileting, which includes using toliet, bedpan, or urinal?: Total Help from another person bathing (including washing, rinsing, drying)?: Total Help from another person to put on and taking off regular upper body clothing?: A Lot Help from another person to put on and taking off regular lower body clothing?: Total 6 Click Score: 9   End of Session Nurse Communication: Other (comment) (spoke [place] nurse regarding sister's report of areas of skin breakdown. Discussed getting pt on turning schedule and OT to place order for Prevalon boots)  Activity Tolerance: Patient tolerated treatment well;Patient limited by fatigue Patient left: in bed;with call bell/phone within reach;with bed alarm set;with family/visitor present  OT Visit Diagnosis: Other abnormalities of gait and mobility (R26.89);Muscle weakness (generalized) (M62.81);History of falling (Z91.81);Other symptoms and signs involving cognitive function;Pain;Hemiplegia and hemiparesis;Cognitive communication deficit (R41.841);Unsteadiness on feet (R26.81) Hemiplegia - caused by:  (prior CVA)                Time: 3570-1779 OT Time Calculation (min): 32 min Charges:  OT General Charges $OT Visit: 1 Visit OT Evaluation $OT Eval Moderate Complexity: 1 Mod OT Treatments $Self Care/Home Management : 8-22 mins  Tyrone Schimke, OT Acute Rehabilitation Services Pager: [phone] Office: [phone]   Hortencia Pilar 08/02/2020, 11:26 AM

## 2020-08-02 NOTE — Progress Notes (Signed)
   08/02/20 1540  Clinical Encounter Type  Visited With Patient and family together  Visit Type Initial  Referral From Nurse  Consult/Referral To Chaplain  This Chaplain responded to the page from the nurse. The patient's daughter and sister, Butch Penny, were at bedside. Provided AD education. Ms. Butch Penny had a different document she wanted to be notarized. Advise we can only notarize HCPOA. She stated she would review and complete. She is aware not to sign the notary page. Ms. Butch Penny stated she would return in the morning to have notarized. The patient stated she has a sister named Baker Janus and requested this chaplain to have prayer with her and family. This note was prepared by Jeanine Luz, M.Div..  For questions please contact by phone [phone].

## 2020-08-02 NOTE — Progress Notes (Signed)
Initial Nutrition Assessment  DOCUMENTATION CODES:   Obesity unspecified  INTERVENTION:   -Glucerna Shake po BID, each supplement provides 220 kcal and 10 grams of protein -Ensure Max po daily, each supplement provides 150 kcal and 30 grams of protein -MVI with minerals daily  NUTRITION DIAGNOSIS:   Increased nutrient needs related to acute illness as evidenced by estimated needs.  GOAL:   Patient will meet greater than or equal to 90% of their needs  MONITOR:   PO intake,Supplement acceptance,Labs,Weight trends,Skin,I & O's  REASON FOR ASSESSMENT:   Consult Assessment of nutrition requirement/status  ASSESSMENT:   Casey Humphrey is a 75 year old woman with medical history significant for hypertension, type 2 diabetes mellitus, vitamin D deficiency, chronic diastolic heart failure, CAD, prior CVA with left lower extremity deficit, GERD, osteoarthritis, hypothyroidism presenting to the emergency department with altered mental status.  Pt admitted with acute metabolic and infectious encephalopathy, acute HHS, and concern for CVA.   5/17- insulin drip d/c, s/p BSE- advanced to dysphagia 3 diet with thin liquids  Reviewed I/O's: +995 ml x 24 hours and +4.2 L since admission  UOP: 500 ml x 24 hours  Attempted to speak with pt and family x 2, however, unavailable at times of both visits.   No meal completion data available to assess at this time.   Reviewed wt hx; pt has experienced a 7.2% wt loss over the past year, which is not significant for time frame.   Medications reviewed and include lactated ringers infusion @ 50 ml/hr.   Lab Results  Component Value Date   HGBA1C 10.2 (H) 07/31/2020   PTA DM medications are none.   Labs reviewed: Na: 132, CBGS: 177-293 (inpatient orders for glycemic control are 0-15 units insulin aspart TID with meals and 15 units insulin glargine daily at bedtime).   Diet Order:   Diet Order            DIET DYS 3 Room service appropriate?  Yes with Assist; Fluid consistency: Thin  Diet effective now                 EDUCATION NEEDS:   No education needs have been identified at this time  Skin:  Skin Assessment: Skin Integrity Issues: Skin Integrity Issues:: DTI DTI: rt heel  Last BM:  Unknown  Height:   Ht Readings from Last 1 Encounters:  07/31/20 5\' 2"  (1.575 m)    Weight:   Wt Readings from Last 1 Encounters:  07/31/20 75.3 kg    Ideal Body Weight:  50 kg  BMI:  Body mass index is 30.36 kg/m.  Estimated Nutritional Needs:   Kcal:  6222-9798  Protein:  100-115 grams  Fluid:  > 1.7 L    Loistine Chance, RD, LDN, [place] Registered Dietitian II Certified Diabetes Care and Education Specialist Please refer to [facility] for RD and/or RD on-call/weekend/after hours pager

## 2020-08-02 NOTE — Progress Notes (Signed)
Dr. Shon Baton notified of CBG of 303 via telephone, orders recieved to continue with scheduled 20 units of lantus, will recheck at midnight. Will continue to monitor.

## 2020-08-02 NOTE — Progress Notes (Signed)
Inpatient Diabetes Program Recommendations  AACE/ADA: New Consensus Statement on Inpatient Glycemic Control (2015)  Target Ranges:  Prepandial:   less than 140 mg/dL      Peak postprandial:   less than 180 mg/dL (1-2 hours)      Critically ill patients:  140 - 180 mg/dL   Lab Results  Component Value Date   GLUCAP 177 (H) 08/02/2020   HGBA1C 10.2 (H) 07/31/2020    Review of Glycemic Control  Diabetes history: DM2 without meds Current orders for Inpatient glycemic control:Lantus 15 units + Novolog correction 0-15 units tid  Inpatient Diabetes Program Recommendations:   Spoke with sister Ova Freshwater via phone who cares for patient during the day. Patient lives with her daughter but daughter needs assistance with medication management.  Sister takes insulin and is comfortable administering insulin with pens and syringes and willing to administer insulin.  Discharge needs: Lantus insulin pen [phone]) Insulin pen needles (#[id number]) Needs prescription for meter strips Meter + strips [id number]  If Lantus is too expensive: Novolin Relion [employer] brand 70/30 insulin 11 units bid = approximately 15 units basal daily + 6.6 units meal coverage  Thank you, Bethena Roys E. Khush Pasion, RN, MSN, CDE  Diabetes Coordinator Inpatient Glycemic Control Team Team Pager [phone] (8am-5pm) 08/02/2020 1:14 PM

## 2020-08-02 NOTE — Progress Notes (Addendum)
Patient's midnight CBG 259, previous CBG 293. Dr. Shon Baton notified, orders received to medicate with additional 5 units of Lantus. Patient medicated, see MAR. Will continue to monitor.

## 2020-08-03 DIAGNOSIS — E1100 Type 2 diabetes mellitus with hyperosmolarity without nonketotic hyperglycemic-hyperosmolar coma (NKHHC): Secondary | ICD-10-CM

## 2020-08-03 LAB — BASIC METABOLIC PANEL
Anion gap: 4 — ABNORMAL LOW (ref 5–15)
BUN: 21 mg/dL (ref 8–23)
CO2: 28 mmol/L (ref 22–32)
Calcium: 9.5 mg/dL (ref 8.9–10.3)
Chloride: 100 mmol/L (ref 98–111)
Creatinine, Ser: 1.28 mg/dL — ABNORMAL HIGH (ref 0.44–1.00)
GFR, Estimated: 43 mL/min — ABNORMAL LOW (ref >60)
Glucose, Bld: 231 mg/dL — ABNORMAL HIGH (ref 70–99)
Potassium: 4 mmol/L (ref 3.5–5.1)
Sodium: 132 mmol/L — ABNORMAL LOW (ref 135–145)

## 2020-08-03 LAB — CBC
HCT: 29.6 % — ABNORMAL LOW (ref 36.0–46.0)
Hemoglobin: 9.8 g/dL — ABNORMAL LOW (ref 12.0–15.0)
MCH: 36.8 pg — ABNORMAL HIGH (ref 26.0–34.0)
MCHC: 33.1 g/dL (ref 30.0–36.0)
MCV: 111.3 fL — ABNORMAL HIGH (ref 80.0–100.0)
Platelets: UNDETERMINED 10*3/uL (ref 150–400)
RBC: 2.66 MIL/uL — ABNORMAL LOW (ref 3.87–5.11)
RDW: 15 % (ref 11.5–15.5)
WBC: 6.7 10*3/uL (ref 4.0–10.5)
nRBC: 0 % (ref 0.0–0.2)

## 2020-08-03 LAB — GLUCOSE, CAPILLARY
Glucose-Capillary: 215 mg/dL — ABNORMAL HIGH (ref 70–99)
Glucose-Capillary: 189 mg/dL — ABNORMAL HIGH (ref 70–99)
Glucose-Capillary: 233 mg/dL — ABNORMAL HIGH (ref 70–99)
Glucose-Capillary: 202 mg/dL — ABNORMAL HIGH (ref 70–99)

## 2020-08-03 MED ORDER — AMOXICILLIN 500 MG PO CAPS
500.0000 mg | ORAL_CAPSULE | Freq: Three times a day (TID) | ORAL | Status: AC
  Administered 2020-08-04 – 2020-08-06 (×9): 500 mg via ORAL
  Filled 2020-08-03 (×9): qty 1

## 2020-08-03 MED ORDER — LACTATED RINGERS IV BOLUS
500.0000 mL | Freq: Once | INTRAVENOUS | Status: AC
  Administered 2020-08-03: 500 mL via INTRAVENOUS

## 2020-08-03 MED ORDER — INSULIN GLARGINE 100 UNIT/ML ~~LOC~~ SOLN
5.0000 [IU] | Freq: Once | SUBCUTANEOUS | Status: AC
  Administered 2020-08-03: 5 [IU] via SUBCUTANEOUS
  Filled 2020-08-03: qty 0.05

## 2020-08-03 MED ORDER — INSULIN GLARGINE 100 UNIT/ML ~~LOC~~ SOLN
25.0000 [IU] | Freq: Every day | SUBCUTANEOUS | Status: DC
  Administered 2020-08-03 – 2020-08-04 (×2): 25 [IU] via SUBCUTANEOUS
  Filled 2020-08-03 (×3): qty 0.25

## 2020-08-03 NOTE — Progress Notes (Addendum)
Subjective:  Patient is doing well this morning because her feet are not hurting her as bad. She denies burning with urination or suprapubic pain. However, she does have urinary frequency today. Her external catheter was removed due to local irritation. She has been eating and drinking water and tea, and she is amenable to drinking more to keep herself hydrated.  Objective:  Vital signs in last 24 hours: Vitals:   08/03/20 0002 08/03/20 0401 08/03/20 0525 08/03/20 0700  BP: (!) 117/57 (!) 93/50 (!) 108/42 (!) 96/45  Pulse: 81 83 86 86  Resp: 17 18  16   Temp: 98 F (36.7 C) 98.1 F (36.7 C)  98.8 F (37.1 C)  TempSrc: Oral Oral  Oral  SpO2: 98% 98%  98%  Weight:      Height:       Weight change:   Intake/Output Summary (Last 24 hours) at 08/03/2020 1510 Last data filed at 08/03/2020 0857 Gross per 24 hour  Intake 120 ml  Output 850 ml  Net -730 ml   Physical Exam Constitutional:      Comments: Patient looks chronically ill. She is able to maintain conversation. She had one episode of tangential thinking.  Eyes:     General: Gaze aligned appropriately.     Comments: Eyes appeared to have "fabric-like" waving of aqueous humor bilaterally.  Cardiovascular:     Rate and Rhythm: Normal rate and regular rhythm.     Heart sounds: Normal heart sounds.  Pulmonary:     Comments: Breathing effort normal. Dry crackles in all posterior lung fields. Abdominal:     General: Abdomen is flat. Bowel sounds are normal. There is no distension.     Tenderness: There is no abdominal tenderness.  Musculoskeletal:     Right lower leg: 1+ Pitting Edema present.     Left lower leg: 1+ Pitting Edema present.     Comments: Large effusion, marked pain over L knee from prior knee surgery and recent fall. No erythema or warmth.  Feet:     Comments: Bruising over R distal metatarsals. Non-healing blood blister on R great toe. Large, bandaged, draining blood blister on R medial calcaneous. Wounds  dressings to prevent friction present on lateral calcaneous bilaterally. Areas tender to palpation bilaterally. Skin:    Capillary Refill: Capillary refill takes less than 2 seconds.     Comments: L hand cooler to touch but 2+ radial pulse. Large crusted lesion on R forehead. 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 Scattered bruising on thighs and lower legs bilaterally.  Neurological:     Mental Status: She is alert and oriented to person, place, and time.  Psychiatric:        Behavior: Behavior is cooperative.     Comments: Attention is improved. Flat affect.    Assessment/Plan:  Principal Problem:   Acute metabolic and infectious encephalopathy Active Problems:   Acute symptomatic hyperglycemia   UTI (urinary tract infection)  Patient Summary: Casey Humphrey a 75 y.o. woman with a history of HTN, Y8MV, chronic diastolic HF, CAD, and CVA on hospital day 2 for management of acute metabolic and infectious encephalopathy secondary to acute hyperglycemia and urinary tract infection  1. Acute metabolic and infectious encephalopathy Patient is chronically ill-appearing. She is alert and oriented to person, place, and time. She did show one instance of tangential thinking today, but it is likely she may just not have heard what we said. Blood glucose overnight was 303 and BP was 93/50 (MAP  61). Both were watched, and BG 215 and BP 108/42 this morning. BG remains to be uncontrolled. PT, OT saw today. PT commented on improving performance with mobility. Because of financial costs, expecting discharge home potentially tomorrow with home health services. -Increase lantus to 25 units daily and continue novolog ssi for better glucose control -Monitor mental status -Following TOC for discharge with home health services  2. Urinary tract infection Patient denies burning with urination and suprapubic pain. She does endorse increased frequency. She has been receiving IV ampicillin 1 g  but expecting discharge soon and need oral antibiotic. -Changed medication to oral amoxicillin 500 mg Q8H for 9 doses  3. Acute kidney injury Patient's Cr today 1.28, up from 1.20 yesterday and 1.12 two days ago. Patient states she has been eating and drinking well but is amenable to trying to drink more. -Bolus LR 1 L -Encouraged PO fluid intake -Monitor BMP  4. Hypertension BP stable today at 135/63. -Continue propanolol 10 mg twice daily -Continue to hold home spironolactone   5. Chronic diastolic heart failure Decreased, albeit improved, skin turgor of upper extremities bilaterally. Lower extremity edema also appreciated bilaterally. -Continue to holdtorsemide 20 mg twice daily  6. Macrocytic anemia with chronic thrombocytopenia Hemoglobin stable, but declining, this morning at 9.8 and MCV 111.3. No suspicion of acute bleed. Fluid administration likely reduced Hgb. -Continue to monitor CBC  7. Chronic malnourishment  Total protein 5.3 and albumin 1.9 yesterday. Patient has been enjoying Glucerna shake po BID, Ensure Max po daily, and MVI with minerals daily. -Continue Glucerna, Ensure, and MVI  8. Foot lesions Patient has large blood blister on right medial calcaneous that is currently draining. It is dressed. There is a non-healing blood blister on her R great toe.She also has multiple bruises along her distal metacarpals. Patient will need close attention to proper dressing and pressure management of blister at home, as it is at higher risk of infection and complication after opening. Will need close follow up with her PA. -Heel protectors -Dressings   LOS: 3 days   Mikal Plane, Medical Student 08/03/2020, 3:10 PM Pager: [phone] After 5pm on weekdays and 1pm on weekends: On Call pager [phone]

## 2020-08-03 NOTE — Progress Notes (Signed)
Physical Therapy Treatment Patient Details Name: Casey Humphrey MRN: [id number] DOB: 1946-02-17 Today's Date: 08/03/2020    History of Present Illness 75yo female admitted 5/16 with AMS, hallucinations and R facial droop. CT/MRI negative for acute CVA. Admitted with acute metabolic and infectious encephalopathy, UTI. PMH anginal pain, Bleeders disease, CHF, CAD, DM with neuropathy, HTN, L knee arthroscopy, MI, CVA    PT Comments    Patient received in bed, very pleasant, cognition better today. Still requires +2 assist for bed mobility and attempting standing transfers, but performance much better as compared to eval. Practiced sit to stand multiple times from progressively high bed, still quite weak and unable to get to full upright but was able to get about 40% of the way up to full stand. Fatigues easily. Left in bed with all needs met, bed alarm active. If going home, will need HHPT with 24/7A and equipment as below.    Follow Up Recommendations  Home health PT;Supervision/Assistance - 24 hour     Equipment Recommendations  Rolling walker with 5" wheels;3in1 (PT);Wheelchair (measurements PT);Wheelchair cushion (measurements PT);Hospital bed;Other (comment) (hoyer lift and pads)    Recommendations for Other Services       Precautions / Restrictions Precautions Precautions: Fall Precaution Comments: at risk for skin break down. Sister reports redness on heels and an area on her low back. nursing aware. Required Braces or Orthoses: Other Brace Other Brace: placed an order for B Prevalon boots Restrictions Weight Bearing Restrictions: No    Mobility  Bed Mobility Overal bed mobility: Needs Assistance Bed Mobility: Supine to Sit;Sit to Supine Rolling: Mod assist   Supine to sit: Mod assist;+2 for physical assistance;HOB elevated Sit to supine: Max assist;+2 for physical assistance   General bed mobility comments: able to initiate reaching for rail to pull up, did a  better job moving BLEs along side of bed yesterday; still needs heavy assist to manage trunk and BLEs for latter part of transfer to/from EOB    Transfers Overall transfer level: Needs assistance Equipment used: Ambulation equipment used Transfers: Sit to/from Stand Sit to Stand: Max assist;+2 physical assistance;From elevated surface         General transfer comment: Pt able to complete 3x intitating stand by pulling up on Stedy bar and with +2 max A. On third trial with assist given at ischial tuberosity pt able to clear buttocks from mattress about 75%. Also utilized momentum. fatigued quickly.  Ambulation/Gait             General Gait Details: unable   Stairs             Wheelchair Mobility    Modified Rankin (Stroke Patients Only)       Balance Overall balance assessment: Needs assistance Sitting-balance support: Feet supported Sitting balance-Leahy Scale: Fair Sitting balance - Comments: able to static sit with up to BUE support at min guard level.       Standing balance comment: unable to assess                            Cognition Arousal/Alertness: Awake/alert Behavior During Therapy: Flat affect Overall Cognitive Status: Within Functional Limits for tasks assessed Area of Impairment: Orientation;Attention;Memory;Following commands;Safety/judgement;Awareness;Problem solving                 Orientation Level: Disoriented to;Time;Situation Current Attention Level: Sustained Memory: Decreased short-term memory Following Commands: Follows one step commands consistently;Follows one step commands with  increased time Safety/Judgement: Decreased awareness of safety;Decreased awareness of deficits Awareness: Emergent Problem Solving: Requires verbal cues;Difficulty sequencing General Comments: cognition much better today- able to tell us about the TV show she had on and followed cues better, initiated better as well. Motivated to  participate.      Exercises      General Comments        Pertinent Vitals/Pain Pain Assessment: No/denies pain    Home Living                      Prior Function            PT Goals (current goals can now be found in the care plan section) Acute Rehab PT Goals Patient Stated Goal: Family: SNF for continued rehab. Pt: home. PT Goal Formulation: With patient Time For Goal Achievement: 08/15/20 Potential to Achieve Goals: Fair Progress towards PT goals: Progressing toward goals    Frequency    Min 3X/week      PT Plan Discharge plan needs to be updated;Frequency needs to be updated    Co-evaluation              AM-PAC PT "6 Clicks" Mobility   Outcome Measure  Help needed turning from your back to your side while in a flat bed without using bedrails?: A Little Help needed moving from lying on your back to sitting on the side of a flat bed without using bedrails?: A Lot Help needed moving to and from a bed to a chair (including a wheelchair)?: Total Help needed standing up from a chair using your arms (e.g., wheelchair or bedside chair)?: Total Help needed to walk in hospital room?: Total Help needed climbing 3-5 steps with a railing? : Total 6 Click Score: 9    End of Session Equipment Utilized During Treatment: Gait belt Activity Tolerance: Patient tolerated treatment well Patient left: in bed;with call bell/phone within reach;with bed alarm set Nurse Communication: Mobility status PT Visit Diagnosis: Unsteadiness on feet (R26.81);Difficulty in walking, not elsewhere classified (R26.2);Muscle weakness (generalized) (M62.81)     Time: 9935-7017 PT Time Calculation (min) (ACUTE ONLY): 28 min  Charges:  $Therapeutic Activity: 8-22 mins Co-tx with OT                     Windell Norfolk, DPT, PN1   Supplemental Physical Therapist [place]    Pager [phone] Acute Rehab Office [phone]

## 2020-08-03 NOTE — Progress Notes (Signed)
Nutrition Follow-up  DOCUMENTATION CODES:   Obesity unspecified  INTERVENTION:   -Continue Glucerna Shake po BID, each supplement provides 220 kcal and 10 grams of protein -Continue Ensure Max po daily, each supplement provides 150 kcal and 30 grams of protein -Continue MVI with minerals daily  NUTRITION DIAGNOSIS:   Increased nutrient needs related to acute illness as evidenced by estimated needs.  Ongoing  GOAL:   Patient will meet greater than or equal to 90% of their needs  Progressing   MONITOR:   PO intake,Supplement acceptance,Labs,Weight trends,Skin,I & O's  REASON FOR ASSESSMENT:   Consult Assessment of nutrition requirement/status  ASSESSMENT:   Casey Humphrey is a 75 year old woman with medical history significant for hypertension, type 2 diabetes mellitus, vitamin D deficiency, chronic diastolic heart failure, CAD, prior CVA with left lower extremity deficit, GERD, osteoarthritis, hypothyroidism presenting to the emergency department with altered mental status.  Reviewed I/O's: -850 ml x 24 hours and +3.4 L since admission  UOP: 850 ml x 24 hours  Spoke with pt at bedside, who answered some questions for this RD. She reports chronically poor appetite, usually consuming 3 meals per day (Breakfast: sausage biscuit; Lunch and Dinner: half chicken salad sandwich). Per meal completion data, pt consumed 75% of breakfast this morning. Pt reports she ate "some" of her oatmeal.   Pt shares that she does not consume nutritional supplements at home. Per MAR, pt is consuming supplements, however, pt does not recall drinking.   Reviewed wt hx; pt has experienced a 7.2% wt loss over the past year, which is not significant for time frame. Noted pt with edema, which is likely masking true weight loss as well as fat and muscle depletons.   Labs reviewed: Na: 132, CBGS: 189-261 (inpatient orders for glycemic control are 0-15 units insulin aspart TID with meals and 25 units  insulin glargine daily).   NUTRITION - FOCUSED PHYSICAL EXAM:  Flowsheet Row Most Recent Value  Orbital Region Mild depletion  Upper Arm Region Mild depletion  Thoracic and Lumbar Region No depletion  Buccal Region Mild depletion  Temple Region No depletion  Clavicle Bone Region No depletion  Clavicle and Acromion Bone Region No depletion  Scapular Bone Region No depletion  Dorsal Hand Mild depletion  Patellar Region No depletion  Anterior Thigh Region No depletion  Posterior Calf Region No depletion  Edema (RD Assessment) Moderate  Hair Reviewed  Eyes Reviewed  Mouth Reviewed  Skin Reviewed  Nails Reviewed       Diet Order:   Diet Order            DIET DYS 3 Room service appropriate? Yes with Assist; Fluid consistency: Thin  Diet effective now                 EDUCATION NEEDS:   No education needs have been identified at this time  Skin:  Skin Assessment: Skin Integrity Issues: Skin Integrity Issues:: DTI DTI: rt heel  Last BM:  08/03/20  Height:   Ht Readings from Last 1 Encounters:  07/31/20 5\' 2"  (1.575 m)    Weight:   Wt Readings from Last 1 Encounters:  07/31/20 75.3 kg    Ideal Body Weight:  50 kg  BMI:  Body mass index is 30.36 kg/m.  Estimated Nutritional Needs:   Kcal:  6073-7106  Protein:  100-115 grams  Fluid:  > 1.7 L    Loistine Chance, RD, LDN, [place] Registered Dietitian II Certified Diabetes Care and Education Specialist Please  refer to [facility] for RD and/or RD on-call/weekend/after hours pager 

## 2020-08-03 NOTE — Hospital Course (Addendum)
Disposition and follow-up:   Ms.Rorey Rene Paci Goll was discharged from [facility] in Stable condition.  At the hospital follow up visit please address:   1.  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   Diabetes -Elevated A1c of 10.2 appears recurrent -Started on metformin, please assess glucose and adjust medication as needed   Heart failure, hypertension - diuretics and propanolol held due to normotension and dehydration in setting of HHS -please evaluate volume status and restart diuretics as needed - HR in low 120s after discontinuation of betablocker, may need further evaluation if persistent for more than 2 weeks   AKI -resolved by discharge, assess PO intake   Pressure injuries - Please evaluate multiple pressure and friction related injuries for healing and infection, please assess if patient is receiving wound care and family able to assist with this   Right forehead nodule - concerning for malignancy, would benefit from biopsy   Right sphenoid sinus opacification  -asymptomatic, not on treatment, needs ENT follow up   History of left knee fracture s/p ORIF - Persistent deformity on imaging and effussion, likely in setting of severe osteoporosis. Unlikely to be improved by further surgical intervention, recommend supportive care.    2.  Labs / imaging needed at time of follow-up: CBC, BMP   3.  Pending labs/ test needing follow-up: none   Follow-up Appointments:       Follow-up Information         Angelina Sheriff, MD Follow up.   Specialty: Family Medicine Contact information: [place] [id number] [place] Hospital Course by problem list: Patient Summary: Ms. Brazeau is a 75 y.o. woman with a  history of HTN, U0AV, chronic diastolic HF, CAD, and CVA  for management of acute metabolic and infectious encephalopathy secondary to acute hyperglycemia and urinary tract infection   Acute metabolic and infectious encephalopathy Patient presenting with declining health for the past 6 months. Recently hospitalized for pneumonia before discharge to SNF in April. Recently discharged home. Has had worsening hallucination of decreased loved ones and birds. Found to have serum blood glucose greater than  900 without acidosis. Admitted for HHS. Was discontinued off metformin 5 years ago by PCP and never rechecked per family. A1c 10.2 this admission. Started on insulin infusion and fluid with improvement. Transitioned to Lantus 25 units daily. Also found to have UTI and started on antibiotics.Was noted to have a pill rolling termor intermittently as well as stereo typed movements. Concerning for underlying dementia with Lewy bodies vs other movement disorder with symptoms execrated in setting of acute hyperglycemia. Mental status improved during admission.  No further hallucinations during admission.    Urinary tract infection Patient presenting with burning with urination started empirically on ceftriaxone. Urine cultures growing E. Faecalis switched to IV ampicillin and then completed course on oral amoxacillin. Consider follow up colonoscopy as outpatient.    Acute kidney injury due to dehydration in setting of HHS Patient presented with elevated Cr of 1.68 in setting of dehydration due to HHS, continue diuretics, and decreased oral intake in setting of metabolic encephalopathy. Improved with IV fluids. Creatinine on day of discharge 0.88.   Chronic diastolic heart failure Hypertension Diuretics held in  setting of dehydration due to HHS. BPs have been soft and propranolol discontinued. HR elevated to 110-120 without symptoms. Like represents rebound from betablocker discontinuation, if still elevated in 2  week may require further workup.   Sacral wound Right calcaneal wound Patient presenting with stage 2 decubitus ulcer, and right great toe. Family reports right foot was stepped prior to admission. Health has been declining since the beginning of this year. Significant decondition and has been mostly wheelchair and bed bound in the past several months. Wound care continued during admission. Started on nutritional supplements for low albumin and poor nutritional status. Unfortunately patient has used up all her SNF days. Plan to discharge home with [facility] PT, OT, and RN.    Right sphenoid sinus opacification  Incidental finding of complete opacification of the right sphenoid sinus on MRI suggestive for fungal sinusitis.  Patient with history of bleeding disorder, with anemia and thrombocytopenia high risk for invasive fungal infection such as Aspergillus or mucormycosis in setting of acute hyperglycemia with serum glucose greater than 900. Though there were no reports of facial pain or tenderness. ENT consulted, given no erosion of the bone or vision loss not emerge surgical intervention indicated. Will need outpatient follow up.    Macrocytic anemia Thrombocytopenia Patient with history of unknown bleeding disorder with macrocytic anemia and thrombocytopenia on admission. Does have significant amount of ecchymosis of the upper extremities and scatter across face and lower extremities. CBC remain stable during admission. B12 normal. Blood  smear with macrocytic anemia and anisocytosis and platelet clumping. Platelet remained low with citrate tube. Concerning for underlying myelodysplasic syndrome. Consider outpatient hematology follow up.    Right forehead nodule Noted to have a large 2.5 cm raise, rough, scaly nodule of the right forehead. Family reports this was seen by a dermatologist PA several years ago and treated as eczema with steroid cream with helped. No biopsy was taken at that time. Notes it has  gotten a bit larger recently as patient refusing to have cream applied. Given appearance and persistence of this lesion concern for underlying dermatologic malignancy. Recommend follow up with dermatology and possible biopsy to rule out underlying malignancy.

## 2020-08-03 NOTE — Progress Notes (Signed)
purewick removed at this time due to redness and irritation to perineal area. Patient instructed to notify RN of future incontinent episodes. She verbalizes understanding.

## 2020-08-03 NOTE — Progress Notes (Signed)
Occupational Therapy Treatment Patient Details Name: Casey Humphrey MRN: [id number] DOB: 04/07/1945 Today's Date: 08/03/2020    History of present illness 75yo female admitted 5/16 with AMS, hallucinations and R facial droop. CT/MRI negative for acute CVA. Admitted with acute metabolic and infectious encephalopathy, UTI. PMH anginal pain, Bleeders disease, CHF, CAD, DM with neuropathy, HTN, L knee arthroscopy, MI, CVA   OT comments  Pt seen together with PT to address transfer goals. Pt able to complete 3x intitating stand by pulling up on Stedy bar and with +2 max A. On third trial with assist given at ischial tuberosity pt able to clear buttocks from mattress about 75%; came to about 40% standing position. While the [facility] was/is therapeutically benefical it does not seem likely that by the time of d/c pt/caregivers will be able to utilize it for its intended use for functional transfers as pt would have to fully clear buttocks and come to about 75% standing position. Plan to try lateral scoots next session although at time of d/c pt likely will need [facility] lift for transfers (has one at home already).   Follow Up Recommendations  SNF;Supervision/Assistance - 24 hour    Equipment Recommendations  Other (comment) (if d/c home: hospital bed)    Recommendations for Other Services      Precautions / Restrictions Precautions Precautions: Fall Precaution Comments: at risk for skin break down. Sister reports redness on heels and an area on her low back. nursing aware. Restrictions Weight Bearing Restrictions: No       Mobility Bed Mobility Overal bed mobility: Needs Assistance Bed Mobility: Supine to Sit;Sit to Supine;Rolling Rolling: Mod assist   Supine to sit: Mod assist;HOB elevated;+2 for safety/equipment Sit to supine: Mod assist;+2 for safety/equipment   General bed mobility comments: Pt initiating trunk flexion and reaching for bed rail to pull up on. Assist to powerup  trunk and pivot hips to full EOB position    Transfers Overall transfer level: Needs assistance Equipment used:  Denna Haggard) Transfers: Sit to/from Stand Sit to Stand: Max assist;+2 physical assistance;+2 safety/equipment;From elevated surface         General transfer comment: Pt able to complete 3x intitating stand by pulling up on Stedy bar and with +2 max A. On third trial with assist given at ischial tuberosity pt able to clear buttocks from mattress about 75%. Also utilized momentum. fatigued quickly.    Balance Overall balance assessment: Needs assistance Sitting-balance support: Feet supported Sitting balance-Leahy Scale: Fair Sitting balance - Comments: able to static sit with up to BUE support at min guard level.       Standing balance comment: unable to assess                           ADL either performed or assessed with clinical judgement   ADL Overall ADL's : Needs assistance/impaired                             Toileting- Clothing Manipulation and Hygiene: Total assistance;Bed level Toileting - Clothing Manipulation Details (indicate cue type and reason): rolling to each side with mod-max A, total A for pericare task       General ADL Comments: Pt able to complete 3x intitating stand by pulling up on Stedy bar and with +2 max A. On third trial with assist given at ischial tuberosity pt able to clear buttocks from mattress  about 75%. While the [facility] was/is therapeutically benefical it does not in itself seem likely pt will be able to utilize for its intended functional use as pt would have to come to partial stand. Plan to try lateral scoots next session although realistically at time of d/c pt will most likely need Hoyer lift for transfers (has one at home already).     Vision       Perception     Praxis      Cognition Arousal/Alertness: Awake/alert Behavior During Therapy: Flat affect Overall Cognitive Status: Impaired/Different  from baseline Area of Impairment: Orientation;Attention;Memory;Following commands;Safety/judgement;Awareness;Problem solving                 Orientation Level: Disoriented to;Time;Situation Current Attention Level: Sustained Memory: Decreased short-term memory Following Commands: Follows one step commands consistently;Follows one step commands with increased time Safety/Judgement: Decreased awareness of safety;Decreased awareness of deficits Awareness: Intellectual Problem Solving: Slow processing;Decreased initiation;Difficulty sequencing;Requires verbal cues;Requires tactile cues General Comments: Minimal verbalizations, two-three word responses. demeanor brightened a bit while talking about her dog.        Exercises     Shoulder Instructions       General Comments      Pertinent Vitals/ Pain       Pain Assessment: No/denies pain  Home Living                                          Prior Functioning/Environment              Frequency  Min 2X/week        Progress Toward Goals  OT Goals(current goals can now be found in the care plan section)  Progress towards OT goals: Progressing toward goals (slowly)  Acute Rehab OT Goals Patient Stated Goal: Family: SNF for continued rehab. Pt: home. OT Goal Formulation: With patient/family Time For Goal Achievement: 08/16/20 Potential to Achieve Goals: Good  Plan Discharge plan remains appropriate    Co-evaluation                 AM-PAC OT "6 Clicks" Daily Activity     Outcome Measure   Help from another person eating meals?: A Lot Help from another person taking care of personal grooming?: A Lot Help from another person toileting, which includes using toliet, bedpan, or urinal?: Total Help from another person bathing (including washing, rinsing, drying)?: Total Help from another person to put on and taking off regular upper body clothing?: A Lot Help from another person to put on  and taking off regular lower body clothing?: Total 6 Click Score: 9    End of Session Equipment Utilized During Treatment: Other (comment) (stedy)  OT Visit Diagnosis: Other abnormalities of gait and mobility (R26.89);Muscle weakness (generalized) (M62.81);History of falling (Z91.81);Other symptoms and signs involving cognitive function;Pain;Hemiplegia and hemiparesis;Cognitive communication deficit (R41.841);Unsteadiness on feet (R26.81)   Activity Tolerance Patient limited by fatigue;Patient tolerated treatment well   Patient Left in bed;with call bell/phone within reach;with bed alarm set   Nurse Communication          Time: 6962-9528 OT Time Calculation (min): 32 min  Charges: OT General Charges $OT Visit: 1 Visit OT Treatments $Therapeutic Activity: 8-22 mins  Co-tx with PT  Tyrone Schimke, OT Acute Rehabilitation Services Pager: [phone] Office: [place], [place] 08/03/2020, 1:29 PM

## 2020-08-03 NOTE — Progress Notes (Signed)
Patient's current BP is 93/50 with a MAP of 61. Patient currently asymptomatic, denies having any complaints. Dr. Shon Baton notified via telephone, orders received to recheck BP within the next hour. Will continue to monitor.

## 2020-08-03 NOTE — TOC Progression Note (Signed)
Transition of Care [facility]) - Progression Note    Patient Details  Name: Casey Humphrey MRN: [id number] Date of Birth: 1946-01-11  Transition of Care [facility]) CM/SW [place], LCSW Phone Number: 08/03/2020, 3:27 PM  Clinical Narrative:    CSW received request to speak with patient's sister. CSW contacted her; she stated she had spoken with insurance and they told her patient's SNF days would start over since she had discharged home from SNF last Friday. CSW explained that the days only start over after a 60 day wellness period where the patient is out of a SNF and hospital. Patient admitted to [facility] on 06/30/20-07/28/20 and therefore would be responsible for copays ($187/day); CSW confirmed with Arbie Cookey at [place] [facility]. Patient's sister stated that patient does not qualify for Medicaid due to being part owner of her parents home and land. She reported understanding and requests being able to take the gait belt in patient's room home with patient; CSW will ask RN. Sister requests a call prior to calling PTAR home tomorrow.     Expected Discharge Plan: [place] Barriers to Discharge: Continued Medical Work up  Expected Discharge Plan and Services Expected Discharge Plan: [place] In-house Referral: Clinical Social Work Discharge Planning Services: CM Consult Post Acute Care Choice: Resumption of Svcs/PTA Provider,Home Health Living arrangements for the past 2 months: [place]: PT,OT [place] Date [place]: 08/01/20 Time [place]: 1245 Representative spoke with at [place]: Syl[place] ([place]) Interventions    Readmission Risk Interventions No flowsheet data found.

## 2020-08-03 NOTE — Progress Notes (Signed)
PT Cancellation Note  Patient Details Name: Madilynn Montante MRN: [id number] DOB: November 07, 1945   Cancelled Treatment:    Reason Eval/Treat Not Completed: Other (comment) just now getting on bedpan- will return later in day as time/schedule allows.    Windell Norfolk, DPT, PN1   Supplemental Physical Therapist [facility]    Pager [phone] Acute Rehab Office [phone]

## 2020-08-04 LAB — CBC
HCT: 29 % — ABNORMAL LOW (ref 36.0–46.0)
Hemoglobin: 9.8 g/dL — ABNORMAL LOW (ref 12.0–15.0)
MCH: 36.8 pg — ABNORMAL HIGH (ref 26.0–34.0)
MCHC: 33.8 g/dL (ref 30.0–36.0)
MCV: 109 fL — ABNORMAL HIGH (ref 80.0–100.0)
Platelets: 89 10*3/uL — ABNORMAL LOW (ref 150–400)
RBC: 2.66 MIL/uL — ABNORMAL LOW (ref 3.87–5.11)
RDW: 14.9 % (ref 11.5–15.5)
WBC: 5.9 10*3/uL (ref 4.0–10.5)
nRBC: 0 % (ref 0.0–0.2)

## 2020-08-04 LAB — GLUCOSE, CAPILLARY
Glucose-Capillary: 143 mg/dL — ABNORMAL HIGH (ref 70–99)
Glucose-Capillary: 202 mg/dL — ABNORMAL HIGH (ref 70–99)
Glucose-Capillary: 207 mg/dL — ABNORMAL HIGH (ref 70–99)
Glucose-Capillary: 257 mg/dL — ABNORMAL HIGH (ref 70–99)
Glucose-Capillary: 259 mg/dL — ABNORMAL HIGH (ref 70–99)
Glucose-Capillary: 303 mg/dL — ABNORMAL HIGH (ref 70–99)
Glucose-Capillary: 218 mg/dL — ABNORMAL HIGH (ref 70–99)
Glucose-Capillary: 218 mg/dL — ABNORMAL HIGH (ref 70–99)
Glucose-Capillary: 325 mg/dL — ABNORMAL HIGH (ref 70–99)

## 2020-08-04 LAB — BASIC METABOLIC PANEL
Anion gap: 4 — ABNORMAL LOW (ref 5–15)
BUN: 19 mg/dL (ref 8–23)
CO2: 28 mmol/L (ref 22–32)
Calcium: 9.4 mg/dL (ref 8.9–10.3)
Chloride: 102 mmol/L (ref 98–111)
Creatinine, Ser: 1.12 mg/dL — ABNORMAL HIGH (ref 0.44–1.00)
GFR, Estimated: 51 mL/min — ABNORMAL LOW (ref >60)
Glucose, Bld: 174 mg/dL — ABNORMAL HIGH (ref 70–99)
Potassium: 4.5 mmol/L (ref 3.5–5.1)
Sodium: 134 mmol/L — ABNORMAL LOW (ref 135–145)

## 2020-08-04 NOTE — Progress Notes (Addendum)
HD#4 Subjective:  Overnight Events: None  Patient examined at bedside. States heels are hurting are more. Does feels she gets out of breath easily but feels well alt rest. Ate breakfast and feels her urine is getting more clear. Needed help with eating food, was feeding herself at nursing facility.   Called Sister Butch Penny to update, expressed concern that patient is more weak and unable to sit up on her own or walk.   Objective:  Vital signs in last 24 hours: Vitals:   08/03/20 1633 08/03/20 1949 08/04/20 0026 08/04/20 0406  BP: (!) 130/44 (!) 127/42 (!) 123/57 133/64  Pulse: 84 85 84 82  Resp: 15 17 12 15   Temp: 98.7 F (37.1 C) 98.2 F (36.8 C) 98.1 F (36.7 C) 98 F (36.7 C)  TempSrc: Oral Oral Axillary Axillary  SpO2: 99% 97% 96% 96%  Weight:      Height:       Supplemental O2: Room Air SpO2: 96 %   Physical Exam:  Physical Exam Constitutional:      Appearance: Normal appearance.  Cardiovascular:     Rate and Rhythm: Normal rate and regular rhythm.     Comments: JVD Pulmonary:     Breath sounds: Normal breath sounds. No wheezing, rhonchi or rales.     Comments: Increased work of breathing with speaking Abdominal:     General: Abdomen is flat. Bowel sounds are normal.     Palpations: Abdomen is soft.  Skin:    Comments: Skin turgor improved from yesterday but somewhat diminished   Neurological:     General: No focal deficit present.     Mental Status: She is alert.     Comments: Able to count to 20     Filed Weights   07/31/20 1001 07/31/20 2045  Weight: 70.4 kg 75.3 kg     Intake/Output Summary (Last 24 hours) at 08/04/2020 0558 Last data filed at 08/03/2020 1716 Gross per 24 hour  Intake 866.85 ml  Output --  Net 866.85 ml   Net IO Since Admission: 4,262.11 mL [08/04/20 0558]  Pertinent Labs: CBC Latest Ref Rng & Units 08/04/2020 08/03/2020 08/02/2020  WBC 4.0 - 10.5 K/uL 5.9 6.7 -  Hemoglobin 12.0 - 15.0 g/dL 9.8(L) 9.8(L) -  Hematocrit 36.0  - 46.0 % 29.0(L) 29.6(L) -  Platelets 150 - 400 K/uL 89(L) PLATELET CLUMPS NOTED ON SMEAR, UNABLE TO ESTIMATE 105(L)    CMP Latest Ref Rng & Units 08/04/2020 08/03/2020 08/02/2020  Glucose 70 - 99 mg/dL 174(H) 231(H) 250(H)  BUN 8 - 23 mg/dL 19 21 21   Creatinine 0.44 - 1.00 mg/dL 1.12(H) 1.28(H) 1.20(H)  Sodium 135 - 145 mmol/L 134(L) 132(L) 132(L)  Potassium 3.5 - 5.1 mmol/L 4.5 4.0 4.1  Chloride 98 - 111 mmol/L 102 100 101  CO2 22 - 32 mmol/L 28 28 26   Calcium 8.9 - 10.3 mg/dL 9.4 9.5 9.7  Total Protein 6.5 - 8.1 g/dL - - 5.3(L)  Total Bilirubin 0.3 - 1.2 mg/dL - - 1.2  Alkaline Phos 38 - 126 U/L - - 88  AST 15 - 41 U/L - - 41  ALT 0 - 44 U/L - - 33    Imaging: No results found.  Assessment/Plan:   Principal Problem:   Acute metabolic and infectious encephalopathy Active Problems:   Acute symptomatic hyperglycemia   UTI (urinary tract infection)   Patient Summary: Ms. Hunsucker a 75y.o.woman with a history of HTN, G3TD, chronic diastolic HF, CAD,andCVA  r  management of acute metabolic andinfectious encephalopathysecondary to acute hyperglycemia and urinary tract infection  Acute metabolic and infectious encephalopathy Recurrent diabetes Mentation continues to improve. No further hallucinations. Considerably deconditioned. Easily fatigued with PT today.  -Continue PT, up to chair as able -Lantus 25 units  - SSI - CBG monitoring - D/c cardiac monitoring, transfer to med-surg to decrease lines and monitoring to prevent delerium -Start metformin on discharge  Urinary tract infection Feels urine in clearing. Purewick d/ced due to perineal irritation. .  -Continue amoxicillin 500 mg Q8H  Acute kidney injury from dehydration Cr improved to 1.1 this morning with BUN of 19. Skin turgor improving. -Encouraged PO fluid intake -Monitor BMP  Hypertension DBP soft in the 40s. Asymptomatic in room. - Stop propanolol  Chronic diastolic heart failure No JVD on exam.  Does not appear fluid overloaded. Stopped IV fluids due to improved kidney function.  - Hold diuretics, Consider restarting torsemide if she develops signs offluid overload - Daily weights - I/os  Macrocytic anemiawith chronic thrombocytopenia Hbg stable. Platelets down to 89 today. No signs of active bleeding -Continue to monitor CBC  Sacral ulcer Right heel ulcer Right toe wound Patient with heels on bed. Notes more pain in feet. Placed pillow under heels. -Prevalon boots -Continue wound care  Dispo: Anticipated discharge to Home in 1 days pending clinical improvement.   Iona Beard, MD 08/04/2020, 5:58 AM Pager: [phone]  Please contact the on call pager after 5 pm and on weekends at [phone].

## 2020-08-04 NOTE — Care Management Important Message (Signed)
Important Message  Patient Details  Name: Casey Humphrey MRN: [id number] Date of Birth: 12/12/1945   Medicare Important Message Given:  Yes - Important Message mailed due to current National Emergency  Verbal consent obtained due to current National Emergency  Relationship to patient: Self Contact Name: Clorissa Gruenberg Call Date: 08/04/20  Time: 57 Phone: [phone] Outcome: No Answer/Busy Important Message mailed to: Patient address on file     Delorse Lek 08/04/2020, 11:03 AM

## 2020-08-04 NOTE — Progress Notes (Signed)
Physical Therapy Treatment Patient Details Name: Casey Humphrey MRN: [id number] DOB: 1945-12-21 Today's Date: 08/04/2020    History of Present Illness 75yo female admitted 5/16 with AMS, hallucinations and R facial droop. CT/MRI negative for acute CVA. Admitted with acute metabolic and infectious encephalopathy, UTI. PMH anginal pain, Bleeders disease, CHF, CAD, DM with neuropathy, HTN, L knee arthroscopy, MI, CVA    PT Comments    Pt was seen initially for removal of bedpan and noted severe skin breakdown on sacral and glut areas.  Pt is in some amount of discomfort with skin, and nursing was in to help plan a strategy to avoid further skin injury.  Pt was determined to be in need of air mattress, and will get further guidance for permission to sit OOB in chair.  Follow up with goals of acute PT, focusing on standing endurance and balance control.   Follow Up Recommendations  SNF     Equipment Recommendations  Rolling walker with 5" wheels;3in1 (PT);Wheelchair (measurements PT);Wheelchair cushion (measurements PT);Hospital bed;Other (comment)    Recommendations for Other Services       Precautions / Restrictions Precautions Precautions: Fall Precaution Comments: at risk for skin break down. Sister reports redness on heels and an area on her low back. nursing aware. Required Braces or Orthoses: Other Brace Restrictions Weight Bearing Restrictions: No    Mobility  Bed Mobility Overal bed mobility: Needs Assistance Bed Mobility: Supine to Sit;Rolling;Sit to Sidelying Rolling: Mod assist   Supine to sit: Mod assist;+2 for physical assistance;HOB elevated   Sit to sidelying: Mod assist;+2 for physical assistance General bed mobility comments: instruction for pt to get up to stand but cannot tolerate    Transfers Overall transfer level: Needs assistance   Transfers: Lateral/Scoot Transfers          Lateral/Scoot Transfers: Total assist;+2 physical  assistance General transfer comment: unable to stand but lateral scoot with max of 1-2, pt is unable to use legs effectively  Ambulation/Gait                 Stairs             Wheelchair Mobility    Modified Rankin (Stroke Patients Only)       Balance Overall balance assessment: Needs assistance Sitting-balance support: Feet supported;Bilateral upper extremity supported Sitting balance-Leahy Scale: Fair Sitting balance - Comments: min assist to min guard sit balance                                    Cognition Arousal/Alertness: Awake/alert Behavior During Therapy: Flat affect Overall Cognitive Status: Within Functional Limits for tasks assessed Area of Impairment: Orientation;Attention;Memory;Following commands;Safety/judgement;Awareness;Problem solving                 Orientation Level: Disoriented to;Time;Situation Current Attention Level: Sustained Memory: Decreased short-term memory Following Commands: Follows one step commands with increased time;Follows one step commands inconsistently Safety/Judgement: Decreased awareness of safety;Decreased awareness of deficits Awareness: Emergent Problem Solving: Requires verbal cues;Difficulty sequencing;Slow processing        Exercises      General Comments General comments (skin integrity, edema, etc.): pt has skin breakdown that is hindering her ability to sit OOB in chair.  Contacted nursing and MD to get air mattress and to discuss being up on blistered skin around glut area      Pertinent Vitals/Pain Faces Pain Scale: Hurts little more Pain Location:  grimacing during pericare Pain Descriptors / Indicators: Grimacing    Home Living                      Prior Function            PT Goals (current goals can now be found in the care plan section) Acute Rehab PT Goals Patient Stated Goal: to get home Progress towards PT goals: Progressing toward goals     Frequency    Min 3X/week      PT Plan Discharge plan needs to be updated    Co-evaluation PT/OT/SLP Co-Evaluation/Treatment: Yes Reason for Co-Treatment: Complexity of the patient's impairments (multi-system involvement);For patient/therapist safety;To address functional/ADL transfers PT goals addressed during session: Mobility/safety with mobility;Balance        AM-PAC PT "6 Clicks" Mobility   Outcome Measure  Help needed turning from your back to your side while in a flat bed without using bedrails?: A Little Help needed moving from lying on your back to sitting on the side of a flat bed without using bedrails?: A Little Help needed moving to and from a bed to a chair (including a wheelchair)?: A Lot Help needed standing up from a chair using your arms (e.g., wheelchair or bedside chair)?: Total Help needed to walk in hospital room?: Total Help needed climbing 3-5 steps with a railing? : Total 6 Click Score: 11    End of Session Equipment Utilized During Treatment: Gait belt Activity Tolerance: Patient limited by fatigue;Patient limited by pain;Treatment limited secondary to medical complications (Comment) Patient left: in bed;with call bell/phone within reach;with bed alarm set Nurse Communication: Mobility status PT Visit Diagnosis: Unsteadiness on feet (R26.81);Difficulty in walking, not elsewhere classified (R26.2);Muscle weakness (generalized) (M62.81)     Time: 1315-1400 PT Time Calculation (min) (ACUTE ONLY): 45 min  Charges:  $Therapeutic Activity: 8-22 mins           Ramond Dial 08/04/2020, 8:11 PM  Mee Hives, PT MS Acute Rehab Dept. Number: [place] and [place]

## 2020-08-04 NOTE — Progress Notes (Signed)
Occupational Therapy Treatment Patient Details Name: Casey Humphrey MRN: [id number] DOB: 08/06/1945 Today's Date: 08/04/2020    History of present illness 75yo female admitted 5/16 with AMS, hallucinations and R facial droop. CT/MRI negative for acute CVA. Admitted with acute metabolic and infectious encephalopathy, UTI. PMH anginal pain, Bleeders disease, CHF, CAD, DM with neuropathy, HTN, L knee arthroscopy, MI, CVA   OT comments  Pt making limited overall progress towards acute OT goals. Continues to need +2 assist for bed mobility, mod A with UB ADLs, will need hoyer lift for OOB transfers. Pt with new, large, fluid-filled blisters on sacrum, nursing notified and present during session. Recommend pt be moved onto a bed with a circulating air mattress as she is at high risk for further skin breakdown. Still no viable d/c plan in place to my knowledge. Pt lives with her daughter who endorses that she has dwarfism and unable to provide physical assist to pt. Financial barrier to SNF remains.   Follow Up Recommendations  SNF;Supervision/Assistance - 24 hour    Equipment Recommendations  Other (comment) (if d/c home: hospital bed)    Recommendations for Other Services      Precautions / Restrictions Precautions Precautions: Fall Precaution Comments: at risk for skin break down. Sister reports redness on heels and an area on her low back. nursing aware. Required Braces or Orthoses: Other Brace Restrictions Weight Bearing Restrictions: No       Mobility Bed Mobility Overal bed mobility: Needs Assistance Bed Mobility: Supine to Sit;Rolling;Sit to Sidelying Rolling: Mod assist   Supine to sit: Mod assist;+2 for physical assistance;HOB elevated   Sit to sidelying: Mod assist;+2 for physical assistance General bed mobility comments: intitate reaching for rails and trying to power her hips in direction she needs to go. assist for all aspects including sequencing.     Transfers Overall transfer level: Needs assistance   Transfers: Lateral/Scoot Transfers          Lateral/Scoot Transfers: Total assist;+2 physical assistance General transfer comment: lateral scoot 3x along EOB towards right side. max-total A +2 physical assist to minimally scoot    Balance Overall balance assessment: Needs assistance Sitting-balance support: Feet supported;Bilateral upper extremity supported Sitting balance-Leahy Scale: Fair Sitting balance - Comments: able to static sit with up to BUE support at min guard level.                                   ADL either performed or assessed with clinical judgement   ADL Overall ADL's : Needs assistance/impaired                             Toileting- Clothing Manipulation and Hygiene: Total assistance;Bed level         General ADL Comments: Pt rolled to each side a few times for pericare and pad replacement. Pt then came to EOB and sat with BUE for a several minutes. Attempted lateral scoots with pt needing total A +2. Placed pt in sdielying position at end of session.     Vision       Perception     Praxis      Cognition Arousal/Alertness: Awake/alert Behavior During Therapy: Flat affect Overall Cognitive Status: Within Functional Limits for tasks assessed Area of Impairment: Orientation;Attention;Memory;Following commands;Safety/judgement;Awareness;Problem solving  Orientation Level: Disoriented to;Time;Situation Current Attention Level: Sustained Memory: Decreased short-term memory Following Commands: Follows one step commands consistently;Follows one step commands with increased time Safety/Judgement: Decreased awareness of safety;Decreased awareness of deficits Awareness: Emergent Problem Solving: Requires verbal cues;Difficulty sequencing          Exercises     Shoulder Instructions       General Comments      Pertinent Vitals/ Pain        Pain Assessment: Faces Faces Pain Scale: Hurts little more Pain Location: grimacing during pericare Pain Descriptors / Indicators: Grimacing Pain Intervention(s): Limited activity within patient's tolerance;Monitored during session;Repositioned  Home Living                                          Prior Functioning/Environment              Frequency  Min 2X/week        Progress Toward Goals  OT Goals(current goals can now be found in the care plan section)  Progress towards OT goals: Not progressing toward goals - comment  Acute Rehab OT Goals Patient Stated Goal: Family: SNF for continued rehab. Pt: home. OT Goal Formulation: With patient/family Time For Goal Achievement: 08/16/20 Potential to Achieve Goals: Good  Plan Discharge plan remains appropriate    Co-evaluation                 AM-PAC OT "6 Clicks" Daily Activity     Outcome Measure   Help from another person eating meals?: A Lot Help from another person taking care of personal grooming?: A Lot Help from another person toileting, which includes using toliet, bedpan, or urinal?: Total Help from another person bathing (including washing, rinsing, drying)?: Total Help from another person to put on and taking off regular upper body clothing?: A Lot Help from another person to put on and taking off regular lower body clothing?: Total 6 Click Score: 9    End of Session    OT Visit Diagnosis: Other abnormalities of gait and mobility (R26.89);Muscle weakness (generalized) (M62.81);History of falling (Z91.81);Other symptoms and signs involving cognitive function;Pain;Hemiplegia and hemiparesis;Cognitive communication deficit (R41.841);Unsteadiness on feet (R26.81)   Activity Tolerance Patient limited by fatigue;Patient limited by pain   Patient Left in bed;with call bell/phone within reach;with bed alarm set   Nurse Communication Other (comment) (new fluid-filled blisters on sacrum. RN  providing wound care during session. Also discussed getting pt a bed with a circulating air mattress)        Time: 1315-1400 OT Time Calculation (min): 45 min  Charges: OT General Charges $OT Visit: 1 Visit OT Treatments $Self Care/Home Management : 8-22 mins $Therapeutic Activity: 8-22 mins  Tyrone Schimke, OT Acute Rehabilitation Services Pager: [phone] Office: [place], [place] 08/04/2020, 2:18 PM

## 2020-08-04 NOTE — Consult Note (Addendum)
WOC Nurse Consult Note: Patient receiving care in [facility], [place] [phone] Reason for Consult: stage 2 bottom This patient was seen by S. Tora Perches, [place] nurse on 08/02/20 and orders were placed in the chart. Please see note and orders.  Per pending note from Dr. Lisabeth Devoid, "There is a non-healing blood blister on her R great toe.She also has multiple bruises along her distal metacarpals" Placed order for the bedside RN to paint the toe with betadine daily and allow to air dry.   1354: [place]oper from bedside nurse stating patient has developed blisters on her right hip. Order placed for the RN to cover bullae with Xeroform gauze and secure with foam dressing. Change daily.  This patient is on the [facility] FU list and will be seen next Monday or Tuesday.   Cathlean Marseilles Tamala Julian, MSN, RN, [place], Lysle Pearl, [facility] Wound Treatment Associate Pager [phone]

## 2020-08-05 LAB — CBC
HCT: 29.6 % — ABNORMAL LOW (ref 36.0–46.0)
Hemoglobin: 10 g/dL — ABNORMAL LOW (ref 12.0–15.0)
MCH: 36.6 pg — ABNORMAL HIGH (ref 26.0–34.0)
MCHC: 33.8 g/dL (ref 30.0–36.0)
MCV: 108.4 fL — ABNORMAL HIGH (ref 80.0–100.0)
Platelets: 104 10*3/uL — ABNORMAL LOW (ref 150–400)
RBC: 2.73 MIL/uL — ABNORMAL LOW (ref 3.87–5.11)
RDW: 15.1 % (ref 11.5–15.5)
WBC: 6.3 10*3/uL (ref 4.0–10.5)
nRBC: 0 % (ref 0.0–0.2)

## 2020-08-05 LAB — BASIC METABOLIC PANEL
Anion gap: 6 (ref 5–15)
BUN: 18 mg/dL (ref 8–23)
CO2: 27 mmol/L (ref 22–32)
Calcium: 9.7 mg/dL (ref 8.9–10.3)
Chloride: 99 mmol/L (ref 98–111)
Creatinine, Ser: 0.97 mg/dL (ref 0.44–1.00)
GFR, Estimated: >60 mL/min (ref >60)
Glucose, Bld: 305 mg/dL — ABNORMAL HIGH (ref 70–99)
Potassium: 4.7 mmol/L (ref 3.5–5.1)
Sodium: 132 mmol/L — ABNORMAL LOW (ref 135–145)

## 2020-08-05 LAB — GLUCOSE, CAPILLARY
Glucose-Capillary: 227 mg/dL — ABNORMAL HIGH (ref 70–99)
Glucose-Capillary: 249 mg/dL — ABNORMAL HIGH (ref 70–99)
Glucose-Capillary: 290 mg/dL — ABNORMAL HIGH (ref 70–99)
Glucose-Capillary: 278 mg/dL — ABNORMAL HIGH (ref 70–99)

## 2020-08-05 MED ORDER — INSULIN GLARGINE 100 UNIT/ML ~~LOC~~ SOLN
30.0000 [IU] | Freq: Every day | SUBCUTANEOUS | Status: DC
  Administered 2020-08-05 – 2020-08-07 (×3): 30 [IU] via SUBCUTANEOUS
  Filled 2020-08-05 (×4): qty 0.3

## 2020-08-05 NOTE — Progress Notes (Addendum)
HD#5 Subjective:  Overnight Events: None  Casey Humphrey was seen at bedside. She states that she is doing well. She still has some pain in her heels and now on her bottom. She states that she has a [employer] fast streamer at home.   Objective:  Vital signs in last 24 hours: Vitals:   08/04/20 1700 08/04/20 1938 08/04/20 2335 08/05/20 0343  BP: 118/69 (!) 114/55 (!) 147/69 (!) 129/59  Pulse: 96 100 96 (!) 106  Resp: 16 20 16 18   Temp: 97.9 F (36.6 C) 98 F (36.7 C) 98 F (36.7 C) 98.1 F (36.7 C)  TempSrc: Oral Oral Oral Oral  SpO2: 97% 97% 98% 97%  Weight:      Height:       Supplemental O2: Room Air SpO2: 97 %   Physical Exam:  Physical Exam Constitutional:      General: She is not in acute distress.    Appearance: She is not toxic-appearing or diaphoretic.  Cardiovascular:     Rate and Rhythm: Normal rate and regular rhythm.     Pulses: Normal pulses.     Heart sounds: Normal heart sounds.  Pulmonary:     Effort: Pulmonary effort is normal.     Comments: Continued increased work of breathing with speaking, improved somewhat from yesterday.  Abdominal:     General: Abdomen is flat. Bowel sounds are normal.     Tenderness: There is no abdominal tenderness.  Neurological:     Mental Status: She is alert.     Comments: Able to count to 20.      Filed Weights   07/31/20 1001 07/31/20 2045  Weight: 70.4 kg 75.3 kg    No intake or output data in the 24 hours ending 08/05/20 1751 Net IO Since Admission: 4,262.11 mL [08/05/20 0613]  Pertinent Labs: CBC Latest Ref Rng & Units 08/05/2020 08/04/2020 08/03/2020  WBC 4.0 - 10.5 K/uL 6.3 5.9 6.7  Hemoglobin 12.0 - 15.0 g/dL 10.0(L) 9.8(L) 9.8(L)  Hematocrit 36.0 - 46.0 % 29.6(L) 29.0(L) 29.6(L)  Platelets 150 - 400 K/uL 104(L) 89(L) PLATELET CLUMPS NOTED ON SMEAR, UNABLE TO ESTIMATE    CMP Latest Ref Rng & Units 08/05/2020 08/04/2020 08/03/2020  Glucose 70 - 99 mg/dL 305(H) 174(H) 231(H)  BUN 8 - 23 mg/dL 18 19 21   Creatinine  0.44 - 1.00 mg/dL 0.97 1.12(H) 1.28(H)  Sodium 135 - 145 mmol/L 132(L) 134(L) 132(L)  Potassium 3.5 - 5.1 mmol/L 4.7 4.5 4.0  Chloride 98 - 111 mmol/L 99 102 100  CO2 22 - 32 mmol/L 27 28 28   Calcium 8.9 - 10.3 mg/dL 9.7 9.4 9.5  Total Protein 6.5 - 8.1 g/dL - - -  Total Bilirubin 0.3 - 1.2 mg/dL - - -  Alkaline Phos 38 - 126 U/L - - -  AST 15 - 41 U/L - - -  ALT 0 - 44 U/L - - -   CBG (last 3)  Recent Labs    08/04/20 1201 08/04/20 1738 08/04/20 2045  GLUCAP 218* 218* 325*   Imaging: No results found.  Assessment/Plan:   Principal Problem:   Acute metabolic and infectious encephalopathy Active Problems:   Acute symptomatic hyperglycemia   UTI (urinary tract infection)   Patient Summary: Casey Humphrey a 75y.o.woman with a history of HTN, W2HE, chronic diastolic HF, CAD,andCVA  r management of acute metabolic andinfectious encephalopathysecondary to acute hyperglycemia and urinary tract infection  Acute metabolic and infectious encephalopathy Recurrent diabetes Mentation continues to improve.  No further hallucinations. Considerably deconditioned. -Continue PT, up to chair as able - Increased Lantus to 30 units  - SSI - CBG monitoring -Start metformin on discharge  Urinary tract infection -Continue amoxicillin 500 mg Q8H  Acute kidney injury from dehydration Cr improved to 0.97 -Encouraged PO fluid intake -Monitor BMP  Hypertension DBP soft in the 40s. Asymptomatic in room. - Stop propanolol  Chronic diastolic heart failure  - Hold diuretics, Consider restarting torsemide if she develops signs offluid overload - Daily weights - I/Os  Macrocytic anemiawith chronic thrombocytopenia Hbg stable. Platelets 104. No signs of active bleeding. -Continue to monitor CBC  Sacral ulcer Right heel ulcer Right toe wound -Prevalon boots -Continue wound care  Dispo: Anticipated discharge to Home in 1 days pending clinical improvement.   Maudie Mercury, MD 08/05/2020, 6:13 AM Pager: [phone]  Please contact the on call pager after 5 pm and on weekends at [phone].

## 2020-08-06 LAB — BASIC METABOLIC PANEL
Anion gap: 6 (ref 5–15)
BUN: 20 mg/dL (ref 8–23)
CO2: 24 mmol/L (ref 22–32)
Calcium: 9.7 mg/dL (ref 8.9–10.3)
Chloride: 102 mmol/L (ref 98–111)
Creatinine, Ser: 0.88 mg/dL (ref 0.44–1.00)
GFR, Estimated: >60 mL/min (ref >60)
Glucose, Bld: 206 mg/dL — ABNORMAL HIGH (ref 70–99)
Potassium: 4.5 mmol/L (ref 3.5–5.1)
Sodium: 132 mmol/L — ABNORMAL LOW (ref 135–145)

## 2020-08-06 LAB — CBC
HCT: 29.7 % — ABNORMAL LOW (ref 36.0–46.0)
Hemoglobin: 9.8 g/dL — ABNORMAL LOW (ref 12.0–15.0)
MCH: 36.6 pg — ABNORMAL HIGH (ref 26.0–34.0)
MCHC: 33 g/dL (ref 30.0–36.0)
MCV: 110.8 fL — ABNORMAL HIGH (ref 80.0–100.0)
Platelets: 88 10*3/uL — ABNORMAL LOW (ref 150–400)
RBC: 2.68 MIL/uL — ABNORMAL LOW (ref 3.87–5.11)
RDW: 15.1 % (ref 11.5–15.5)
WBC: 5.9 10*3/uL (ref 4.0–10.5)
nRBC: 0 % (ref 0.0–0.2)

## 2020-08-06 LAB — GLUCOSE, CAPILLARY
Glucose-Capillary: 189 mg/dL — ABNORMAL HIGH (ref 70–99)
Glucose-Capillary: 264 mg/dL — ABNORMAL HIGH (ref 70–99)
Glucose-Capillary: 234 mg/dL — ABNORMAL HIGH (ref 70–99)
Glucose-Capillary: 255 mg/dL — ABNORMAL HIGH (ref 70–99)

## 2020-08-06 MED ORDER — METOPROLOL SUCCINATE ER 25 MG PO TB24
12.5000 mg | ORAL_TABLET | Freq: Every day | ORAL | Status: DC
  Administered 2020-08-06 – 2020-08-08 (×3): 12.5 mg via ORAL
  Filled 2020-08-06 (×3): qty 1

## 2020-08-06 NOTE — Discharge Summary (Incomplete)
Name: Casey Humphrey MRN: [id number] DOB: 03-23-1945 75 y.o. PCP: Angelina Sheriff, MD  Date of Admission: 07/31/2020  9:14 AM Date of Discharge:  08/06/20 Attending Physician: Angelica Pou, MD  Discharge Diagnosis: 1. Acute metabolic and infectious encephalopathy 2. HHS  3. Acute pre-renal kidney injury 4. Urinary tract infection 5. Multiple  pressure injuries  6. Right sinus opacification 7. Diabetes mellitus 8. Hypertension  Discharge Medications: Allergies as of 08/06/2020      Reactions   Codeine Nausea And Vomiting   Quinine Derivatives Nausea And Vomiting   Sulfa Antibiotics Nausea And Vomiting, Other (See Comments)   "gets real sick" per family   Adhesive [tape] Rash   Penicillins Rash   Procardia [nifedipine] Rash    Med Rec must be completed prior to using this [facility]***       Disposition and follow-up:   [name] was discharged from [facility] in Stable condition.  At the hospital follow up visit please address:  1.  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  Diabetes -Elevated A1c of 10.2 appears recurrent -Started on metformin, please assess glucose and adjust medication as needed  Heart failure, hypertension - diuretics and propanolol held due to normotension and dehydration in setting of HHS -please evaluate volume status and restart diuretics as needed - HR in low 120s after discontinuation of betablocker, may need further evaluation if persistent for more than 2 weeks  AKI -resolved by discharge, assess PO intake  Pressure injuries - Please evaluate multiple pressure and friction related injuries for healing and infection, please assess if patient is receiving wound care and family able to assist  with this  Right forehead nodule - concerning for malignancy, would benefit from biopsy  Right sphenoid sinus opacification  -asymptomatic, not on treatment, needs ENT follow up  History of left knee fracture s/p ORIF - Persistent deformity on imaging and effussion, likely in setting of severe osteoporosis. Unlikely to be improved by further surgical intervention, recommend supportive care.   2.  Labs / imaging needed at time of follow-up: CBC, BMP  3.  Pending labs/ test needing follow-up: none  Follow-up Appointments:  Follow-up Information    Angelina Sheriff, MD Follow up.   Specialty: Family Medicine Contact information: [place] [address] [address] [place] Hospital Course by problem list: Patient Summary: [name] is a 75 y.o. woman with a history of HTN, [id number], chronic diastolic HF, CAD, and CVA  for management of acute metabolic and infectious encephalopathy secondary to acute hyperglycemia and urinary tract infection   Acute metabolic and infectious encephalopathy Patient presenting with declining health for the past 6 months. Recently hospitalized for pneumonia before discharge to SNF in April. Recently discharged home. Has had worsening hallucination of decreased loved ones and birds. Found to have serum blood glucose greater than  900 without acidosis. Admitted for HHS. Was discontinued off metformin 5 years ago by PCP and never rechecked per family. A1c 10.2 this admission. Started on insulin infusion and fluid with improvement. Transitioned to Lantus 25 units daily. Also found to have UTI and started on antibiotics.Was noted to have a pill rolling termor intermittently as well as stereo typed movements. Concerning for underlying dementia with Lewy  bodies vs other movement disorder with symptoms execrated in setting of acute hyperglycemia. Mental status improved during admission.  No further hallucinations during admission.    Urinary tract infection Patient presenting with burning with urination started empirically on ceftriaxone. Urine cultures growing E. Faecalis switched to IV ampicillin and then completed course on oral amoxacillin. Consider follow up colonoscopy as outpatient.    Acute kidney injury due to dehydration in setting of HHS Patient presented with elevated Cr of 1.68 in setting of dehydration due to HHS, continue diuretics, and decreased oral intake in setting of metabolic encephalopathy. Improved with IV fluids. Creatinine on day of discharge 0.88.   Chronic diastolic heart failure Hypertension Diuretics held in setting of dehydration due to HHS. BPs have been soft and propranolol discontinued. HR elevated to 110-120 without symptoms. Like represents rebound from betablocker discontinuation, if still elevated in 2 week may require further workup.  Sacral wound Right calcaneal wound Patient presenting with stage 2 decubitus ulcer, and right great toe. Family reports right foot was stepped prior to admission. Health has been declining since the beginning of this year. Significant decondition and has been mostly wheelchair and bed bound in the past several months. Wound care continued during admission. Started on nutritional supplements for low albumin and poor nutritional status. Unfortunately patient has used up all her SNF days. Plan to discharge home with [facility] PT, OT, and RN.   Right sphenoid sinus opacification  Incidental finding of complete opacification of the right sphenoid sinus on MRI suggestive for fungal sinusitis.  Patient with history of bleeding disorder, with anemia and thrombocytopenia high risk for invasive fungal infection such as Aspergillus or mucormycosis in setting of acute hyperglycemia with serum glucose greater than 900. Though there were no reports of facial pain or tenderness. ENT consulted, given no erosion of the bone or vision loss not emerge surgical intervention indicated.  Will need outpatient follow up.    Macrocytic anemia Thrombocytopenia Patient with history of unknown bleeding disorder with macrocytic anemia and thrombocytopenia on admission. Does have significant amount of ecchymosis of the upper extremities and scatter across face and lower extremities. CBC remain stable during admission. B12 normal. Blood  smear with macrocytic anemia and anisocytosis and platelet clumping. Platelet remained low with citrate tube. Concerning for underlying bone marrow suppression. Consider outpatient hematology follow up.   Right forehead nodule Noted to have a large 2.5 cm raise, rough, scaly nodule of the right forehead. Family reports this was seen by a dermatologist PA several years ago and treated as eczema with steroid cream with helped. No biopsy was taken at that time. Notes it has gotten a bit larger recently as patient refusing to have cream applied. Given appearance and persistence of this lesion concern for underlying dermatologic malignancy. Recommend follow up with dermatology and possible biopsy to rule out underlying malignancy.   Discharge Exam:   BP 138/83 (BP Location: Left Arm)   Pulse (!) 122   Temp 98.4 F (36.9 C) (Axillary)   Resp 18   Ht 5\' 2"  (1.575 m)   Wt 110.7 kg   SpO2 97%   BMI 44.63 kg/m  Discharge exam:  Physical Exam Constitutional:      Appearance: Normal appearance.  Cardiovascular:     Rate and Rhythm: Regular rhythm. Tachycardia present.     Heart sounds: No murmur heard. No friction rub. No gallop.   Pulmonary:     Breath sounds: No wheezing or rales.     Comments: Increase  effort when speaking in full sentances Abdominal:     General: Abdomen is flat. Bowel sounds are normal.     Palpations: Abdomen is soft.  Musculoskeletal:     Comments: Dependent edema of bilateral lower extremities up to knees  Skin:    Comments: Heel wounds clean and dressed  Neurological:     General: No focal deficit present.     Mental  Status: She is alert. Mental status is at baseline.      Pertinent Labs, Studies, and Procedures:  CBC Latest Ref Rng & Units 08/06/2020 08/05/2020 08/04/2020  WBC 4.0 - 10.5 K/uL 5.9 6.3 5.9  Hemoglobin 12.0 - 15.0 g/dL 9.8(L) 10.0(L) 9.8(L)  Hematocrit 36.0 - 46.0 % 29.7(L) 29.6(L) 29.0(L)  Platelets 150 - 400 K/uL 88(L) 104(L) 89(L)   CMP Latest Ref Rng & Units 08/06/2020 08/05/2020 08/04/2020  Glucose 70 - 99 mg/dL 206(H) 305(H) 174(H)  BUN 8 - 23 mg/dL 20 18 19   Creatinine 0.44 - 1.00 mg/dL 0.88 0.97 1.12(H)  Sodium 135 - 145 mmol/L 132(L) 132(L) 134(L)  Potassium 3.5 - 5.1 mmol/L 4.5 4.7 4.5  Chloride 98 - 111 mmol/L 102 99 102  CO2 22 - 32 mmol/L 24 27 28   Calcium 8.9 - 10.3 mg/dL 9.7 9.7 9.4  Total Protein 6.5 - 8.1 g/dL - - -  Total Bilirubin 0.3 - 1.2 mg/dL - - -  Alkaline Phos 38 - 126 U/L - - -  AST 15 - 41 U/L - - -  ALT 0 - 44 U/L - - -   DG Chest 2 View  Result Date: 07/31/2020 CLINICAL DATA:  Chest pain, altered mental status EXAM: CHEST - 2 VIEW COMPARISON:  09/23/2019 FINDINGS: Heart is normal size. Left base atelectasis or infiltrate. Right lung clear. No effusions or acute bony abnormality. IMPRESSION: Left base atelectasis or infiltrate. Electronically Signed   By: Rolm Baptise M.D.   On: 07/31/2020 10:52   DG Knee 1-2 Views Left  Result Date: 08/01/2020 CLINICAL DATA:  Left knee pain after recent fall, prior distal femoral ORIF on 09/24/2019 EXAM: LEFT KNEE - 1-2 VIEW COMPARISON:  09/24/2019 FINDINGS: Retrograde IM rod in the distal femur, one of the distal femoral screws has been removed compared to 09/24/2019. There is still some substantial in somewhat irregular deformity in the vicinity of the prior fracture although particularly on the lateral projection there appears to be bony bridging. I do not see a new fracture although the appearance in the vicinity of the partially healed fracture is somewhat complex. Underlying bony demineralization is present and there  is osteoarthritis of the knee with tricompartmental spurring and articular space loss. SFA atherosclerosis noted. IMPRESSION: 1. Residual deformity in the vicinity of the prior distal femoral fracture, traversed by a retrograde IM nail with proximal and distal interlocking screws. I do not see a definite new fracture although the appearance in the vicinity of the previous fracture remains somewhat complex. 2. Osteoarthritis of the knee. 3. Bony demineralization. 4. Atherosclerosis. Electronically Signed   By: Van Clines M.D.   On: 08/01/2020 15:18   MR BRAIN WO CONTRAST  Result Date: 07/31/2020 CLINICAL DATA:  Neuro deficit, acute stroke suspected. EXAM: MRI HEAD WITHOUT CONTRAST TECHNIQUE: Multiplanar, multiecho pulse sequences of the brain and surrounding structures were obtained without intravenous contrast. COMPARISON:  Same day CT head. FINDINGS: Brain: No acute infarction, hemorrhage, hydrocephalus, extra-axial collection or mass lesion. Mild for age generalized cerebral volume loss. Mild for age scattered T2/FLAIR hyperintensities within  the white matter, most likely related to chronic microvascular ischemic disease. Cavum septum pellucidum et vergae, anatomic variant. Vascular: Major arterial flow voids are maintained at the skull base. Skull and upper cervical spine: Normal marrow signal. Sinuses/Orbits: Complete opacification of the right sphenoid sinus which is expanded and largely T2 hypointense and T1 hyperintense. There were areas of internal hyperdensity seen on same day CT head. Other: No mastoid effusions. IMPRESSION: 1. No evidence of acute intracranial abnormality. Specifically, no acute infarct. 2. Complete opacification of an expanded right sphenoid sinus with findings suggestive of fungal sinusitis, as detailed above. Electronically Signed   By: Margaretha Sheffield MD   On: 07/31/2020 13:29   US RENAL  Result Date: 07/31/2020 CLINICAL DATA:  Acute kidney injury EXAM: RENAL /  URINARY TRACT ULTRASOUND COMPLETE COMPARISON:  CT 06/15/2020 FINDINGS: Right Kidney: Renal measurements: 9.3 x 4.7 x 4.1 cm = volume: 93.9 mL. Echogenicity within normal limits. No mass or hydronephrosis Left Kidney: Renal measurements: 10.6 x 4.7 x 7 cm = volume: 182. mL. Difficult visualization. Echogenicity grossly within normal limits. No hydronephrosis. Questionable cortical calcification measuring 12 mm at the mid left kidney, though no calcified stone on CT from March. Vague hypoechoic area at mid pole left kidney measuring 20 mm. Bladder: Appears normal for degree of bladder distention. Other: None. IMPRESSION: 1. Negative for hydronephrosis. 2. Poorly visible left kidney secondary to bowel gas and mobility. Questionable hypoechoic lesion at the mid left kidney for which sonographic follow-up is recommended. Electronically Signed   By: Donavan Foil M.D.   On: 07/31/2020 18:48   CT HEAD CODE STROKE WO CONTRAST  Result Date: 07/31/2020 CLINICAL DATA:  Code stroke. Acute neuro deficit. Right facial droop EXAM: CT HEAD WITHOUT CONTRAST TECHNIQUE: Contiguous axial images were obtained from the base of the skull through the vertex without intravenous contrast. COMPARISON:  CT head 12/22/2012 FINDINGS: Brain: Progressive atrophy. Negative for hydrocephalus. Mild patchy white matter hypodensity with progression. Negative for acute infarct, hemorrhage, mass. Vascular: Negative for hyperdense vessel Skull: Negative Sinuses/Orbits: Extensive opacification of the sphenoid sinus with hyperdensity and expansion of the sinus on the right. This area was clear previously. Remaining sinuses clear. Other: None ASPECTS ([place] Stroke Program Early CT Score) - Ganglionic level infarction (caudate, lentiform nuclei, internal capsule, insula, M1-M3 cortex): 7 - Supraganglionic infarction (M4-M6 cortex): 3 Total score (0-10 with 10 being normal): 10 IMPRESSION: 1. No acute intracranial abnormality 2. ASPECTS is 10 3.  Extensive expansile opacification and hyperdensity throughout the right sphenoid sinus. This may be due to sinusitis with superimposed fungal infection versus mass lesion. ENT consultation recommended. 4. Code stroke imaging results were communicated on 07/31/2020 at 9:31 am to provider Rory Percy via text page Electronically Signed   By: Franchot Gallo M.D.   On: 07/31/2020 09:32    Discharge Instructions:   Signed: Iona Beard, MD 08/06/2020, 9:37 AM   Pager: [phone]

## 2020-08-06 NOTE — Progress Notes (Signed)
HD#6 Subjective:  Overnight Events: None  Ms. Murrill was seen at bedside. She states that she is doing well. Has some mild pain in her sacrum and heels. Expresses she would like to go home and be outside.   Objective:  Vital signs in last 24 hours: Vitals:   08/06/20 0343 08/06/20 0803 08/06/20 0900 08/06/20 1230  BP: 128/65 138/83  (!) 150/68  Pulse: (!) 108 (!) 122 (!) 113 (!) 119  Resp: 18 18 17 18   Temp: 97.6 F (36.4 C) 98.4 F (36.9 C)  99.3 F (37.4 C)  TempSrc: Oral Axillary  Oral  SpO2: 97%  97% 98%  Weight: 110.7 kg     Height:       Supplemental O2: Room Air SpO2: 98 %   Physical Exam:  Physical Exam Constitutional:      General: She is not in acute distress.    Appearance: She is not toxic-appearing or diaphoretic.  Cardiovascular:     Rate and Rhythm: Regular rhythm. Tachycardia present.     Pulses: Normal pulses.     Heart sounds: Normal heart sounds.  Pulmonary:     Effort: Pulmonary effort is normal.     Comments: Continued increased work of breathing with speaking, improved somewhat from yesterday.  Abdominal:     General: Abdomen is flat. Bowel sounds are normal.     Tenderness: There is no abdominal tenderness.  Neurological:     General: No focal deficit present.     Mental Status: She is alert and oriented to person, place, and time.     Filed Weights   07/31/20 1001 07/31/20 2045 08/06/20 0343  Weight: 70.4 kg 75.3 kg 110.7 kg     Intake/Output Summary (Last 24 hours) at 08/06/2020 1256 Last data filed at 08/05/2020 1500 Gross per 24 hour  Intake 120 ml  Output --  Net 120 ml   Net IO Since Admission: 4,502.11 mL [08/06/20 1256]  Pertinent Labs: CBC Latest Ref Rng & Units 08/06/2020 08/05/2020 08/04/2020  WBC 4.0 - 10.5 K/uL 5.9 6.3 5.9  Hemoglobin 12.0 - 15.0 g/dL 9.8(L) 10.0(L) 9.8(L)  Hematocrit 36.0 - 46.0 % 29.7(L) 29.6(L) 29.0(L)  Platelets 150 - 400 K/uL 88(L) 104(L) 89(L)    CMP Latest Ref Rng & Units 08/06/2020  08/05/2020 08/04/2020  Glucose 70 - 99 mg/dL 206(H) 305(H) 174(H)  BUN 8 - 23 mg/dL 20 18 19   Creatinine 0.44 - 1.00 mg/dL 0.88 0.97 1.12(H)  Sodium 135 - 145 mmol/L 132(L) 132(L) 134(L)  Potassium 3.5 - 5.1 mmol/L 4.5 4.7 4.5  Chloride 98 - 111 mmol/L 102 99 102  CO2 22 - 32 mmol/L 24 27 28   Calcium 8.9 - 10.3 mg/dL 9.7 9.7 9.4  Total Protein 6.5 - 8.1 g/dL - - -  Total Bilirubin 0.3 - 1.2 mg/dL - - -  Alkaline Phos 38 - 126 U/L - - -  AST 15 - 41 U/L - - -  ALT 0 - 44 U/L - - -   CBG (last 3)  Recent Labs    08/05/20 2046 08/06/20 0756 08/06/20 1228  GLUCAP 278* 189* 264*   Imaging: No results found.  Assessment/Plan:   Principal Problem:   Acute metabolic and infectious encephalopathy Active Problems:   Acute symptomatic hyperglycemia   UTI (urinary tract infection)   Patient Summary: Ms. Cushman a 75y.o.woman with a history of HTN, D3UK, chronic diastolic HF, CAD,andCVA  r management of acute metabolic andinfectious encephalopathysecondary to acute hyperglycemia  and urinary tract infection  Acute metabolic and infectious encephalopathy Recurrent diabetes Mentation stable. Considerably deconditioned. Has not been able to get to chair with PT yesterday or day before. Sister Butch Penny concerned that daughter will not be able to provide enough assistance at home. -Continue PT, unavailible today will evaluate later this week, would like for her up be able to get up in chair, - Lantus to 30 units  - SSI - CBG monitoring -Start metformin on discharge  Urinary tract infection -Continue amoxicillin 500 mg Q8H last day of antibiotics  Acute kidney injury from dehydration Kidney function stable -Encouraged PO fluid intake -Monitor BMP  Hypertension Normotensive this morning. Tachy to low 120s likely in setting of beta blocker discontinuation, Asymptomatic in room.  - monitor vitals  Chronic diastolic heart failure  - Hold diuretics, Consider restarting  diuretics if she develops signs offluid overload - Daily weights - I/Os  Macrocytic anemiawith chronic thrombocytopenia Hbg stable. Platelets 88. No signs of active bleeding. -Continue to monitor CBC  Sacral ulcer Right heel ulcer Right toe wound -Prevalon boots -Continue wound care  Dispo: Anticipated discharge to Home in 1 days pending clinical improvement.   Iona Beard, MD 08/06/2020, 12:56 PM Pager: [phone]  Please contact the on call pager after 5 pm and on weekends at [phone].

## 2020-08-06 NOTE — TOC Transition Note (Addendum)
Transition of Care [facility]) - CM/SW Discharge Note   Patient Details  Name: Casey Humphrey MRN: [id number] Date of Birth: 04/19/1945  [facility] CM/SW Contact:  Carles Collet, RN Phone Number: 08/06/2020, 9:33 AM   Clinical Narrative:    09:30 Notified by MD that patient will DC today. Spoke w patient's sister over the phone, verified that she has needed DME at home including hospital bed, WC, hoyer lift, 3/1, RW, rollator.  Verified DC address, family will be expecting her home today, requested hospital pick up at [place] requested for 1pm.  Notified MD that DNR and DC order needs to be completed asap. Nurse aware to verify that DC order in place prior to releasing patient to [facility]. Notified [facility] of DC today for [facility] services. Sister states that she has heard from [facility] already for initiation of services.   12:20 MD wants to see if patient can get to chair prior to discharge.  Transportation cancelled. If no DC today, will consider entering avoidable day.     Final next level of care: [place] Barriers to Discharge: No Barriers Identified   Patient Goals and CMS Choice Patient states their goals for this hospitalization and ongoing recovery are:: Return home CMS Medicare.gov Compare Post Acute Care list provided to:: Patient Represenative (must comment) Choice offered to / list presented to : Sibling  Discharge Placement                       Discharge Plan and Services In-house Referral: Clinical Social Work Discharge Planning Services: CM Consult Post Acute Care Choice: Resumption of Svcs/PTA Provider,Home Health                    HH Arranged: PT,OT [place] Date [facility] Agency Contacted: 08/06/20 Time [place]: [phone] Representative spoke with at [place]: [place] ([place]) Interventions     Readmission Risk Interventions No flowsheet data  found.

## 2020-08-07 DIAGNOSIS — L8990 Pressure ulcer of unspecified site, unspecified stage: Secondary | ICD-10-CM | POA: Insufficient documentation

## 2020-08-07 LAB — GLUCOSE, CAPILLARY
Glucose-Capillary: 241 mg/dL — ABNORMAL HIGH (ref 70–99)
Glucose-Capillary: 261 mg/dL — ABNORMAL HIGH (ref 70–99)
Glucose-Capillary: 236 mg/dL — ABNORMAL HIGH (ref 70–99)
Glucose-Capillary: 234 mg/dL — ABNORMAL HIGH (ref 70–99)

## 2020-08-07 MED ORDER — METFORMIN HCL ER 500 MG PO TB24
500.0000 mg | ORAL_TABLET | Freq: Every day | ORAL | Status: DC
  Administered 2020-08-07 – 2020-08-08 (×2): 500 mg via ORAL
  Filled 2020-08-07 (×2): qty 1

## 2020-08-07 NOTE — Progress Notes (Signed)
Occupational Therapy Treatment Patient Details Name: Casey Humphrey MRN: [id number] DOB: 1945-11-18 Today's Date: 08/07/2020    History of present illness 75yo female admitted 5/16 with AMS, hallucinations and R facial droop. CT/MRI negative for acute CVA. Admitted with acute metabolic and infectious encephalopathy, UTI. PMH anginal pain, Bleeders disease, CHF, CAD, DM with neuropathy, HTN, L knee arthroscopy, MI, CVA   OT comments  Pt making gradual progress towards OT goals this session. Pt continues to present with decreased activity tolerance, impaired strength/ endurance and increased pain. Pt currently requires MAX A for bed mobility with pt able to sit EOB with close supervision. Pt unable to stand despite MAX A +2, pt was able to clear buttocks partly from EOB but unable to fully elevate trunk. Noted pt cannot go to SNF, however feel pt would benefit from intensity of SNF, if pt does go home pt would needed MAX HH support and 24 hr supervision. Will follow acutely per POC.     Follow Up Recommendations  SNF;Supervision/Assistance - 24 hour    Equipment Recommendations  Other (comment) (if d/c home: hospital bed)    Recommendations for Other Services      Precautions / Restrictions Precautions Precautions: Fall Precaution Comments: multiple wounds ( bilateral heels and buttocks) order is in for prevalon boots but have not arrived as of 5/23 Restrictions Weight Bearing Restrictions: No       Mobility Bed Mobility Overal bed mobility: Needs Assistance Bed Mobility: Supine to Sit;Sit to Supine     Supine to sit: Max assist;HOB elevated Sit to supine: Max assist   General bed mobility comments: pt able to initiate maneuvering RLE to EOB but needed MAX A for LLE, cues needed to sequence reaching RUE to bed rail to assist with elevating trunk into sitting but utlimately needed MAX A to elevate trunk and scoot hips to EOB, MAX A to return to supine     Transfers Overall transfer level: Needs assistance Equipment used: Rolling walker (2 wheeled);2 person hand held assist Transfers: Sit to/from Stand Sit to Stand: Max assist;From elevated surface         General transfer comment: pt able to elevate buttock partially off EOB with MAX A +2 but unable to come into full stand    Balance Overall balance assessment: Needs assistance Sitting-balance support: Feet supported;Bilateral upper extremity supported Sitting balance-Leahy Scale: Fair Sitting balance - Comments: close supervision to sit EOB     Standing balance-Leahy Scale: Zero Standing balance comment: unable to come into full stand                           ADL either performed or assessed with clinical judgement   ADL Overall ADL's : Needs assistance/impaired                           Toilet Transfer Details (indicate cue type and reason): unable to pivot : MAX A +2 to elevate buttock off bed partly         Functional mobility during ADLs: Maximal assistance;+2 for physical assistance (unable to fully stand) General ADL Comments: session focus on bed mobility, sitting balance EOB, did attempt sit<>stand but unable to come into full stand despite MAX A +2     Vision       Perception     Praxis      Cognition Arousal/Alertness: Awake/alert Behavior During Therapy: Flat affect  Overall Cognitive Status: No family/caregiver present to determine baseline cognitive functioning Area of Impairment: Safety/judgement;Awareness;Problem solving;Attention                   Current Attention Level: Focused     Safety/Judgement: Decreased awareness of deficits Awareness: Intellectual Problem Solving: Slow processing;Decreased initiation;Difficulty sequencing;Requires verbal cues General Comments: pt flat but agreeable to session, impaired awareness into deficits noted as pt reports she can slide to end of bed once sitting EOB but unable to  do so once sitting EOB, pt reports that at home her daughter would place the slide board under her at home and she would scoot from surface to surface ( unsure of validity of this information) increased time to problem solve mobility tasks such as bed mobility        Exercises General Exercises - Upper Extremity Elbow Flexion: AAROM;Both;10 reps;Supine Elbow Extension: AAROM;Both;10 reps;Supine General Exercises - Lower Extremity Ankle Circles/Pumps: AROM;Both;10 reps;Supine   Shoulder Instructions       General Comments pt with heel wounds, and buttock wounds, RN confirmed that prevalon boots have been ordered    Pertinent Vitals/ Pain       Pain Assessment: Faces Faces Pain Scale: Hurts little more Pain Location: BLES Pain Descriptors / Indicators: Discomfort;Grimacing;Guarding Pain Intervention(s): Limited activity within patient's tolerance;Monitored during session;Repositioned;Other (comment) (floated heels)  Home Living                                          Prior Functioning/Environment              Frequency  Min 2X/week        Progress Toward Goals  OT Goals(current goals can now be found in the care plan section)  Progress towards OT goals: Not progressing toward goals - comment (impaird functional activity tolerance and strength)  Acute Rehab OT Goals Patient Stated Goal: none stated Time For Goal Achievement: 08/16/20 Potential to Achieve Goals: Good  Plan Discharge plan remains appropriate;Frequency remains appropriate    Co-evaluation                 AM-PAC OT "6 Clicks" Daily Activity     Outcome Measure   Help from another person eating meals?: A Lot Help from another person taking care of personal grooming?: A Lot Help from another person toileting, which includes using toliet, bedpan, or urinal?: Total Help from another person bathing (including washing, rinsing, drying)?: Total Help from another person to put on  and taking off regular upper body clothing?: A Lot Help from another person to put on and taking off regular lower body clothing?: Total 6 Click Score: 9    End of Session Equipment Utilized During Treatment: Gait belt;Rolling walker  OT Visit Diagnosis: Other abnormalities of gait and mobility (R26.89);Muscle weakness (generalized) (M62.81);History of falling (Z91.81);Other symptoms and signs involving cognitive function;Pain;Hemiplegia and hemiparesis;Cognitive communication deficit (R41.841);Unsteadiness on feet (R26.81) Pain - Right/Left:  (bilateral) Pain - part of body: Leg   Activity Tolerance Patient tolerated treatment well   Patient Left in bed;with call bell/phone within reach   Nurse Communication Mobility status        Time: 5732-2025 OT Time Calculation (min): 23 min  Charges: OT General Charges $OT Visit: 1 Visit OT Treatments $Self Care/Home Management : 23-37 mins  Harley Alto., COTA/L Acute Rehabilitation Services [phone]    [facility]  Jabier Gauss 08/07/2020, 4:48 PM

## 2020-08-07 NOTE — TOC Progression Note (Addendum)
Transition of Care [facility]) - Progression Note    Patient Details  Name: Casey Humphrey MRN: [id number] Date of Birth: 07/21/1945  [facility] CM/SW Contact  Bartholomew Crews, RN Phone Number: [phone] 08/07/2020, 2:13 PM  Clinical Narrative:     Spoke with patient's sister, Casey Humphrey, on her mobile phone per nursing request to reach out. Reiterated information already provided by CSW (difference between SNF and LTC) and offered opportunity for Casey Humphrey to vent. Casey Humphrey expresses concerns that her sister seems to have given up ever since she broke her leg last year. Patient lives with her daughter and daughter's fiancee. She has hospital bed, hoyer lift, walker, potty seat, reclinier, and wheelchair. [facility] to follow up after discharge - discussed parameters of home health visits, Casey Humphrey verbalized understanding. Demographics verified. TOC following for transition needs.   Expected Discharge Plan: [place] Barriers to Discharge: No Barriers Identified  Expected Discharge Plan and Services Expected Discharge Plan: [place] In-house Referral: Clinical Social Work Discharge Planning Services: CM Consult Post Acute Care Choice: Resumption of Svcs/PTA Provider,Home Health Living arrangements for the past 2 months: [name]: PT,OT [place] Date [place]: 08/06/20 Time [place]: [phone] Representative spoke with at [place]: [place] ([place]) Interventions    Readmission Risk Interventions No flowsheet data found.

## 2020-08-07 NOTE — Consult Note (Addendum)
[place] Nurse wound follow up Patient receiving care in [facility] 5W06 Follow up from [facility] found on right heel during PIP audit. Patient is on an air mattress. Bilateral heels have foam dressings. Right heel is red, measures 4 cm x 5 cm with no drainage. This patient has orders for Prevalon heel lift boot Kellie Simmering # [phone]) in which I did not see in the patient room, nor did she have on her feet. Modified the order for the nurse to order and place on the patient feet. Previous wounds assessed by S. Doty on 08/01/20 with orders placed in the chart.   Monitor the wound area(s) for worsening of condition such as: Signs/symptoms of infection, increase in size, development of or worsening of odor, development of pain, or increased pain at the affected locations.   Notify the medical team if any of these develop.  Thank you for the consult. [place] nurse will not follow at this time.   Please re-consult the [place] team if needed.  Cathlean Marseilles Tamala Julian, MSN, RN, [place], Lysle Pearl, [facility] Wound Treatment Associate Pager [phone]

## 2020-08-07 NOTE — Progress Notes (Signed)
   08/06/20 2032  Assess: MEWS Score  Temp 98.6 F (37 C)  BP 136/65  Pulse Rate (!) 124  ECG Heart Rate (!) 124  Resp 20  Level of Consciousness Alert  SpO2 97 %  O2 Device Room Air  Patient Activity (if Appropriate) In bed  Assess: MEWS Score  MEWS Temp 0  MEWS Systolic 0  MEWS Pulse 2  MEWS RR 0  MEWS LOC 0  MEWS Score 2  MEWS Score Color Yellow  Assess: if the MEWS score is Yellow or Red  Were vital signs taken at a resting state? Yes  Focused Assessment No change from prior assessment  Early Detection of Sepsis Score *See Row Information* Low  MEWS guidelines implemented *See Row Information* No, previously yellow, continue vital signs every 4 hours  Treat  MEWS Interventions Other (Comment) (will continue to monitor)  Pain Scale 0-10  Pain Score 0  Take Vital Signs  Increase Vital Sign Frequency  Yellow: Q 2hr X 2 then Q 4hr X 2, if remains yellow, continue Q 4hrs  Escalate  MEWS: Escalate Yellow: discuss with charge nurse/RN and consider discussing with provider and RRT  Notify: Charge Nurse/RN  Name of Charge Nurse/RN Notified Nikki RN  Date Charge Nurse/RN Notified 08/07/20  Time Charge Nurse/RN Notified 0116  Document  Patient Outcome Other (Comment) (No interventions done, will continue to monitor.)  Progress note created (see row info) Yes

## 2020-08-07 NOTE — Progress Notes (Addendum)
Subjective:  Casey Humphrey is doing well today because her heart rate is not racing anymore. She has not had hallucinations of her parents, and she explains this is because "it takes a long time for them to get here." She denies any suprapubic pain, dysuria, or frequency today. She has not had much of an appetite, and she only ate a couple of bites of her breakfast this morning. She has drank about 2 cups of water since last night.  Objective:  Vital signs in last 24 hours: Vitals:   08/07/20 0030 08/07/20 0346 08/07/20 0749 08/07/20 1208  BP: 136/72 132/63 (!) 118/58 (!) 119/55  Pulse: (!) 115 (!) 105 (!) 102 91  Resp: 19 15 16 15   Temp: 97.8 F (36.6 C) 97.8 F (36.6 C) 97.8 F (36.6 C) 98.3 F (36.8 C)  TempSrc: Oral Oral Oral Oral  SpO2: 96% 95% 95% 96%  Weight:      Height:       Weight change:   Intake/Output Summary (Last 24 hours) at 77/23/2022 1405 Last data filed at 08/07/2020 1320 Gross per 24 hour  Intake 300 ml  Output 350 ml  Net -50 ml   Physical Exam Constitutional:      Comments: Patient is laying in bed with multiple blankets. She is alert and conversant. She is chronically ill-appearing with flat affect. Alert and oriented x2, states it is May 2002.  Cardiovascular:     Rate and Rhythm: Regular rhythm. Tachycardia present.     Heart sounds: Normal heart sounds.  Pulmonary:     Effort: Pulmonary effort is normal.     Comments: Coarse rales in middle and inferior posterior lung fields.  Abdominal:     General: Abdomen is flat. Bowel sounds are decreased. There is no distension.     Palpations: Abdomen is soft.     Tenderness: There is no abdominal tenderness.  Musculoskeletal:     Right lower leg: 1+ Pitting Edema present.     Left lower leg: 1+ Pitting Edema present.     Comments: Effusion on L knee that is not as swollen as previous exams. It is not tender to palpation.  Feet:     Comments: Foam dressings applied to heels bilaterally. Dressing  covering R bulla that has resorbed most of the blood. Overlying tissue intact. Resolving bruises on distal metatarsals. Healing blood blister on R great toe with betadine. Skin:    Comments: Resolving solar purpura on arms bilaterally.    Assessment/Plan:  Principal Problem:   Acute metabolic and infectious encephalopathy Active Problems:   Acute symptomatic hyperglycemia   UTI (urinary tract infection)  Patient Summary: Casey Humphrey a 75y.o.woman with a history of HTN, V4MG, chronic diastolic HF, CAD,andprior CVA on hospital day 7 for management of acute metabolic andinfectious encephalopathysecondary to acute hyperglycemia and urinary tract infection.  1. Acute metabolic and infectious encephalopathy Patient's mentation continues to improve. She is alert and oriented x2, but I believe she may have misspoke when stating the year was 2002 instead of 2022. She has not had any hallucinations, but she seems to think her parents are indeed alive and well but can't make it here. Given overall improvement in mental status, I believe encephalopathy has resolved. She likely has some component of dementia. She is also markedly deconditioned, and PT/OT has had difficulty moving her OOB. This is problematic due to presumed home health placement since insurance will not pay for any more SNF. Blood glucose remains uncontrolled as  well with CBGs in mid-high 200s, even after increasing lantus to 30 units per day. -Follow up PT/OT and any improvement, though not optimistic for large increases in mobility -Continue lantus 30 units daily, novolog ssi -Begin metformin 500 mg once per day -Monitor CBG -Will contact CSW to reassess discharge options  2. Urinary tract infection Patient denies any suprapubic pain, dysuria, urinary frequency. Her urine color remains darker orange, indicating dehydration. Skin turgor decreased over chest but edema present in upper and lower extremities. She finished  amoxicillin 500 mg yesterday. -Monitor symptoms; if recurrence of dysuria, suprapubic pain, or frequency, will get follow up UA  3. Tachycardia Patient feels better now that her heart is not racing. Her pulse is 91 today. She is taking metoprolol succinate 12.5 mg daily. -Continue metoprolol daily  4. Pressure wounds Her blood blister on her right medial calcaneous is resolving. It has resorbed blood. Overlying tissue in intact. Foam dressings are applied to heels bilaterally with Prevalon boots. Her sacral wound was not visualized, but she states that her dressing was changed this morning. -Add betadine to blood blister -Will observe sacral wound tomorrow morning upon next change   LOS: 7 days   Mikal Plane, Medical Student 08/07/2020, 2:05 PM Pager: [phone] After 5pm on weekdays and 1pm on weekends: On Call pager [phone]

## 2020-08-08 LAB — GLUCOSE, CAPILLARY
Glucose-Capillary: 173 mg/dL — ABNORMAL HIGH (ref 70–99)
Glucose-Capillary: 177 mg/dL — ABNORMAL HIGH (ref 70–99)
Glucose-Capillary: 194 mg/dL — ABNORMAL HIGH (ref 70–99)

## 2020-08-08 LAB — CBC
HCT: 29.4 % — ABNORMAL LOW (ref 36.0–46.0)
Hemoglobin: 9.7 g/dL — ABNORMAL LOW (ref 12.0–15.0)
MCH: 36.7 pg — ABNORMAL HIGH (ref 26.0–34.0)
MCHC: 33 g/dL (ref 30.0–36.0)
MCV: 111.4 fL — ABNORMAL HIGH (ref 80.0–100.0)
Platelets: 86 10*3/uL — ABNORMAL LOW (ref 150–400)
RBC: 2.64 MIL/uL — ABNORMAL LOW (ref 3.87–5.11)
RDW: 15.2 % (ref 11.5–15.5)
WBC: 6.9 10*3/uL (ref 4.0–10.5)
nRBC: 0 % (ref 0.0–0.2)

## 2020-08-08 MED ORDER — ACCU-CHEK GUIDE W/DEVICE KIT: 1.0000 [IU] | PACK | Freq: Every day | 0 refills | Status: AC

## 2020-08-08 MED ORDER — ACCU-CHEK SOFTCLIX LANCETS MISC: 1.0000 | Freq: Every day | 0 refills | Status: AC

## 2020-08-08 MED ORDER — ENSURE MAX PROTEIN PO LIQD: 11.0000 [oz_av] | Freq: Every day | ORAL | 0 refills | Status: AC

## 2020-08-08 MED ORDER — ACCU-CHEK GUIDE VI STRP: ORAL_STRIP | 0 refills | Status: AC

## 2020-08-08 MED ORDER — METFORMIN HCL 500 MG PO TABS: 500.0000 mg | ORAL_TABLET | Freq: Two times a day (BID) | ORAL | 0 refills | Status: AC

## 2020-08-08 MED ORDER — METOPROLOL SUCCINATE ER 25 MG PO TB24: 12.5000 mg | ORAL_TABLET | Freq: Every day | ORAL | 0 refills | Status: AC

## 2020-08-08 MED ORDER — GLUCERNA SHAKE PO LIQD: 237.0000 mL | Freq: Two times a day (BID) | ORAL | 0 refills | Status: AC

## 2020-08-08 NOTE — Discharge Summary (Addendum)
Name: Casey Humphrey MRN: [id number] DOB: 1946-02-08 75 y.o. PCP: Angelina Sheriff, MD  Date of Admission: 07/31/2020  9:14 AM Date of Discharge: 08/08/2020 Attending Physician: Angelica Pou, MD  Discharge Diagnosis: 1. Acute metabolic and infectious encephalopathy 2/2 to hyperglycemia and UTI 2. Urinary tract infection 3. Acute kidney injury due to dehydration in setting of HHS 4. Pressure wounds on sacrum, R calcaneous, and R posterior thigh 5. Reflex tachycardia 6. Right sphenoid sinus opacification   Discharge Medications: Allergies as of 08/08/2020       Reactions   Codeine Nausea And Vomiting   Quinine Derivatives Nausea And Vomiting   Sulfa Antibiotics Nausea And Vomiting, Other (See Comments)   "gets real sick" per family   Adhesive [tape] Rash   Penicillins Rash   Procardia [nifedipine] Rash        Medication List     STOP taking these medications    furosemide 40 MG tablet Commonly known as: LASIX   nitroGLYCERIN 0.4 MG SL tablet Commonly known as: NITROSTAT   propranolol 20 MG tablet Commonly known as: INDERAL   spironolactone 25 MG tablet Commonly known as: ALDACTONE   torsemide 20 MG tablet Commonly known as: DEMADEX   traMADol 50 MG tablet Commonly known as: ULTRAM       TAKE these medications    Accu-Chek Guide test strip Generic drug: glucose blood Use once a daily to check blood sugar in morning before meals   Accu-Chek Guide w/Device Kit 1 Units by Does not apply route daily before breakfast.   Accu-Chek Softclix Lancets lancets 1 each by Other route daily. Use as directed.   albuterol 108 (90 Base) MCG/ACT inhaler Commonly known as: VENTOLIN HFA Inhale 2 puffs into the lungs every 6 (six) hours as needed for wheezing or shortness of breath.   aspirin EC 81 MG tablet Take 81 mg by mouth daily.   AZO Bladder Control/Go-Less Caps Take 1 capsule by mouth daily as needed (infection).   Decubi-Vite  Caps Take 1 capsule by mouth daily.   Dermacloud Oint Apply 1 application topically daily as needed (sores).   diclofenac Sodium 1 % Gel Commonly known as: VOLTAREN Apply 2 g topically 2 (two) times daily.   Ensure Max Protein Liqd Take 330 mLs (11 oz total) by mouth at bedtime for 15 days.   feeding supplement (GLUCERNA SHAKE) Liqd Take 237 mLs by mouth 2 (two) times daily between meals for 15 days.   Fiber-Lax 625 MG tablet Generic drug: polycarbophil Take 1 tablet by mouth daily.   fluticasone 50 MCG/ACT nasal spray Commonly known as: FLONASE Place 2 sprays into both nostrils daily.   ipratropium 17 MCG/ACT inhaler Commonly known as: ATROVENT HFA Inhale 2 puffs into the lungs every 6 (six) hours.   metFORMIN 500 MG tablet Commonly known as: GLUCOPHAGE Take 1 tablet (500 mg total) by mouth 2 (two) times daily with a meal.   metoprolol succinate 25 MG 24 hr tablet Commonly known as: TOPROL-XL Take 0.5 tablets (12.5 mg total) by mouth daily. Start taking on: Aug 09, 2020   Synthroid 100 MCG tablet Generic drug: levothyroxine Take 100 mcg by mouth daily.   Visine Dry Eye Relief 1 % Soln Generic drug: Polyethylene Glycol 400 Place 1 drop into both eyes daily.   Vitamin D (Ergocalciferol) 1.25 MG (50000 UNIT) Caps capsule Commonly known as: DRISDOL Take 50,000 Units by mouth 2 (two) times a week. Sundays and Wednesdays  Discharge Care Instructions  (From admission, onward)           Start     Ordered   08/08/20 0000  Discharge wound care:       Comments: Apply Xeroform gauze to the bullae on the right hip. Cover with foam dressing. Change daily. Please paint the blood blister on her R great toe with betadine and allow to air dry. Apply daily Please keep heels up off the bed to prevent pressure ulcers   08/08/20 1537           Disposition and follow-up:   [name] was discharged from [facility]  in Stable condition.  At the hospital follow up visit please address:   1.  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   Diabetes -Elevated A1c of 10.2 appears recurrent -Started on metformin 500 mg bid, please assess glucose and adjust medication as needed   Heart failure, hypertension - diuretics and propanolol held due to normotension and dehydration in setting of HHS -please evaluate volume status and restart diuretics as needed - Propranolol resumed with normotension, resulted in DBP in 40s, propanolol discontinued - HR in low 120s after discontinuation of propanolol - Started on metoprolol, pulse at discharge is 90 and BP 101/52  AKI -resolved by discharge, assess PO intake   Pressure injuries - Please evaluate multiple pressure and friction related injuries for healing and infection, please assess if patient is receiving wound care and family able to assist with this   Right forehead nodule - concerning for malignancy, would benefit from biopsy   Right sphenoid sinus opacification  -asymptomatic, not on treatment, needs ENT follow up   History of left knee fracture s/p ORIF - Persistent deformity on imaging and effussion, likely in setting of severe osteoporosis. Unlikely to be improved by further surgical intervention, recommend supportive care.    2.  Labs / imaging needed at time of follow-up: CBC, BMP   3.  Pending labs/ test needing follow-up: none   Follow-up Appointments:       Follow-up Information         Angelina Sheriff, MD Follow up.   Specialty: Family Medicine Contact information: [place] [id number] [place] Hospital Course by problem list:  Acute metabolic and infectious encephalopathy Patient presenting  with declining health for the past 6 months. Recently hospitalized for pneumonia before discharge to SNF in April. Recently discharged home. Has had worsening hallucination of decreased loved ones and birds. Found to have serum blood glucose greater than  900 without acidosis. Admitted for HHS. Was discontinued off metformin 5 years ago by PCP and never rechecked per family. A1c 10.2 this admission. Started on insulin infusion and fluid with improvement. Transitioned to Lantus 30 units daily, Novolog sliding scale, and metformin 500 mg daily. At discharge, metformin was given 500 mg twice daily to avoid logistics with insulin at home. Will need follow up with PCP to assess blood glucose and adjust medications as necessary. Also found to have UTI and started on antibiotics. Was noted to have a pill rolling termor intermittently as well as stereotyped movements. Concerning for underlying dementia with Lewy bodies vs other movement disorder with  symptoms execrated in setting of acute hyperglycemia. Mental status improved during admission. No further hallucinations during admission.    Urinary tract infection Patient presenting with burning with urination started empirically on ceftriaxone. Urine cultures growing E. Faecalis switched to IV ampicillin and then completed course on oral amoxacillin. Consider follow up colonoscopy as outpatient.    Acute kidney injury due to dehydration in setting of HHS Patient presented with elevated Cr of 1.68 in setting of dehydration due to HHS, continue diuretics, and decreased oral intake in setting of metabolic encephalopathy. Improved with IV fluids. Recent creatinine 0.88. Decreased skin turgor noted on chest with edema of the extremities. Counseled on consistent PO intake of food and water.   Chronic diastolic heart failure  Hypertension Diuretics held in setting of dehydration due to HHS. BPs have been soft and propranolol discontinued. HR elevated to 110-120 without  symptoms. Likely represented rebound from beta blocker discontinuation. Metoprolol succinate 12.5 mg daily started with pulse 90 and BP 101/52 on discharge.   Sacral wound Right calcaneal wound Patient presenting with stage 2 decubitus ulcer and right great toe. Family reports right foot was stepped prior to admission. Health has been declining since the beginning of this year. Significant decondition and has been mostly wheelchair and bed bound in the past several months. Wound care continued during admission with application of betadine to areas which showed improvement. Started on nutritional supplements for low albumin and poor nutritional status. Unfortunately patient has used up all her SNF days. Plan to discharge home with [facility] PT, OT, and RN.    Right sphenoid sinus opacification  Incidental finding of complete opacification of the right sphenoid sinus on MRI suggestive for fungal sinusitis.  Patient with history of bleeding disorder, with anemia and thrombocytopenia high risk for invasive fungal infection such as Aspergillus or mucormycosis in setting of acute hyperglycemia with serum glucose greater than 900. Though there were no reports of facial pain or tenderness. ENT consulted, given no erosion of the bone or vision loss not emerge surgical intervention indicated. Will need outpatient follow up.    Macrocytic anemia Thrombocytopenia Patient with history of unknown bleeding disorder with macrocytic anemia and thrombocytopenia on admission. Did have significant amount of ecchymosis of the upper extremities and scatter across face and lower extremities that improved significantly over course of her stay. CBC remained stable during admission. B12 normal. Blood smear with macrocytic anemia and anisocytosis and platelet clumping. Platelet remained low with citrate tube. Concerning for underlying bone marrow suppression. Consider outpatient hematology follow up.    Right forehead nodule Noted to  have a large 2.5 cm raise, rough, scaly nodule of the right forehead. Family reports this was seen by a dermatologist PA several years ago and treated as eczema with steroid cream with helped. No biopsy was taken at that time. Notes it has gotten a bit larger recently as patient refusing to have cream applied. Given appearance and persistence of this lesion concern for underlying dermatologic malignancy. Recommend follow up with dermatology and possible biopsy to rule out underlying malignancy.  Progress Note 08/08/2020 Patient reports improved heel pain. She says that her deceased parents are "coming" to see her. Her mentation is otherwise stable. She has been urinating and having bowel movements without discomfort. She is amenable to continue increasing her food and water intake to ensure she does not get dehydrated again.  Discharge Exam:   BP (!) 101/52 (BP Location: Right Arm)   Pulse 90   Temp 97.6 F (  36.4 C) (Oral)   Resp 19   Ht '5\' 2"'  (1.575 m)   Wt 112.9 kg   SpO2 98%   BMI 45.54 kg/m   Physical Exam Constitutional:      Comments: Patient is laying in bed, alert, conversant, and in NAD. She demonstrates a flat affect and answers questions in small sentences.  Cardiovascular:     Rate and Rhythm: Normal rate and regular rhythm.     Heart sounds: Normal heart sounds.     Comments: Bilateral, mild pitting edema of legs bilaterally. Third spacing of fluid in feet. Pulmonary:     Effort: Pulmonary effort is normal.     Comments: Clear to auscultation in anterior lung fields and superior posterior lung fields. Coarse crackles appreciated in middle and inferior posterior lung fields. Abdominal:     General: Abdomen is flat. Bowel sounds are decreased.     Palpations: Abdomen is soft.     Tenderness: There is no abdominal tenderness.     Comments: There is a large bruise over right flank that extends to right lower quadrant.  Musculoskeletal:     Comments: L knee is less swollen,  painful to palpation over patellar tendon.  Feet:     Comments: Healing blood blister with betadine on R great toe. Foam dressings on heels bilaterally. Blood blister on R calcaneous demonstrates little fluid with overlying skin intact. Skin:    Comments: Bruises on upper extremities improving. Older bruises noted on L upper anterior thigh. Pressure wound noted on midline sacrum. Flaccid bullae noted on right posterior thigh with some unroofed lesions.  Neurological:     Mental Status: She is alert.     Sensory: Sensation is intact.     Comments: Oriented to person, place. Time is stated as 07/2000 instead of 07/2020.        Pertinent Labs, Studies, and Procedures:   Recent Results (from the past 2160 hour(s))  Protime-INR     Status: None   Collection Time: 07/31/20  9:15 AM  Result Value Ref Range   Prothrombin Time 14.9 11.4 - 15.2 seconds   INR 1.2 0.8 - 1.2    Comment: (NOTE) INR goal varies based on device and disease states. Performed at [facility], [place] [address], [place], [place] [address]   APTT     Status: None   Collection Time: 07/31/20  9:15 AM  Result Value Ref Range   aPTT 25 24 - 36 seconds    Comment: Performed at [facility] [address] [place], [place] [address]  CBC     Status: Abnormal   Collection Time: 07/31/20  9:15 AM  Result Value Ref Range   WBC 8.2 4.0 - 10.5 K/uL   RBC 2.90 (L) 3.87 - 5.11 MIL/uL   Hemoglobin 10.7 (L) 12.0 - 15.0 g/dL   HCT 32.7 (L) 36.0 - 46.0 %   MCV 112.8 (H) 80.0 - 100.0 fL   MCH 36.9 (H) 26.0 - 34.0 pg   MCHC 32.7 30.0 - 36.0 g/dL   RDW 14.7 11.5 - 15.5 %   Platelets 137 (L) 150 - 400 K/uL   nRBC 0.0 0.0 - 0.2 %    Comment: Performed at [facility], [place] [address] [place], [place] [address]  Differential     Status: None   Collection Time: 07/31/20  9:15 AM  Result Value Ref Range   Neutrophils Relative % 80 %   Neutro Abs 6.5  1.7 - 7.7 K/uL   Lymphocytes Relative 10 %   Lymphs Abs 0.8  0.7 - 4.0 K/uL   Monocytes Relative 7 %   Monocytes Absolute 0.6 0.1 - 1.0 K/uL   Eosinophils Relative 2 %   Eosinophils Absolute 0.1 0.0 - 0.5 K/uL   Basophils Relative 0 %   Basophils Absolute 0.0 0.0 - 0.1 K/uL   Immature Granulocytes 1 %   Abs Immature Granulocytes 0.05 0.00 - 0.07 K/uL    Comment: Performed at [facility] [address] [place], [place] [address]  Comprehensive metabolic panel     Status: Abnormal   Collection Time: 07/31/20  9:15 AM  Result Value Ref Range   Sodium 130 (L) 135 - 145 mmol/L   Potassium 4.5 3.5 - 5.1 mmol/L   Chloride 93 (L) 98 - 111 mmol/L   CO2 27 22 - 32 mmol/L   Glucose, Bld 908 (HH) 70 - 99 mg/dL    Comment: Glucose reference range applies only to samples taken after fasting for at least 8 hours. CRITICAL RESULT CALLED TO, READ BACK BY AND VERIFIED WITH: B.BECK,RN [id number] 07/31/20 CLARK,S    BUN 24 (H) 8 - 23 mg/dL   Creatinine, Ser 1.68 (H) 0.44 - 1.00 mg/dL   Calcium 10.4 (H) 8.9 - 10.3 mg/dL   Total Protein 6.2 (L) 6.5 - 8.1 g/dL   Albumin 2.3 (L) 3.5 - 5.0 g/dL   AST 37 15 - 41 U/L   ALT 28 0 - 44 U/L   Alkaline Phosphatase 110 38 - 126 U/L   Total Bilirubin 1.6 (H) 0.3 - 1.2 mg/dL   GFR, Estimated 31 (L) >60 mL/min    Comment: (NOTE) Calculated using the CKD-EPI Creatinine Equation (2021)    Anion gap 10 5 - 15    Comment: Performed at [facility], [place] [address], [place], [place] [address]  CBG monitoring, ED     Status: Abnormal   Collection Time: 07/31/20  9:16 AM  Result Value Ref Range   Glucose-Capillary >600 (HH) 70 - 99 mg/dL    Comment: Glucose reference range applies only to samples taken after fasting for at least 8 hours.  Beta-hydroxybutyric acid     Status: None   Collection Time: 07/31/20  9:23 AM  Result Value Ref Range   Beta-Hydroxybutyric Acid 0.19 0.05 - 0.27 mmol/L    Comment: Performed at [facility] [address] [place], [place] [address]  I-Stat venous blood gas, ED      Status: Abnormal   Collection Time: 07/31/20  9:28 AM  Result Value Ref Range   pH, Ven 7.370 7.250 - 7.430   pCO2, Ven 50.3 44.0 - 60.0 mmHg   pO2, Ven 35.0 32.0 - 45.0 mmHg   Bicarbonate 29.1 (H) 20.0 - 28.0 mmol/L   TCO2 31 22 - 32 mmol/L   O2 Saturation 64.0 %   Acid-Base Excess 3.0 (H) 0.0 - 2.0 mmol/L   Sodium 132 (L) 135 - 145 mmol/L   Potassium 4.2 3.5 - 5.1 mmol/L   Calcium, Ion 1.39 1.15 - 1.40 mmol/L   HCT 32.0 (L) 36.0 - 46.0 %   Hemoglobin 10.9 (L) 12.0 - 15.0 g/dL   Sample type VENOUS    Comment NOTIFIED PHYSICIAN   I-stat chem 8, ED     Status: Abnormal   Collection Time: 07/31/20  9:29 AM  Result Value Ref Range   Sodium 131 (L) 135 - 145 mmol/L  Potassium 4.2 3.5 - 5.1 mmol/L   Chloride 94 (L) 98 - 111 mmol/L   BUN 28 (H) 8 - 23 mg/dL   Creatinine, Ser 1.40 (H) 0.44 - 1.00 mg/dL   Glucose, Bld >700 (HH) 70 - 99 mg/dL    Comment: Glucose reference range applies only to samples taken after fasting for at least 8 hours.   Calcium, Ion 1.36 1.15 - 1.40 mmol/L   TCO2 27 22 - 32 mmol/L   Hemoglobin 10.5 (L) 12.0 - 15.0 g/dL   HCT 31.0 (L) 36.0 - 46.0 %   Comment NOTIFIED PHYSICIAN   Urinalysis, Routine w reflex microscopic     Status: Abnormal   Collection Time: 07/31/20 10:05 AM  Result Value Ref Range   Color, Urine YELLOW YELLOW   APPearance CLOUDY (A) CLEAR   Specific Gravity, Urine 1.020 1.005 - 1.030   pH 5.0 5.0 - 8.0   Glucose, UA >=500 (A) NEGATIVE mg/dL   Hgb urine dipstick SMALL (A) NEGATIVE   Bilirubin Urine NEGATIVE NEGATIVE   Ketones, ur NEGATIVE NEGATIVE mg/dL   Protein, ur NEGATIVE NEGATIVE mg/dL   Nitrite NEGATIVE NEGATIVE   Leukocytes,Ua LARGE (A) NEGATIVE   RBC / HPF 11-20 0 - 5 RBC/hpf   WBC, UA 21-50 0 - 5 WBC/hpf   Bacteria, UA MANY (A) NONE SEEN   Squamous Epithelial / LPF 0-5 0 - 5   Budding Yeast PRESENT     Comment: Performed at [facility], [address], [place], [place] [address]  Urine culture     Status: Abnormal    Collection Time: 07/31/20 11:03 AM   Specimen: Urine, Random  Result Value Ref Range   Specimen Description URINE, RANDOM    Special Requests      NONE Performed at [facility], [place] [address], [place], [place] [address]    Culture 70,000 COLONIES/mL ENTEROCOCCUS FAECALIS (A)    Report Status 08/02/2020 FINAL    Organism ID, Bacteria ENTEROCOCCUS FAECALIS (A)   CBG monitoring, ED     Status: Abnormal   Collection Time: 07/31/20 11:49 AM  Result Value Ref Range   Glucose-Capillary >600 (HH) 70 - 99 mg/dL    Comment: Glucose reference range applies only to samples taken after fasting for at least 8 hours.   Comment 1 Notify RN    Comment 2 Document in Chart   Vitamin B12     Status: None   Collection Time: 07/31/20 12:12 PM  Result Value Ref Range   Vitamin B-12 848 180 - 914 pg/mL    Comment: (NOTE) This assay is not validated for testing neonatal or myeloproliferative syndrome specimens for Vitamin B12 levels. Performed at [facility], [place] [address] [place], [place] [address]   Pathologist smear review     Status: None   Collection Time: 07/31/20 12:12 PM  Result Value Ref Range   Path Review      Macrocytic anemia with mild anisocytosis. Mild thrombocytopenia.    Comment: Reviewed by Jolene Provost, MD, PhD 08/01/2020. Performed at [facility], [place] [address], [place], [place] [address]   TSH     Status: Abnormal   Collection Time: 07/31/20 12:44 PM  Result Value Ref Range   TSH 0.289 (L) 0.350 - 4.500 uIU/mL    Comment: Performed by a 3rd Generation assay with a functional sensitivity of <=0.01 uIU/mL. Performed at [facility], [place] [address], [place], [place] [address]  Hemoglobin A1c     Status: Abnormal   Collection Time: 07/31/20 12:44 PM  Result Value Ref Range   Hgb A1c MFr Bld 10.2 (H) 4.8 - 5.6 %    Comment: (NOTE)         Prediabetes: 5.7 - 6.4         Diabetes: >6.4         Glycemic control for adults with diabetes:  <7.0    Mean Plasma Glucose 246 mg/dL    Comment: (NOTE) Performed At: [facility] [address] [place], [place] [id number] [place] Farmer MD [id number]   CBG monitoring, ED     Status: Abnormal   Collection Time: 07/31/20  2:00 PM  Result Value Ref Range   Glucose-Capillary >600 (HH) 70 - 99 mg/dL    Comment: Glucose reference range applies only to samples taken after fasting for at least 8 hours.  Osmolality     Status: Abnormal   Collection Time: 07/31/20  2:21 PM  Result Value Ref Range   Osmolality 319 (H) 275 - 295 mOsm/kg    Comment: Performed at [facility], [place] [address] [place], [place] [address]  Folate, serum, performed at [facility] lab     Status: None   Collection Time: 07/31/20  2:22 PM  Result Value Ref Range   Folate 9.4 >5.9 ng/mL    Comment: Performed at [facility], [place] [address] [place], [place] [address]  Creatinine, urine, random     Status: None   Collection Time: 07/31/20  2:22 PM  Result Value Ref Range   Creatinine, Urine 27.02 mg/dL    Comment: Performed at [facility], [place] [address], [place], [place] [address]  Sodium, urine, random     Status: None   Collection Time: 07/31/20  2:22 PM  Result Value Ref Range   Sodium, Ur 26 mmol/L    Comment: Performed at [facility] [address] [place], [place] [address]  Basic metabolic panel     Status: Abnormal   Collection Time: 08/06/20  5:37 AM  Result Value Ref Range   Sodium 132 (L) 135 - 145 mmol/L   Potassium 4.5 3.5 - 5.1 mmol/L   Chloride 102 98 - 111 mmol/L   CO2 24 22 - 32 mmol/L   Glucose, Bld 206 (H) 70 - 99 mg/dL    Comment: Glucose reference range applies only to samples taken after fasting for at least 8 hours.   BUN 20 8 - 23 mg/dL   Creatinine, Ser 0.88 0.44 - 1.00 mg/dL   Calcium 9.7 8.9 - 10.3 mg/dL   GFR, Estimated >60 >60 mL/min    Comment: (NOTE) Calculated using the CKD-EPI Creatinine Equation (2021)    Anion gap 6 5 - 15     Comment: Performed at [facility] [address] [place], [place] [address]  CBC     Status: Abnormal   Collection Time: 08/08/20  1:17 AM  Result Value Ref Range   WBC 6.9 4.0 - 10.5 K/uL   RBC 2.64 (L) 3.87 - 5.11 MIL/uL   Hemoglobin 9.7 (L) 12.0 - 15.0 g/dL   HCT 29.4 (L) 36.0 - 46.0 %   MCV 111.4 (H) 80.0 - 100.0 fL   MCH 36.7 (H) 26.0 - 34.0 pg   MCHC 33.0 30.0 - 36.0 g/dL   RDW 15.2 11.5 - 15.5 %   Platelets 86 (L) 150 - 400 K/uL  Comment: Immature Platelet Fraction may be clinically indicated, consider ordering this additional test [id number]    nRBC 0.0 0.0 - 0.2 %    Comment: Performed at [facility], [place] [address], [place], [place] [address]  Glucose, capillary     Status: Abnormal   Collection Time: 08/08/20  7:37 AM  Result Value Ref Range   Glucose-Capillary 173 (H) 70 - 99 mg/dL    Comment: Glucose reference range applies only to samples taken after fasting for at least 8 hours.  Glucose, capillary     Status: Abnormal   Collection Time: 08/08/20 11:37 AM  Result Value Ref Range   Glucose-Capillary 177 (H) 70 - 99 mg/dL    Comment: Glucose reference range applies only to samples taken after fasting for at least 8 hours.   CT head wo contrast: IMPRESSION: 1. No acute intracranial abnormality 2. ASPECTS is 10 3. Extensive expansile opacification and hyperdensity throughout the right sphenoid sinus. This may be due to sinusitis with superimposed fungal infection versus mass lesion. ENT consultation recommended. 4. Code stroke imaging results were communicated on 07/31/2020 at 9:31 am to provider Rory Percy via text page  DG chest IMPRESSION: Left base atelectasis or infiltrate.   MRI brain wo contrast IMPRESSION: 1. No evidence of acute intracranial abnormality. Specifically, no acute infarct. 2. Complete opacification of an expanded right sphenoid sinus with findings suggestive of fungal sinusitis, as detailed above.  Discharge  Instructions: Discharge Instructions     Diet - low sodium heart healthy   Complete by: As directed    Discharge wound care:   Complete by: As directed    Apply Xeroform gauze to the bullae on the right hip. Cover with foam dressing. Change daily. Please paint the blood blister on her R great toe with betadine and allow to air dry. Apply daily Please keep heels up off the bed to prevent pressure ulcers   Face-to-face encounter (required for Medicare/Medicaid patients)   Complete by: As directed    I Iona Beard certify that this patient is under my care and that I, or a nurse practitioner or physician's assistant working with me, had a face-to-face encounter that meets the physician face-to-face encounter requirements with this patient on 08/06/2020. The encounter with the patient was in whole, or in part for the following medical condition(s) which is the primary reason for home health care (List medical condition): Chronic decondition, multiple pressure ulcer, prior left knee fracture limiting mobility, underlying dementia with overall decline in cognitive and functional status   The encounter with the patient was in whole, or in part, for the following medical condition, which is the primary reason for home health care: pressure injury   I certify that, based on my findings, the following services are medically necessary home health services:  Nursing Physical therapy     Reason for Medically Necessary Home Health Services: Skilled Nursing- Complex Wound Care   My clinical findings support the need for the above services: Cognitive impairments, dementia, or mental confusion  that make it unsafe to leave home   Further, I certify that my clinical findings support that this patient is homebound due to: Pain interferes with ambulation/mobility   Home Health   Complete by: As directed    To provide the following care/treatments:  PT OT RN Home Health Aide     Increase activity slowly    Complete by: As directed      Ms. Fetterolf, it was such a pleasure getting  to know you and care for you. You were seen in the hospital for high blood sugars and a urinary tract infection that caused you to have some neurological problems that we call acute encephalopathy. Your blood sugar was brought down with insulin, and you have received different diabetes medications to help it get a little lower. You finished all of your antibiotics for your urinary tract infection and no longer have symptoms. You did have some reversible injury to your kidneys due to being so dehydrated from your high blood sugars, but this has resolved as well with fluids and you drinking water here in the hospital. You also had some pressure wounds on your bottom and feet from the pressure of the bed. We have placed dressings on these wounds to help them heal and not have as much pressure or friction on the bed. In addition, you experienced some racing heart beat after we stopped one of your heart medications due to low blood pressure, but we placed you on a new medication and that has improved your heart rate and blood pressure. Here are your discharge instructions: 1. Continue taking your diabetes medication: metformin 500 mg at breakfast and 500 mg at dinner. 2. Continue taking your medication for your heart rate: metoprolol succinate 12.5 mg daily. Be sure to cut the 25 mg tablet in half with your pill cutter. 3. Stop taking your blood pressure medications: lasix, spironolactone, and propanolol. 4. Continue trying to drink as much water as you can. This is very important to ensure you do not get dehydrated again. 5. Be sure to follow up with home health about your pressure wounds on the back of your right thigh, your bottom, and your right foot to ensure they heal better. You will be sent with wound care supplies. Keep heels off the bed to help prevent pressure ulcer, and turn in the bed to help with healing of the sacral wound.   6. Work with physical therapy and occupational therapy at home to try and get stronger. 7. Please make a follow up appointment with your primary care provider as soon as possible to reevaluate and adjust your medications as needed.  Signed: Mikal Plane, Medical Student 08/08/2020, 3:44 PM   Pager: [phone] After 5pm on weekdays and 1pm on weekends: On Call pager [phone]  Attestation for Student Documentation:  I personally was present and performed or re-performed the history, physical exam and medical decision-making activities of this service and have verified that the service and findings are accurately documented in the student's note.  Iona Beard, MD 08/08/2020, 3:51 PM

## 2020-08-08 NOTE — TOC Progression Note (Signed)
Transition of Care [facility]) - Progression Note    Patient Details  Name: Casey Humphrey MRN: [id number] Date of Birth: 1945-04-08  [facility] CM/SW Contact  Carles Collet, RN Phone Number: 08/08/2020, 11:22 AM  Clinical Narrative:    Casey Humphrey w patient's sister Casey Humphrey (caregiver) and daughter at bedside. In summary, patient is bedbound, lives with daughter Casey Humphrey who provides supervision, is of short stature, may be mildly disabled, does not drive. Casey Humphrey lives 5 miles away, and identifies herself as the caregiver for both Casey Humphrey and the patient for the past 4 years. Prior to admission they utilized hospital bed, DME, hoyer lift WC 3/1, bedpan. Patient would be in copay days at [facility], and custodial. Casey Humphrey understands options for discharge are limited, (family unable to cover out of pocket needed for SNF), and is agreeable to resuming Casey Humphrey services, and repeatedly Medical sales representative for assistance.  Per Dr Lisabeth Devoid, resident, team is is still evaluating for safe DC plan.  Requested physician advisor, Dr Algis Liming to review.      Expected Discharge Plan: [place] Barriers to Discharge: No Barriers Identified  Expected Discharge Plan and Services Expected Discharge Plan: [place] In-house Referral: Clinical Social Work Discharge Planning Services: CM Consult Post Acute Care Choice: Resumption of Svcs/PTA Provider,Home Health Living arrangements for the past 2 months: [place]: PT,OT [place] Date [place]: 08/06/20 Time [place]: [phone] Representative spoke with at [place]: [place] ([place]) Interventions    Readmission Risk Interventions No flowsheet data found.

## 2020-08-08 NOTE — Progress Notes (Signed)
EMS arrived to take patient home.  Patient remains alert and oriented, in stable condition, No sign of distress noted.  Patient left the unit at exactly 1955.

## 2020-08-08 NOTE — Discharge Instructions (Addendum)
[name], it was such a pleasure getting to know you and care for you. You were seen in the hospital for high blood sugars and a urinary tract infection that caused you to have some neurological problems that we call acute encephalopathy. Your blood sugar was brought down with insulin, and you have received more diabetes medications to help it get a little lower. You finished all of your antibiotics for your urinary tract infection and no longer have symptoms. You did have some reversible injury to your kidneys due to being so dehydrated from your high blood sugars, but this has resolved as well with fluids and you drinking water here in the hospital. You also had some pressure wounds on your bottom and feet from the pressure of the bed. We have placed dressings on these wounds to help them heal and not have as much pressure or friction on the bed. In addition, you experienced some racing heart beat after we stopped one of your heart medications due to low blood pressure, but we placed you on a new medication and that has improved your heart rate and blood pressure.   Here are your discharge instructions: 1. Continue taking your diabetes medications: metformin 500 mg at breakfast and 500 mg at dinner. 2. Continue taking your medication for your heart rate: metoprolol succinate 12.5 mg daily. Be sure to cut the 25 mg tablet in half with your pill cutter. 3. Stop taking your blood pressure medications: lasix, spironolactone, and propanolol. 4. Continue trying to drink as much water as you can. This is very important to ensure you do not get dehydrated again. 5. Be sure to follow up with home health about your pressure wounds on the back of your right thigh, your bottom, and your right foot to ensure they heal better. You will be sent with wound care supplies. Keep heels off the bed to help prevent pressure ulcer, and turn in the bed to help with healing of the sacral wound.  6. Work with physical therapy and  occupational therapy at home to try and get stronger. 7. Please make a follow up appointment with your primary care provider as soon as possible to reevaluate and adjust your medications as needed.  Pressure Injury  A pressure injury is damage to the skin and underlying tissue that results from pressure being applied to an area of the body. It often affects people who must spend a long time in a bed or chair because of a medical condition. Pressure injuries usually occur:  Over bony parts of the body, such as the tailbone, shoulders, elbows, hips, heels, spine, ankles, and back of the head.  Under medical devices that make contact with the body, such as respiratory equipment, stockings, tubes, and splints. Pressure injuries start as reddened areas on the skin and can lead to pain and an open wound. What are the causes? This condition is caused by frequent or constant pressure to an area of the body. Decreased blood flow to the skin can eventually cause the skin tissue to die and break down, causing a wound. What increases the risk? You are more likely to develop this condition if you:  Are in the hospital or an extended care facility.  Are bedridden or in a wheelchair.  Have an injury or disease that keeps you from: ? Moving normally. ? Feeling pain or pressure.  Have a condition that: ? Makes you sleepy or less alert. ? Causes poor blood flow.  Need to wear  a medical device.  Have poor control of your bladder or bowel functions (incontinence).  Have poor nutrition (malnutrition). If you are at risk for pressure injuries, your health care provider may recommend certain types of mattresses, mattress covers, pillows, cushions, or boots to help prevent them. These may include products filled with air, foam, gel, or sand. What are the signs or symptoms? Symptoms of this condition depend on the severity of the injury. Symptoms may include:  Red or dark areas of the skin.  Pain,  warmth, or a change of skin texture.  Blisters.  An open wound. How is this diagnosed? This condition is diagnosed with a medical history and physical exam. You may also have tests, such as:  Blood tests.  Imaging tests.  Blood flow tests. Your pressure injury will be staged based on its severity. Staging is based on:  The depth of the tissue injury, including whether there is exposure of muscle, bone, or tendon.  The cause of the pressure injury. How is this treated? This condition may be treated by:  Relieving or redistributing pressure on your skin. This includes: ? Frequently changing your position. ? Avoiding positions that caused the wound or that can make the wound worse. ? Using specific bed mattresses, chair cushions, or protective boots. ? Moving medical devices from an area of pressure, or placing padding between the skin and the device. ? Using foams, creams, or powders to prevent rubbing (friction) on the skin.  Keeping your skin clean and dry. This may include using a skin cleanser or skin barrier as told by your health care provider.  Cleaning your injury and removing any dead tissue from the wound (debridement).  Placing a bandage (dressing) over your injury.  Using medicines for pain or to prevent or treat infection. Surgery may be needed if other treatments are not working or if your injury is very deep. Follow these instructions at home: Wound care  Follow instructions from your health care provider about how to take care of your wound. Make sure you: ? Wash your hands with soap and water before and after you change your bandage (dressing). If soap and water are not available, use hand sanitizer. ? Change your dressing as told by your health care provider.  Check your wound every day for signs of infection. Have a caregiver do this for you if you are not able. Check for: ? Redness, swelling, or increased pain. ? More fluid or blood. ? Warmth. ? Pus  or a bad smell. Skin care  Keep your skin clean and dry. Gently pat your skin dry.  Do not rub or massage your skin.  You or a caregiver should check your skin every day for any changes in color or any new blisters or sores (ulcers). Medicines  Take over-the-counter and prescription medicines only as told by your health care provider.  If you were prescribed an antibiotic medicine, take or apply it as told by your health care provider. Do not stop using the antibiotic even if your condition improves. Reducing and redistributing pressure  Do not lie or sit in one position for a long time. Move or change position every 1-2 hours, or as told by your health care provider.  Use pillows or cushions to reduce pressure. Ask your health care provider to recommend cushions or pads for you. General instructions  Eat a healthy diet that includes lots of protein.  Drink enough fluid to keep your urine pale yellow.  Be as active  as you can every day. Ask your health care provider to suggest safe exercises or activities.  Do not abuse drugs or alcohol.  Do not use any products that contain nicotine or tobacco, such as cigarettes, e-cigarettes, and chewing tobacco. If you need help quitting, ask your health care provider.  Keep all follow-up visits as told by your health care provider. This is important.   Contact a health care provider if:  You have: ? A fever or chills. ? Pain that is not helped by medicine. ? Any changes in skin color. ? New blisters or sores. ? Pus or a bad smell coming from your wound. ? Redness, swelling, or pain around your wound. ? More fluid or blood coming from your wound.  Your wound does not improve after 1-2 weeks of treatment. Summary  A pressure injury is damage to the skin and underlying tissue that results from pressure being applied to an area of the body.  Do not lie or sit in one position for a long time. Your health care provider may advise you to  move or change position every 1-2 hours.  Follow instructions from your health care provider about how to take care of your wound.  Keep all follow-up visits as told by your health care provider. This is important. This information is not intended to replace advice given to you by your health care provider. Make sure you discuss any questions you have with your health care provider. Document Revised: 10/01/2017 Document Reviewed: 10/01/2017 Elsevier Patient Education  [employer].

## 2020-08-08 NOTE — Progress Notes (Signed)
Physical Therapy Treatment Patient Details Name: Casey Humphrey MRN: [id number] DOB: 13-Feb-1946 Today's Date: 08/08/2020    History of Present Illness 75yo female admitted 5/16 with AMS, hallucinations and R facial droop. CT/MRI negative for acute CVA. Admitted with acute metabolic and infectious encephalopathy, UTI. PMH anginal pain, Bleeders disease, CHF, CAD, DM with neuropathy, HTN, L knee arthroscopy, MI, CVA    PT Comments    Patient received in bed, pleasant and cooperative. Doing much better with getting to EOB, however still having quite a bit of difficulty trying to stand- seems to have a lot of trouble coordinating pulling from stedy and pushing up to standing with BLEs. Made multiple attempts, but only able to partially clear hips today. Found to be covered in dried bowel movement which had soiled bandages- returned to supine and nursing staff alerted. Will continue to follow. Suspect she may be close to her baseline.    Follow Up Recommendations  Home health PT;Supervision/Assistance - 24 hour     Equipment Recommendations  Rolling walker with 5" wheels;3in1 (PT);Wheelchair (measurements PT);Wheelchair cushion (measurements PT);Hospital bed;Other (comment) (hoyer lift and pads)    Recommendations for Other Services       Precautions / Restrictions Precautions Precautions: Fall Precaution Comments: multiple wounds ( bilateral heels and buttocks) order is in for prevalon boots but have not arrived as of 5/23 Required Braces or Orthoses: Other Brace Other Brace: placed an order for B Prevalon boots Restrictions Weight Bearing Restrictions: No    Mobility  Bed Mobility Overal bed mobility: Needs Assistance Bed Mobility: Supine to Sit;Sit to Supine Rolling: Min assist   Supine to sit: Mod assist;+2 for physical assistance Sit to supine: Max assist;+2 for physical assistance   General bed mobility comments: still with modAx2 to manage trunk and BLEs, but able  to initiate and help push up into midline sitting much better today, good use of bed rail. MaxAx2 to return to supine.    Transfers Overall transfer level: Needs assistance Equipment used: Ambulation equipment used Transfers: Sit to/from Stand Sit to Stand: Max assist;+2 physical assistance;From elevated surface         General transfer comment: only able to perform partial stands in stedy, seems to have difficulty initiating coordination for pulling on stedy while pushing with BLEs  Ambulation/Gait             General Gait Details: unable   Stairs             Wheelchair Mobility    Modified Rankin (Stroke Patients Only)       Balance Overall balance assessment: Needs assistance Sitting-balance support: Feet supported;Bilateral upper extremity supported Sitting balance-Leahy Scale: Fair Sitting balance - Comments: close supervision to sit EOB     Standing balance-Leahy Scale: Zero                              Cognition Arousal/Alertness: Awake/alert Behavior During Therapy: Flat affect Overall Cognitive Status: No family/caregiver present to determine baseline cognitive functioning                   Orientation Level: Disoriented to;Time;Situation Current Attention Level: Sustained Memory: Decreased short-term memory Following Commands: Follows one step commands consistently;Follows one step commands with increased time Safety/Judgement: Decreased awareness of deficits Awareness: Intellectual Problem Solving: Slow processing;Decreased initiation;Difficulty sequencing;Requires verbal cues General Comments: flat and agreeable to session, followed cues well today but does need cues to problem  though functional mobility based situations      Exercises      General Comments        Pertinent Vitals/Pain Pain Assessment: Faces Faces Pain Scale: No hurt Pain Intervention(s): Limited activity within patient's tolerance;Monitored  during session    Home Living                      Prior Function            PT Goals (current goals can now be found in the care plan section) Acute Rehab PT Goals Patient Stated Goal: none stated PT Goal Formulation: With patient Time For Goal Achievement: 08/15/20 Potential to Achieve Goals: Fair Progress towards PT goals: Progressing toward goals    Frequency    Min 3X/week      PT Plan Discharge plan needs to be updated    Co-evaluation              AM-PAC PT "6 Clicks" Mobility   Outcome Measure  Help needed turning from your back to your side while in a flat bed without using bedrails?: A Little Help needed moving from lying on your back to sitting on the side of a flat bed without using bedrails?: A Lot Help needed moving to and from a bed to a chair (including a wheelchair)?: Total Help needed standing up from a chair using your arms (e.g., wheelchair or bedside chair)?: Total Help needed to walk in hospital room?: Total Help needed climbing 3-5 steps with a railing? : Total 6 Click Score: 9    End of Session Equipment Utilized During Treatment: Gait belt Activity Tolerance: Patient tolerated treatment well Patient left: in bed;with call bell/phone within reach Nurse Communication: Mobility status PT Visit Diagnosis: Unsteadiness on feet (R26.81);Difficulty in walking, not elsewhere classified (R26.2);Muscle weakness (generalized) (M62.81)     Time: 6948-5462 PT Time Calculation (min) (ACUTE ONLY): 24 min  Charges:  $Therapeutic Activity: 23-37 mins                     Windell Norfolk, DPT, PN1   Supplemental Physical Therapist [place]    Pager [phone] Acute Rehab Office [phone]

## 2020-08-08 NOTE — Progress Notes (Signed)
As the Physician Advisor on for today, I was requested by the [facility] team to assist in the discharge disposition of this patient. I briefly reviewed the chart and discussed with [name] Insurance claims handler.  As per chart review, 75 year old female, from home, bedbound at baseline, supervised by her daughter who she lives with and cared for by her sister who lives a short distance away.  Has all necessary DME at home.  Has exhausted her Medicare SNF days.  Is not eligible for Medicaid.  She is custodial and family unable to private pay for SNF or LTC.  Family agreeable for patient to return home with home health services under their care and supervision.  Based on chart review, see no medical intensity for ongoing hospital care. Unless there are any other barriers, patient should be able to return home.  Communicated with attending provider team and TOC via secure chat.  Vernell Leep, MD, [place], [facility]. [facility]  To contact the attending provider between 7A-7P or the covering provider during after hours 7P-7A, please log into the web site [email or web address] and access using universal [place] password for that web site. If you do not have the password, please call the hospital operator.

## 2020-08-08 NOTE — TOC Transition Note (Signed)
Transition of Care [facility]) - CM/SW Discharge Note   Patient Details  Name: Casey Humphrey MRN: [id number] Date of Birth: Aug 14, 1945  [facility]) CM/SW Contact:  Carles Collet, RN Phone Number: 08/08/2020, 3:42 PM   Clinical Narrative:    Casey Humphrey w sister Casey Humphrey on the phone.  She is agreeable to DC this evening via PTAR. Forms on chart, PTAR called for pickup. Notified RN via secure chat.  Notified [facility].   Casey Humphrey expressed deep appreciation for all staff who cared for the patient.     Final next level of care: [place] Barriers to Discharge: No Barriers Identified   Patient Goals and CMS Choice Patient states their goals for this hospitalization and ongoing recovery are:: Return home CMS Medicare.gov Compare Post Acute Care list provided to:: Patient Represenative (must comment) Choice offered to / list presented to : Sibling  Discharge Placement                       Discharge Plan and Services In-house Referral: Clinical Social Work Discharge Planning Services: CM Consult Post Acute Care Choice: Resumption of Svcs/PTA Provider,Home Health                    HH Arranged: PT,OT [place] Date [place]: 08/08/20 Time [place]: [id number] Representative spoke with at [place]: [place] Determinants of Health ([place]) Interventions     Readmission Risk Interventions No flowsheet data found.

## 2020-08-09 ENCOUNTER — Other Ambulatory Visit: Payer: Self-pay

## 2020-08-09 DIAGNOSIS — G9340 Encephalopathy, unspecified: Secondary | ICD-10-CM

## 2020-08-09 DIAGNOSIS — I5032 Chronic diastolic (congestive) heart failure: Secondary | ICD-10-CM

## 2020-08-09 DIAGNOSIS — I110 Hypertensive heart disease with heart failure: Secondary | ICD-10-CM

## 2020-08-10 DIAGNOSIS — I110 Hypertensive heart disease with heart failure: Secondary | ICD-10-CM | POA: Diagnosis not present

## 2020-08-10 DIAGNOSIS — R918 Other nonspecific abnormal finding of lung field: Secondary | ICD-10-CM | POA: Diagnosis not present

## 2020-08-10 DIAGNOSIS — L89152 Pressure ulcer of sacral region, stage 2: Secondary | ICD-10-CM | POA: Diagnosis not present

## 2020-08-10 DIAGNOSIS — S90821D Blister (nonthermal), right foot, subsequent encounter: Secondary | ICD-10-CM | POA: Diagnosis not present

## 2020-08-10 DIAGNOSIS — Z7984 Long term (current) use of oral hypoglycemic drugs: Secondary | ICD-10-CM | POA: Diagnosis not present

## 2020-08-10 DIAGNOSIS — Z8616 Personal history of COVID-19: Secondary | ICD-10-CM | POA: Diagnosis not present

## 2020-08-10 DIAGNOSIS — S90421D Blister (nonthermal), right great toe, subsequent encounter: Secondary | ICD-10-CM | POA: Diagnosis not present

## 2020-08-10 DIAGNOSIS — D631 Anemia in chronic kidney disease: Secondary | ICD-10-CM | POA: Diagnosis not present

## 2020-08-10 DIAGNOSIS — S70221D Blister (nonthermal), right hip, subsequent encounter: Secondary | ICD-10-CM | POA: Diagnosis not present

## 2020-08-10 DIAGNOSIS — N182 Chronic kidney disease, stage 2 (mild): Secondary | ICD-10-CM | POA: Diagnosis not present

## 2020-08-10 DIAGNOSIS — N390 Urinary tract infection, site not specified: Secondary | ICD-10-CM | POA: Diagnosis not present

## 2020-08-10 DIAGNOSIS — D696 Thrombocytopenia, unspecified: Secondary | ICD-10-CM | POA: Diagnosis not present

## 2020-08-10 DIAGNOSIS — E1122 Type 2 diabetes mellitus with diabetic chronic kidney disease: Secondary | ICD-10-CM | POA: Diagnosis not present

## 2020-08-10 DIAGNOSIS — I2510 Atherosclerotic heart disease of native coronary artery without angina pectoris: Secondary | ICD-10-CM | POA: Diagnosis not present

## 2020-08-10 DIAGNOSIS — I5030 Unspecified diastolic (congestive) heart failure: Secondary | ICD-10-CM | POA: Diagnosis not present

## 2020-08-16 ENCOUNTER — Other Ambulatory Visit: Payer: Self-pay | Admitting: *Deleted

## 2020-08-16 DIAGNOSIS — S90421D Blister (nonthermal), right great toe, subsequent encounter: Secondary | ICD-10-CM | POA: Diagnosis not present

## 2020-08-16 DIAGNOSIS — S90821D Blister (nonthermal), right foot, subsequent encounter: Secondary | ICD-10-CM | POA: Diagnosis not present

## 2020-08-16 DIAGNOSIS — R918 Other nonspecific abnormal finding of lung field: Secondary | ICD-10-CM | POA: Diagnosis not present

## 2020-08-16 DIAGNOSIS — S70221D Blister (nonthermal), right hip, subsequent encounter: Secondary | ICD-10-CM | POA: Diagnosis not present

## 2020-08-16 DIAGNOSIS — Z7984 Long term (current) use of oral hypoglycemic drugs: Secondary | ICD-10-CM | POA: Diagnosis not present

## 2020-08-16 DIAGNOSIS — D696 Thrombocytopenia, unspecified: Secondary | ICD-10-CM | POA: Diagnosis not present

## 2020-08-16 DIAGNOSIS — N390 Urinary tract infection, site not specified: Secondary | ICD-10-CM | POA: Diagnosis not present

## 2020-08-16 DIAGNOSIS — I110 Hypertensive heart disease with heart failure: Secondary | ICD-10-CM | POA: Diagnosis not present

## 2020-08-16 DIAGNOSIS — L89152 Pressure ulcer of sacral region, stage 2: Secondary | ICD-10-CM | POA: Diagnosis not present

## 2020-08-16 DIAGNOSIS — N182 Chronic kidney disease, stage 2 (mild): Secondary | ICD-10-CM | POA: Diagnosis not present

## 2020-08-16 DIAGNOSIS — I5030 Unspecified diastolic (congestive) heart failure: Secondary | ICD-10-CM | POA: Diagnosis not present

## 2020-08-16 DIAGNOSIS — D631 Anemia in chronic kidney disease: Secondary | ICD-10-CM | POA: Diagnosis not present

## 2020-08-16 DIAGNOSIS — I2510 Atherosclerotic heart disease of native coronary artery without angina pectoris: Secondary | ICD-10-CM | POA: Diagnosis not present

## 2020-08-16 DIAGNOSIS — Z8616 Personal history of COVID-19: Secondary | ICD-10-CM | POA: Diagnosis not present

## 2020-08-16 DIAGNOSIS — E1122 Type 2 diabetes mellitus with diabetic chronic kidney disease: Secondary | ICD-10-CM | POA: Diagnosis not present

## 2020-08-16 NOTE — Patient Outreach (Signed)
[name] Care Management  08/16/2020  Casey Humphrey 23-Oct-1945 [id number]   Referral Date: 5/24 Referral Source: hospital liaison Referral Reason: [place] hospital discharge Insurance: Power attempt #1, successful to daughter (DPR on file).  Identity verified.  This care manager introduced self and stated purpose of call.  [facility] care management services explained.    Social: Lives with daughter, member's sister also helps with management of health care.  State member is not able to walk, depends on daughter for ADL's.    Conditions: Per chart, has history of HTN, CHG, DM, and Asthma.    Medications: Reviewed with daughter, report she is taking according to discharge instructions.  Has been placed back on Torsemide for extremity swelling.  Report member has been doing well since being placed back on Metformin, blood sugars in the low 100's.  Appointments: Had visit from heme health nurse today, they have been communicating with PCP office.  She does not have PCP visit scheduled as she is unable to get into vehicle. Discussed having a physician visit the home, daughter would like to discuss with member and member's sister first. Home health involved for PT and nursing.   Plan: RN CM will follow up within the next 2 weeks, no immediate needs identified.  Valente David, [place], MSN [place] [phone]

## 2020-08-18 DIAGNOSIS — I5030 Unspecified diastolic (congestive) heart failure: Secondary | ICD-10-CM | POA: Diagnosis not present

## 2020-08-18 DIAGNOSIS — Z7984 Long term (current) use of oral hypoglycemic drugs: Secondary | ICD-10-CM | POA: Diagnosis not present

## 2020-08-18 DIAGNOSIS — D631 Anemia in chronic kidney disease: Secondary | ICD-10-CM | POA: Diagnosis not present

## 2020-08-18 DIAGNOSIS — L89152 Pressure ulcer of sacral region, stage 2: Secondary | ICD-10-CM | POA: Diagnosis not present

## 2020-08-18 DIAGNOSIS — I110 Hypertensive heart disease with heart failure: Secondary | ICD-10-CM | POA: Diagnosis not present

## 2020-08-18 DIAGNOSIS — N182 Chronic kidney disease, stage 2 (mild): Secondary | ICD-10-CM | POA: Diagnosis not present

## 2020-08-18 DIAGNOSIS — I2510 Atherosclerotic heart disease of native coronary artery without angina pectoris: Secondary | ICD-10-CM | POA: Diagnosis not present

## 2020-08-18 DIAGNOSIS — S90421D Blister (nonthermal), right great toe, subsequent encounter: Secondary | ICD-10-CM | POA: Diagnosis not present

## 2020-08-18 DIAGNOSIS — N390 Urinary tract infection, site not specified: Secondary | ICD-10-CM | POA: Diagnosis not present

## 2020-08-18 DIAGNOSIS — R918 Other nonspecific abnormal finding of lung field: Secondary | ICD-10-CM | POA: Diagnosis not present

## 2020-08-18 DIAGNOSIS — S90821D Blister (nonthermal), right foot, subsequent encounter: Secondary | ICD-10-CM | POA: Diagnosis not present

## 2020-08-18 DIAGNOSIS — E1122 Type 2 diabetes mellitus with diabetic chronic kidney disease: Secondary | ICD-10-CM | POA: Diagnosis not present

## 2020-08-18 DIAGNOSIS — D696 Thrombocytopenia, unspecified: Secondary | ICD-10-CM | POA: Diagnosis not present

## 2020-08-18 DIAGNOSIS — Z8616 Personal history of COVID-19: Secondary | ICD-10-CM | POA: Diagnosis not present

## 2020-08-18 DIAGNOSIS — S70221D Blister (nonthermal), right hip, subsequent encounter: Secondary | ICD-10-CM | POA: Diagnosis not present

## 2020-08-21 DIAGNOSIS — D631 Anemia in chronic kidney disease: Secondary | ICD-10-CM | POA: Diagnosis not present

## 2020-08-21 DIAGNOSIS — N390 Urinary tract infection, site not specified: Secondary | ICD-10-CM | POA: Diagnosis not present

## 2020-08-21 DIAGNOSIS — S90821D Blister (nonthermal), right foot, subsequent encounter: Secondary | ICD-10-CM | POA: Diagnosis not present

## 2020-08-21 DIAGNOSIS — I5030 Unspecified diastolic (congestive) heart failure: Secondary | ICD-10-CM | POA: Diagnosis not present

## 2020-08-21 DIAGNOSIS — N182 Chronic kidney disease, stage 2 (mild): Secondary | ICD-10-CM | POA: Diagnosis not present

## 2020-08-21 DIAGNOSIS — Z7984 Long term (current) use of oral hypoglycemic drugs: Secondary | ICD-10-CM | POA: Diagnosis not present

## 2020-08-21 DIAGNOSIS — I2510 Atherosclerotic heart disease of native coronary artery without angina pectoris: Secondary | ICD-10-CM | POA: Diagnosis not present

## 2020-08-21 DIAGNOSIS — I110 Hypertensive heart disease with heart failure: Secondary | ICD-10-CM | POA: Diagnosis not present

## 2020-08-21 DIAGNOSIS — Z8616 Personal history of COVID-19: Secondary | ICD-10-CM | POA: Diagnosis not present

## 2020-08-21 DIAGNOSIS — E1122 Type 2 diabetes mellitus with diabetic chronic kidney disease: Secondary | ICD-10-CM | POA: Diagnosis not present

## 2020-08-21 DIAGNOSIS — S70221D Blister (nonthermal), right hip, subsequent encounter: Secondary | ICD-10-CM | POA: Diagnosis not present

## 2020-08-21 DIAGNOSIS — L89152 Pressure ulcer of sacral region, stage 2: Secondary | ICD-10-CM | POA: Diagnosis not present

## 2020-08-21 DIAGNOSIS — S90421D Blister (nonthermal), right great toe, subsequent encounter: Secondary | ICD-10-CM | POA: Diagnosis not present

## 2020-08-21 DIAGNOSIS — R918 Other nonspecific abnormal finding of lung field: Secondary | ICD-10-CM | POA: Diagnosis not present

## 2020-08-21 DIAGNOSIS — D696 Thrombocytopenia, unspecified: Secondary | ICD-10-CM | POA: Diagnosis not present

## 2020-08-22 DIAGNOSIS — S90421D Blister (nonthermal), right great toe, subsequent encounter: Secondary | ICD-10-CM | POA: Diagnosis not present

## 2020-08-22 DIAGNOSIS — N182 Chronic kidney disease, stage 2 (mild): Secondary | ICD-10-CM | POA: Diagnosis not present

## 2020-08-22 DIAGNOSIS — I110 Hypertensive heart disease with heart failure: Secondary | ICD-10-CM | POA: Diagnosis not present

## 2020-08-22 DIAGNOSIS — N390 Urinary tract infection, site not specified: Secondary | ICD-10-CM | POA: Diagnosis not present

## 2020-08-22 DIAGNOSIS — E1122 Type 2 diabetes mellitus with diabetic chronic kidney disease: Secondary | ICD-10-CM | POA: Diagnosis not present

## 2020-08-22 DIAGNOSIS — S70221D Blister (nonthermal), right hip, subsequent encounter: Secondary | ICD-10-CM | POA: Diagnosis not present

## 2020-08-22 DIAGNOSIS — D631 Anemia in chronic kidney disease: Secondary | ICD-10-CM | POA: Diagnosis not present

## 2020-08-22 DIAGNOSIS — Z8616 Personal history of COVID-19: Secondary | ICD-10-CM | POA: Diagnosis not present

## 2020-08-22 DIAGNOSIS — Z7984 Long term (current) use of oral hypoglycemic drugs: Secondary | ICD-10-CM | POA: Diagnosis not present

## 2020-08-22 DIAGNOSIS — R918 Other nonspecific abnormal finding of lung field: Secondary | ICD-10-CM | POA: Diagnosis not present

## 2020-08-22 DIAGNOSIS — I2510 Atherosclerotic heart disease of native coronary artery without angina pectoris: Secondary | ICD-10-CM | POA: Diagnosis not present

## 2020-08-22 DIAGNOSIS — I5030 Unspecified diastolic (congestive) heart failure: Secondary | ICD-10-CM | POA: Diagnosis not present

## 2020-08-22 DIAGNOSIS — D696 Thrombocytopenia, unspecified: Secondary | ICD-10-CM | POA: Diagnosis not present

## 2020-08-22 DIAGNOSIS — L89152 Pressure ulcer of sacral region, stage 2: Secondary | ICD-10-CM | POA: Diagnosis not present

## 2020-08-22 DIAGNOSIS — S90821D Blister (nonthermal), right foot, subsequent encounter: Secondary | ICD-10-CM | POA: Diagnosis not present

## 2020-08-24 DIAGNOSIS — R918 Other nonspecific abnormal finding of lung field: Secondary | ICD-10-CM | POA: Diagnosis not present

## 2020-08-24 DIAGNOSIS — E1122 Type 2 diabetes mellitus with diabetic chronic kidney disease: Secondary | ICD-10-CM | POA: Diagnosis not present

## 2020-08-24 DIAGNOSIS — I110 Hypertensive heart disease with heart failure: Secondary | ICD-10-CM | POA: Diagnosis not present

## 2020-08-24 DIAGNOSIS — N390 Urinary tract infection, site not specified: Secondary | ICD-10-CM | POA: Diagnosis not present

## 2020-08-24 DIAGNOSIS — D696 Thrombocytopenia, unspecified: Secondary | ICD-10-CM | POA: Diagnosis not present

## 2020-08-24 DIAGNOSIS — Z8616 Personal history of COVID-19: Secondary | ICD-10-CM | POA: Diagnosis not present

## 2020-08-24 DIAGNOSIS — D631 Anemia in chronic kidney disease: Secondary | ICD-10-CM | POA: Diagnosis not present

## 2020-08-24 DIAGNOSIS — N182 Chronic kidney disease, stage 2 (mild): Secondary | ICD-10-CM | POA: Diagnosis not present

## 2020-08-24 DIAGNOSIS — I5030 Unspecified diastolic (congestive) heart failure: Secondary | ICD-10-CM | POA: Diagnosis not present

## 2020-08-24 DIAGNOSIS — I2510 Atherosclerotic heart disease of native coronary artery without angina pectoris: Secondary | ICD-10-CM | POA: Diagnosis not present

## 2020-08-24 DIAGNOSIS — L89152 Pressure ulcer of sacral region, stage 2: Secondary | ICD-10-CM | POA: Diagnosis not present

## 2020-08-24 DIAGNOSIS — Z7984 Long term (current) use of oral hypoglycemic drugs: Secondary | ICD-10-CM | POA: Diagnosis not present

## 2020-08-24 DIAGNOSIS — S90821D Blister (nonthermal), right foot, subsequent encounter: Secondary | ICD-10-CM | POA: Diagnosis not present

## 2020-08-24 DIAGNOSIS — S70221D Blister (nonthermal), right hip, subsequent encounter: Secondary | ICD-10-CM | POA: Diagnosis not present

## 2020-08-24 DIAGNOSIS — S90421D Blister (nonthermal), right great toe, subsequent encounter: Secondary | ICD-10-CM | POA: Diagnosis not present

## 2020-08-27 DIAGNOSIS — S7292XD Unspecified fracture of left femur, subsequent encounter for closed fracture with routine healing: Secondary | ICD-10-CM | POA: Diagnosis not present

## 2020-08-30 ENCOUNTER — Other Ambulatory Visit: Payer: Self-pay | Admitting: *Deleted

## 2020-08-30 DIAGNOSIS — N390 Urinary tract infection, site not specified: Secondary | ICD-10-CM | POA: Diagnosis not present

## 2020-08-30 NOTE — Patient Outreach (Signed)
[facility]) Care Management  08/30/2020  Casey Humphrey April 22, 1945 [id number]   Follow up call placed to member to complete assessment.  Notified by daughter that member is active with hospice.  Call placed to [facility], confirmed member is active with their program.  Will close case at this time.  Valente David, [place], MSN [place] [phone]

## 2020-09-26 DIAGNOSIS — S7292XD Unspecified fracture of left femur, subsequent encounter for closed fracture with routine healing: Secondary | ICD-10-CM | POA: Diagnosis not present

## 2020-10-16 DEATH — deceased

## 2021-06-07 IMAGING — US US RENAL
1 series · 14 of 25 positions shown · non-contrast
Comparison: CT 06/15/2020

CLINICAL DATA: Acute kidney injury

EXAM:
RENAL / URINARY TRACT ULTRASOUND COMPLETE

[Series 1: us renal · 14 of 68 slices shown]
[im 1/68]
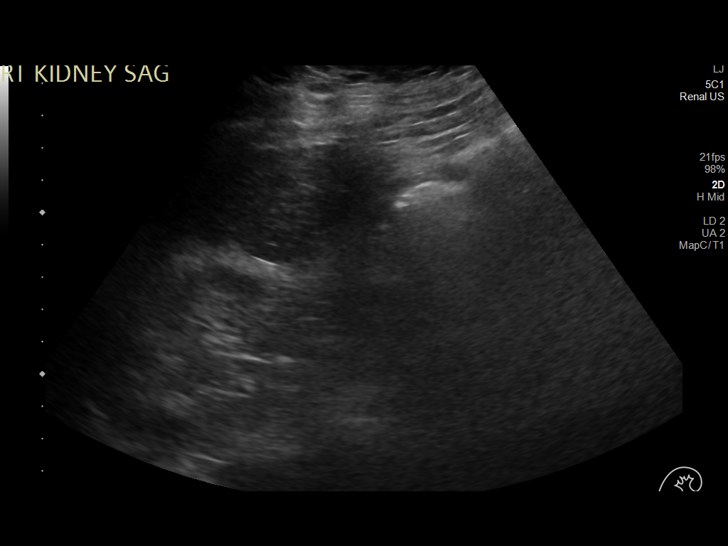
[im 6/68]
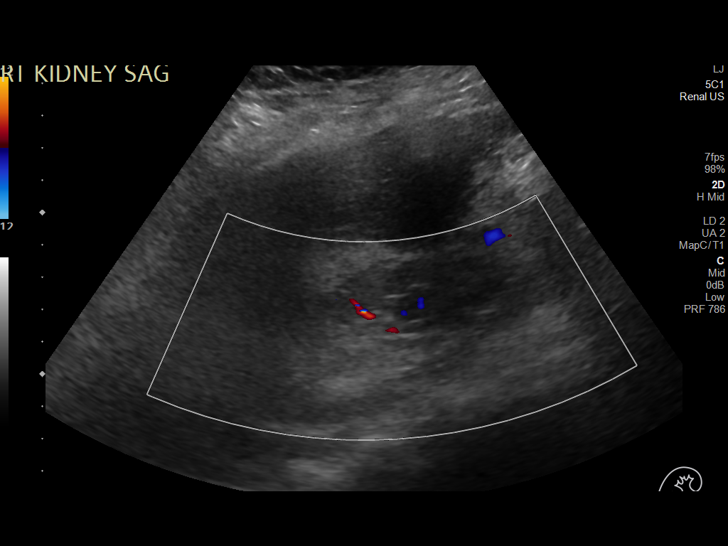
[im 12/68]
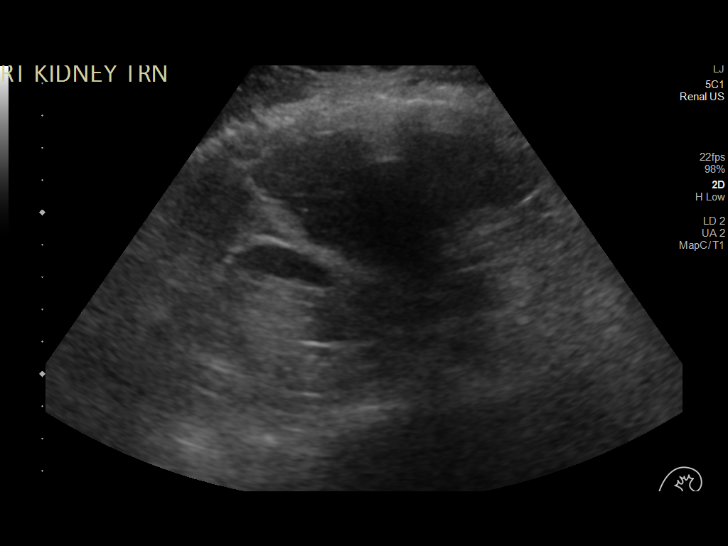
[im 17/68]
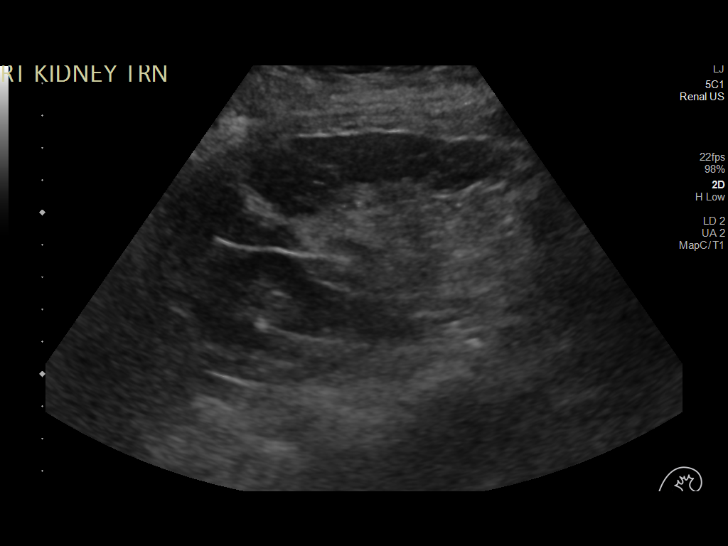
[im 23/68]
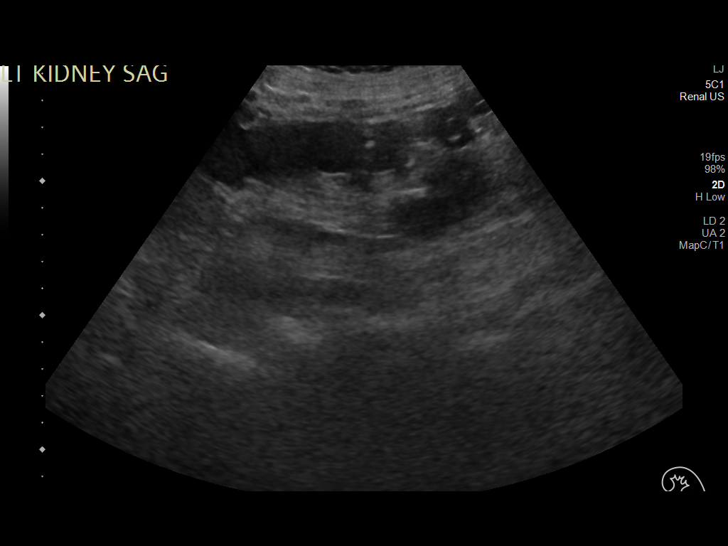
[im 26/68]
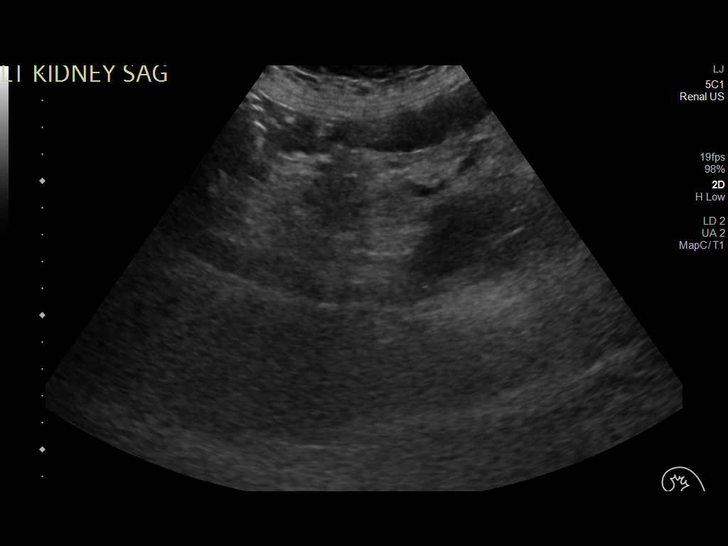
[im 31/68]
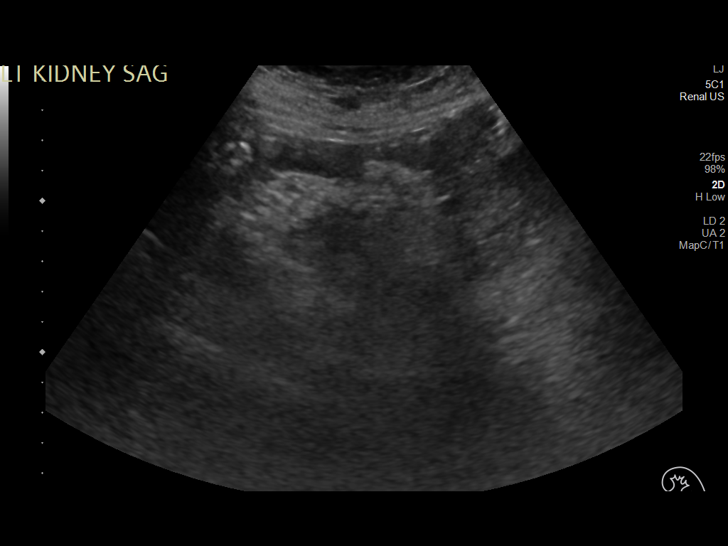
[im 37/68]
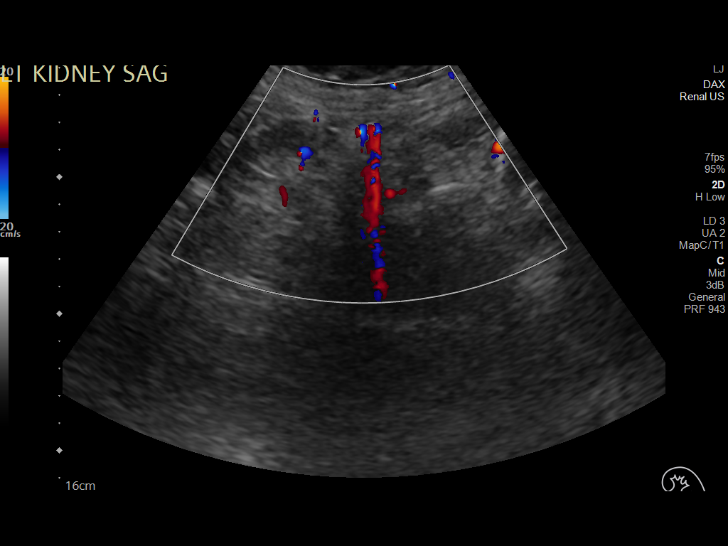
[im 42/68]
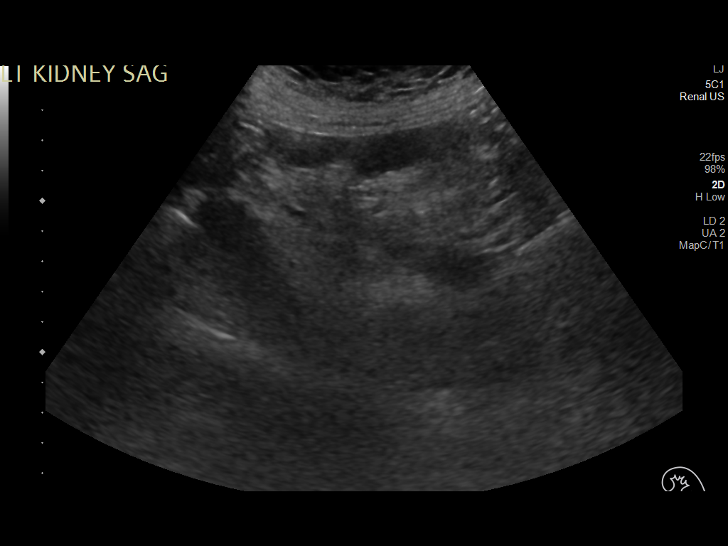
[im 45/68]
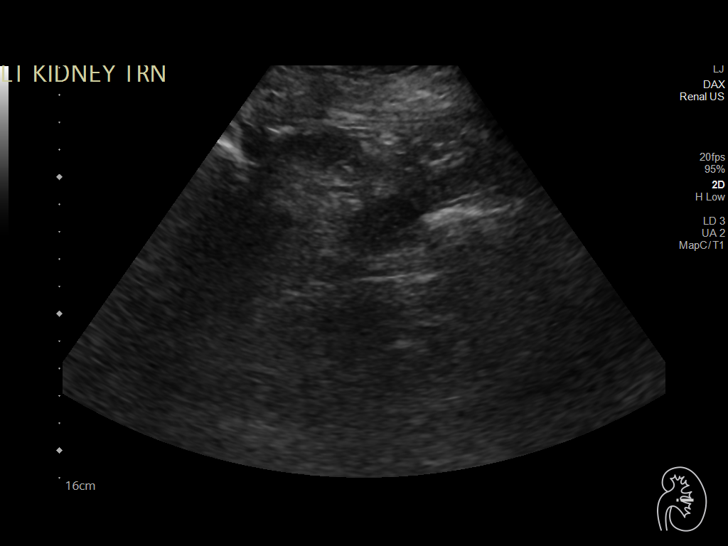
[im 51/68]
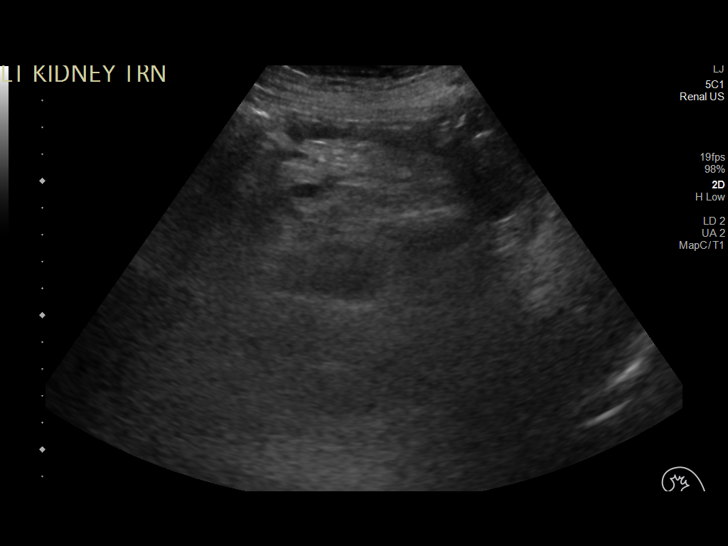
[im 56/68]
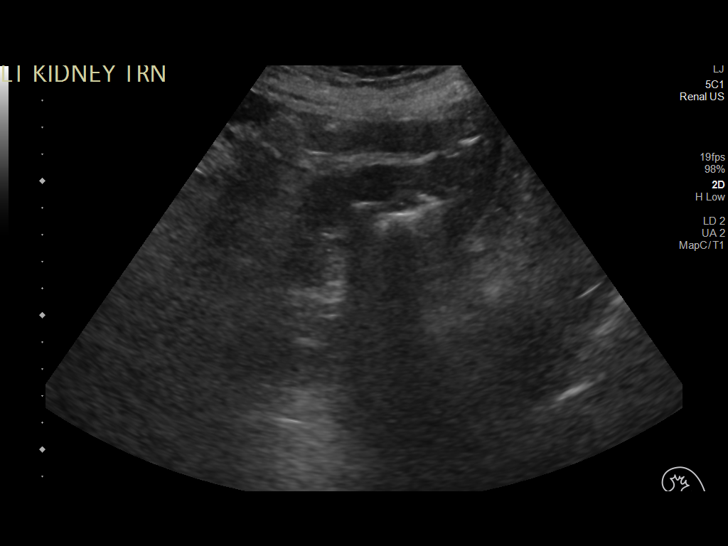
[im 62/68]
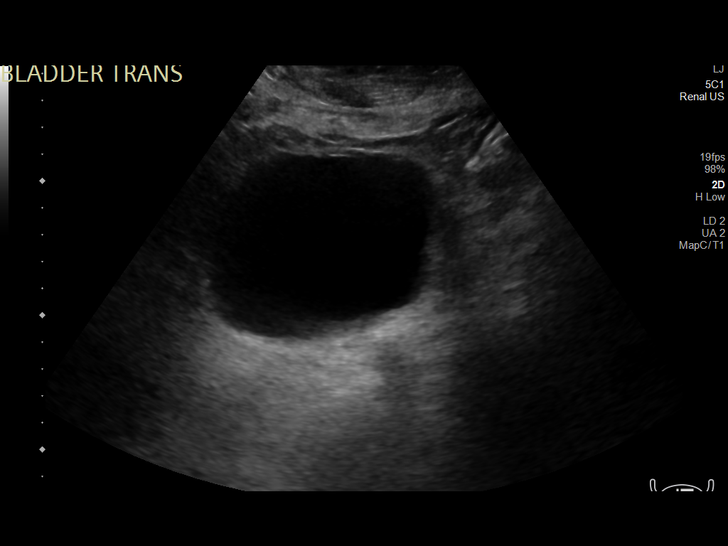
[im 68/68]
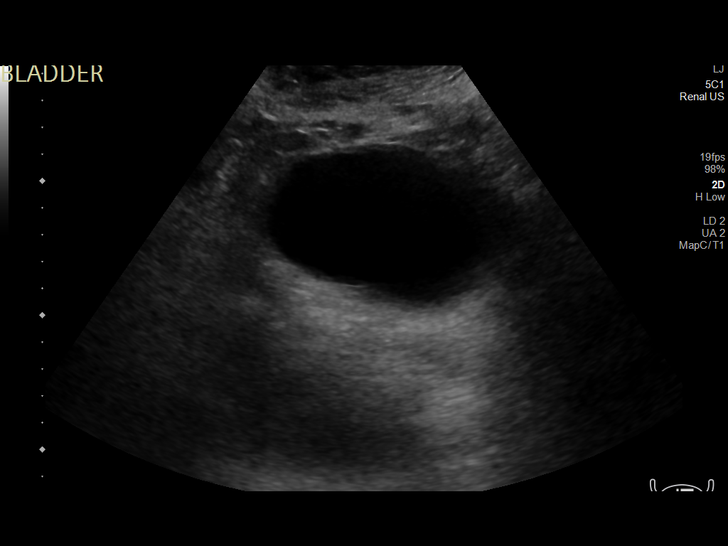

[14 of 25 positions shown; findings below may reference images not displayed]

FINDINGS: Right Kidney:

Renal measurements: 9.3 x 4.7 x 4.1 cm = volume: 93.9 mL.
Echogenicity within normal limits. No mass or hydronephrosis

Left Kidney:

Renal measurements: 10.6 x 4.7 x 7 cm = volume: 182. mL. Difficult
visualization. Echogenicity grossly within normal limits. No
hydronephrosis. Questionable cortical calcification measuring 12 mm
at the mid left kidney, though no calcified stone on CT from [REDACTED].
Vague hypoechoic area at mid pole left kidney measuring 20 mm.

Bladder:

Appears normal for degree of bladder distention.

Other:

None.
IMPRESSION: 1. Negative for hydronephrosis.
2. Poorly visible left kidney secondary to bowel gas and mobility.
Questionable hypoechoic lesion at the mid left kidney for which
sonographic follow-up is recommended.

## 2021-06-07 IMAGING — CT CT HEAD CODE STROKE
4 series · 16 of 47 positions shown, 18 images · non-contrast
Comparison: CT head 12/22/2012

CLINICAL DATA: Code stroke. Acute neuro deficit. Right facial droop

EXAM:
CT HEAD WITHOUT CONTRAST
TECHNIQUE: Contiguous axial images were obtained from the base of the skull
through the vertex without intravenous contrast.

[Series 2: head wo · axial · 0.42mm/px · z∈[+1324,+1444]mm · 7 of 33 slices shown, 9 images]
[im 5/33  brain]
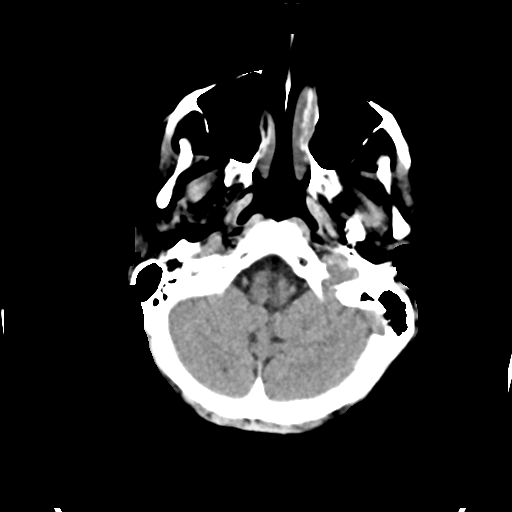
[im 5/33  bone]
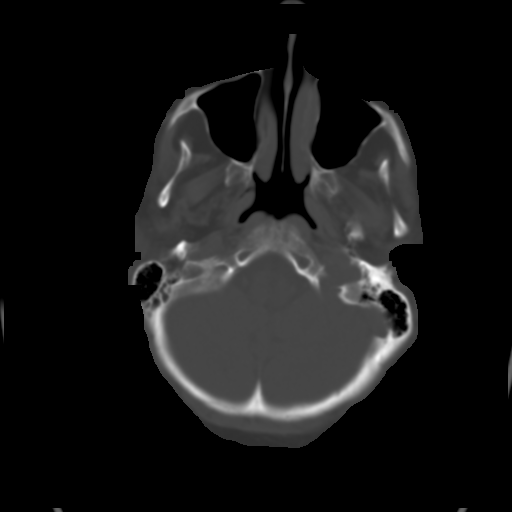
[im 9/33  brain]
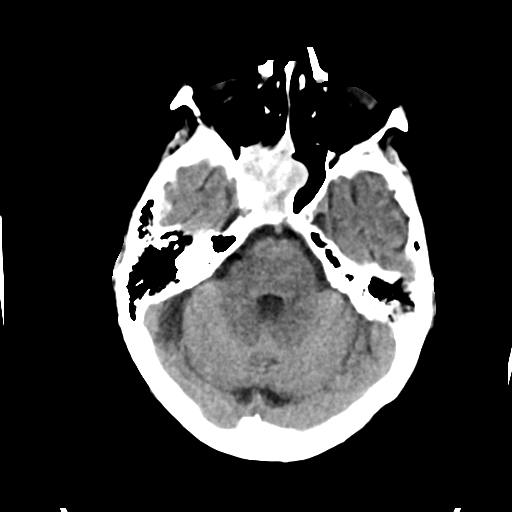
[im 13/33  brain]
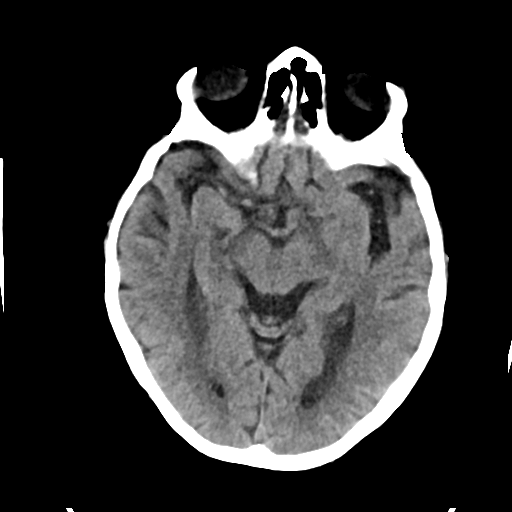
[im 17/33  brain]
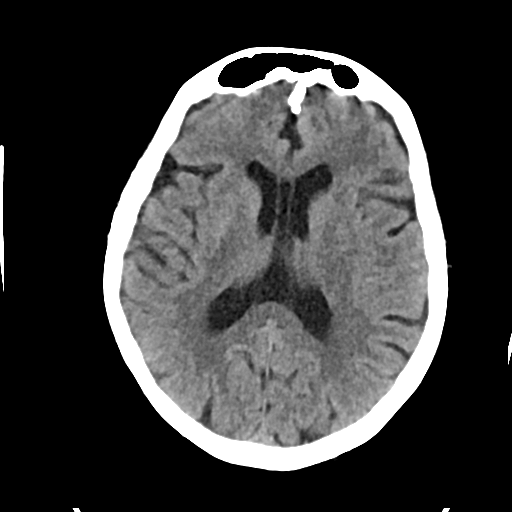
[im 21/33  brain]
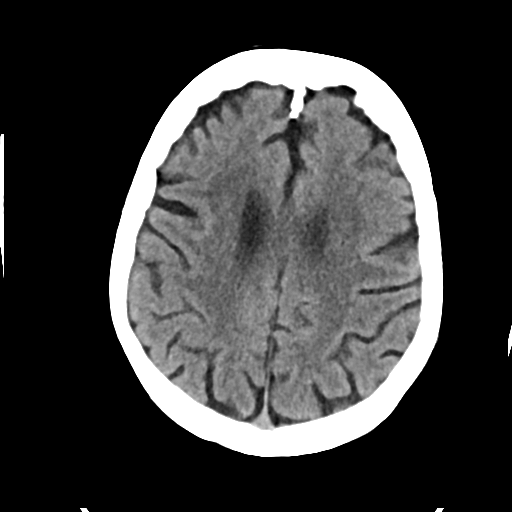
[im 21/33  bone]
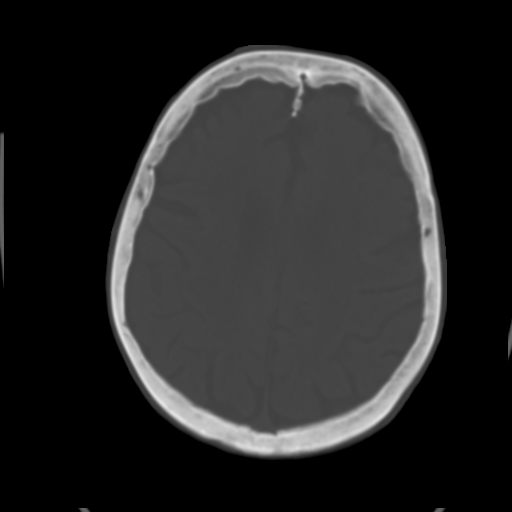
[im 25/33  brain]
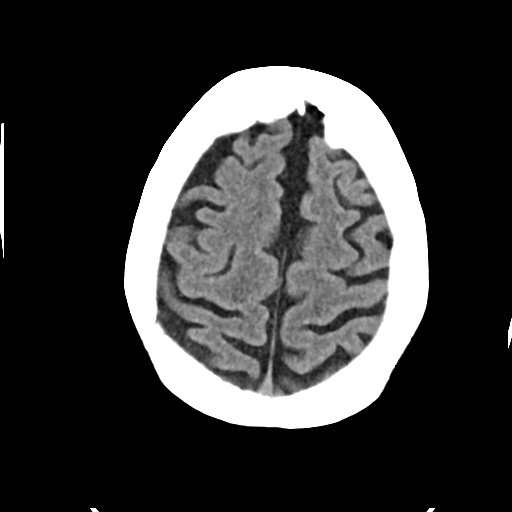
[im 29/33  brain]
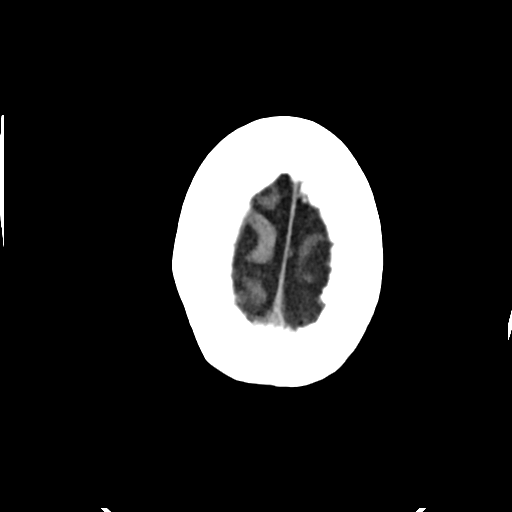

[Series 4: head bone · axial · 0.42mm/px · z∈[+1320,+1352]mm · 3 of 83 slices shown]
[im 9/83  bone]
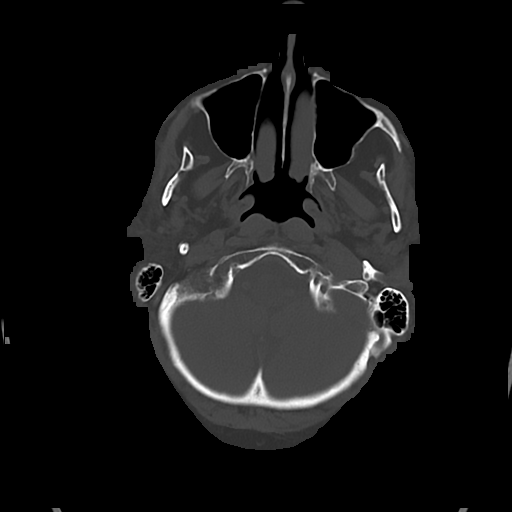
[im 17/83  bone]
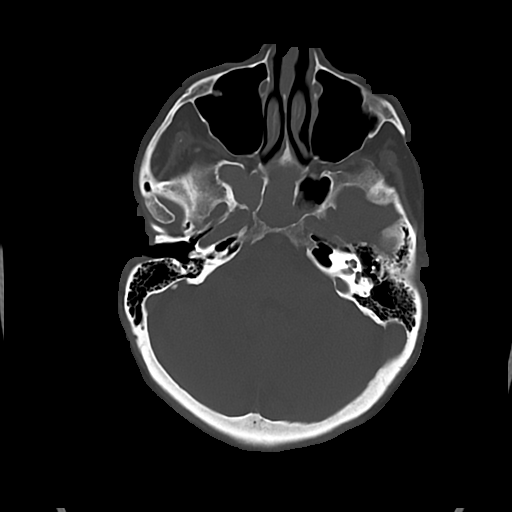
[im 25/83  bone]
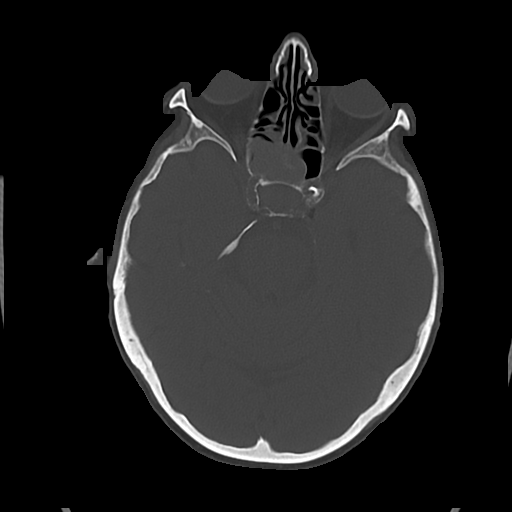

[Series 5: cor soft · coronal · 0.33mm/px · 3 of 72 slices shown]
[im 24/72  brain]
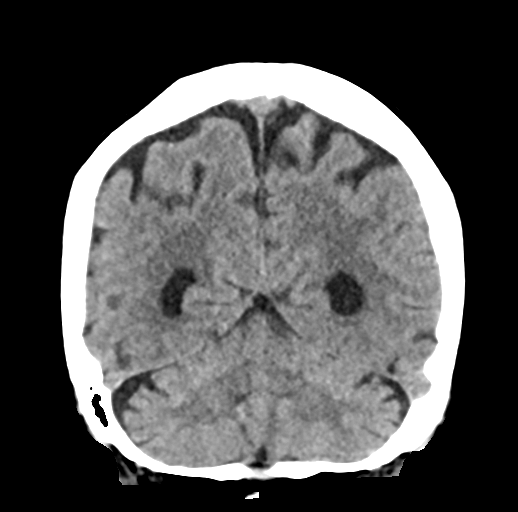
[im 32/72  brain]
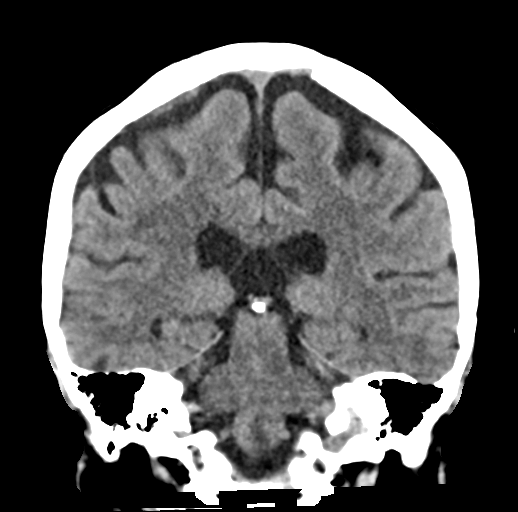
[im 40/72  brain]
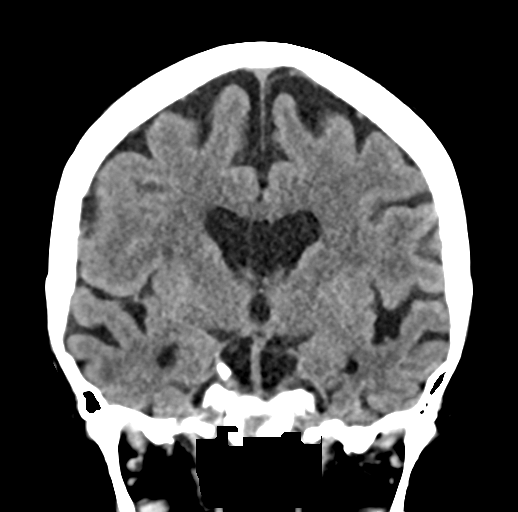

[Series 6: sag soft · sagittal · 0.33mm/px · 3 of 57 slices shown]
[im 19/57  brain]
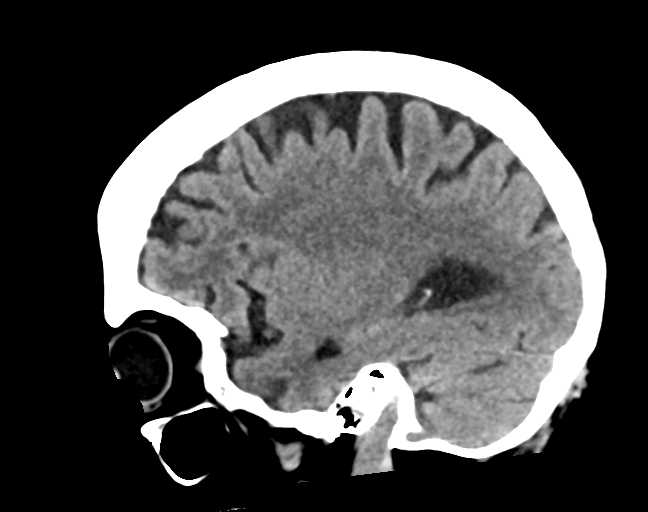
[im 29/57  brain]
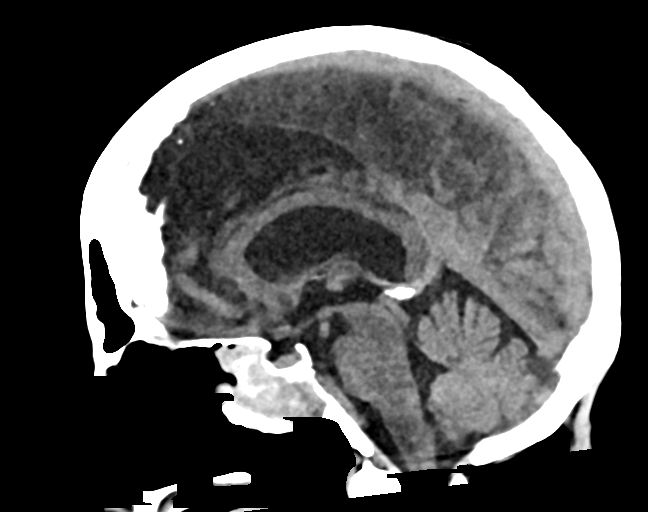
[im 38/57  brain]
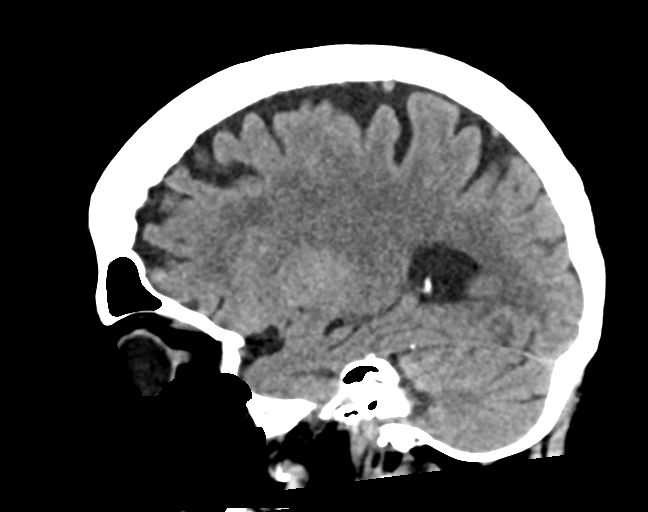

[16 of 47 positions shown; findings below may reference images not displayed]

FINDINGS: Brain: Progressive atrophy. Negative for hydrocephalus. Mild patchy
white matter hypodensity with progression.

Negative for acute infarct, hemorrhage, mass.

Vascular: Negative for hyperdense vessel

Skull: Negative

Sinuses/Orbits: Extensive opacification of the sphenoid sinus with
hyperdensity and expansion of the sinus on the right. This area was
clear previously. Remaining sinuses clear.

Other: None

ASPECTS (Alberta Stroke Program Early CT Score)

- Ganglionic level infarction (caudate, lentiform nuclei, internal
capsule, insula, M1-M3 cortex): 7

- Supraganglionic infarction (M4-M6 cortex): 3

Total score (0-10 with 10 being normal): 10
IMPRESSION: 1. No acute intracranial abnormality
2. ASPECTS is 10
3. Extensive expansile opacification and hyperdensity throughout the
right sphenoid sinus. This may be due to sinusitis with superimposed
fungal infection versus mass lesion. ENT consultation recommended.
4. Code stroke imaging results were communicated on 07/31/2020 at
[DATE] to provider Herikaryanto via text page

## 2021-06-07 IMAGING — DX DG CHEST 2V
2 series · 2 of 2 positions shown · non-contrast
Comparison: 09/23/2019

CLINICAL DATA: Chest pain, altered mental status

EXAM:
CHEST - 2 VIEW

[x chest ap]
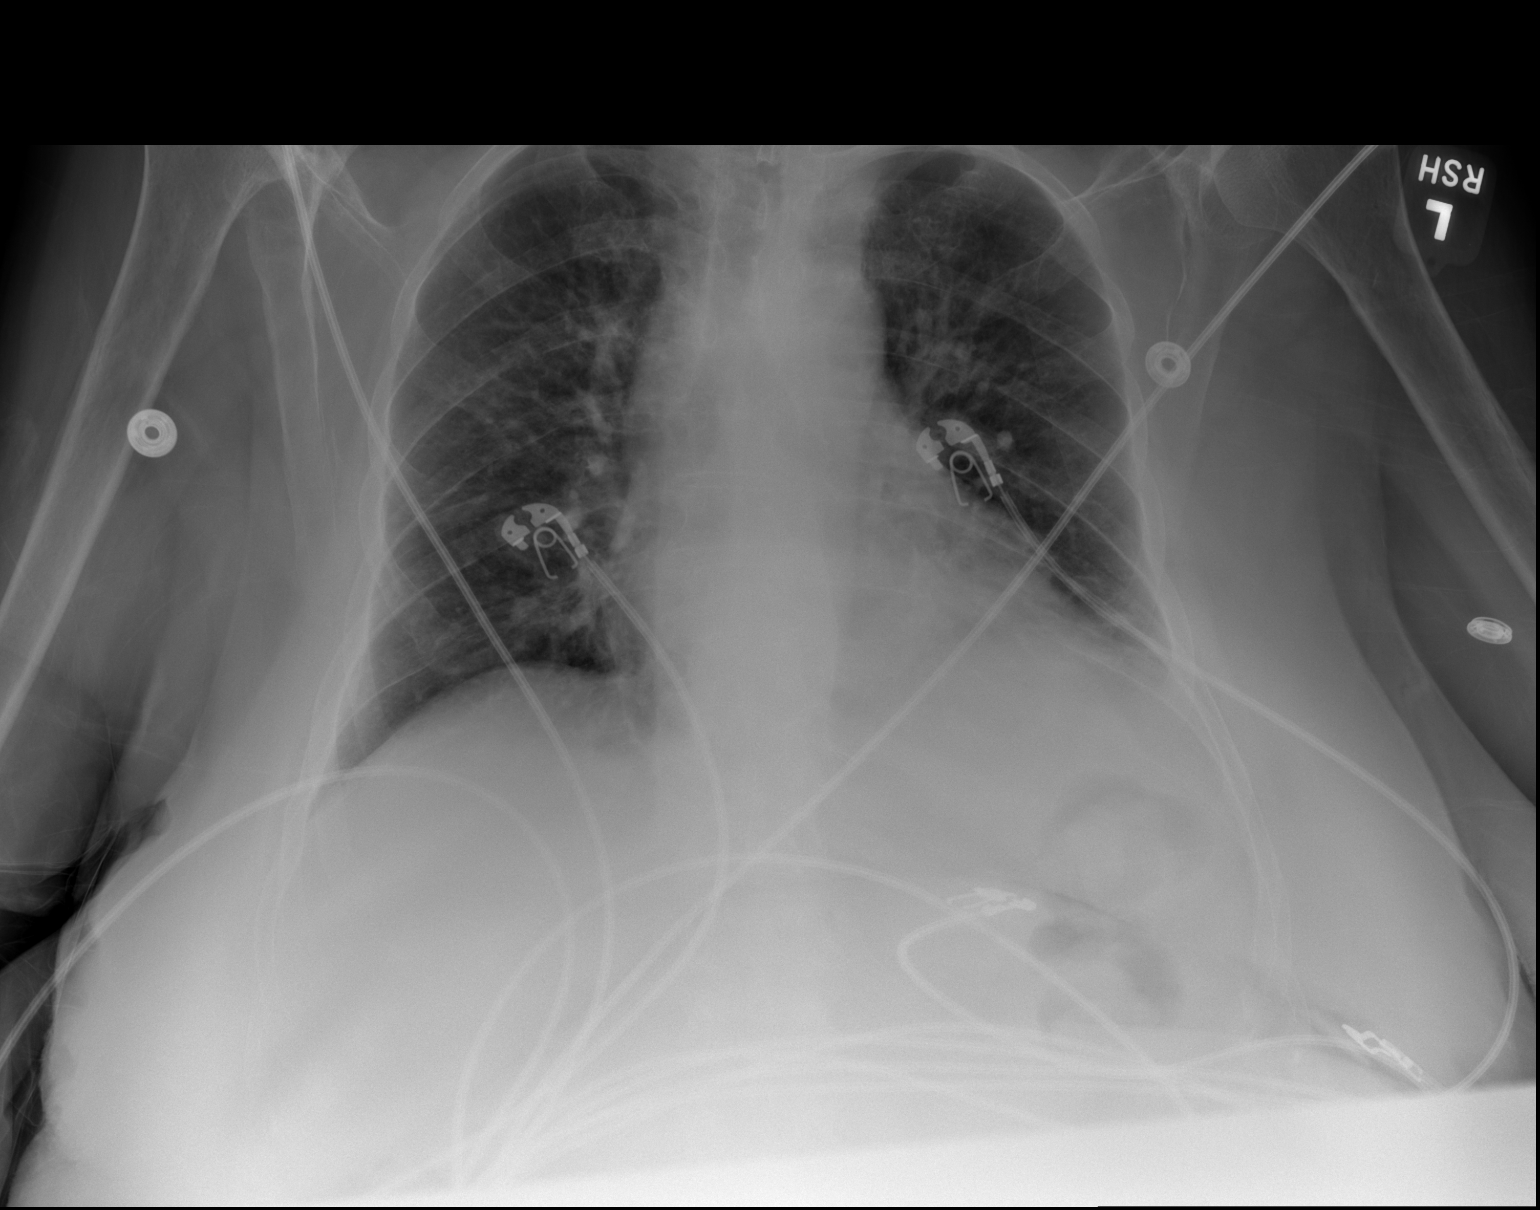

[w chest lat]
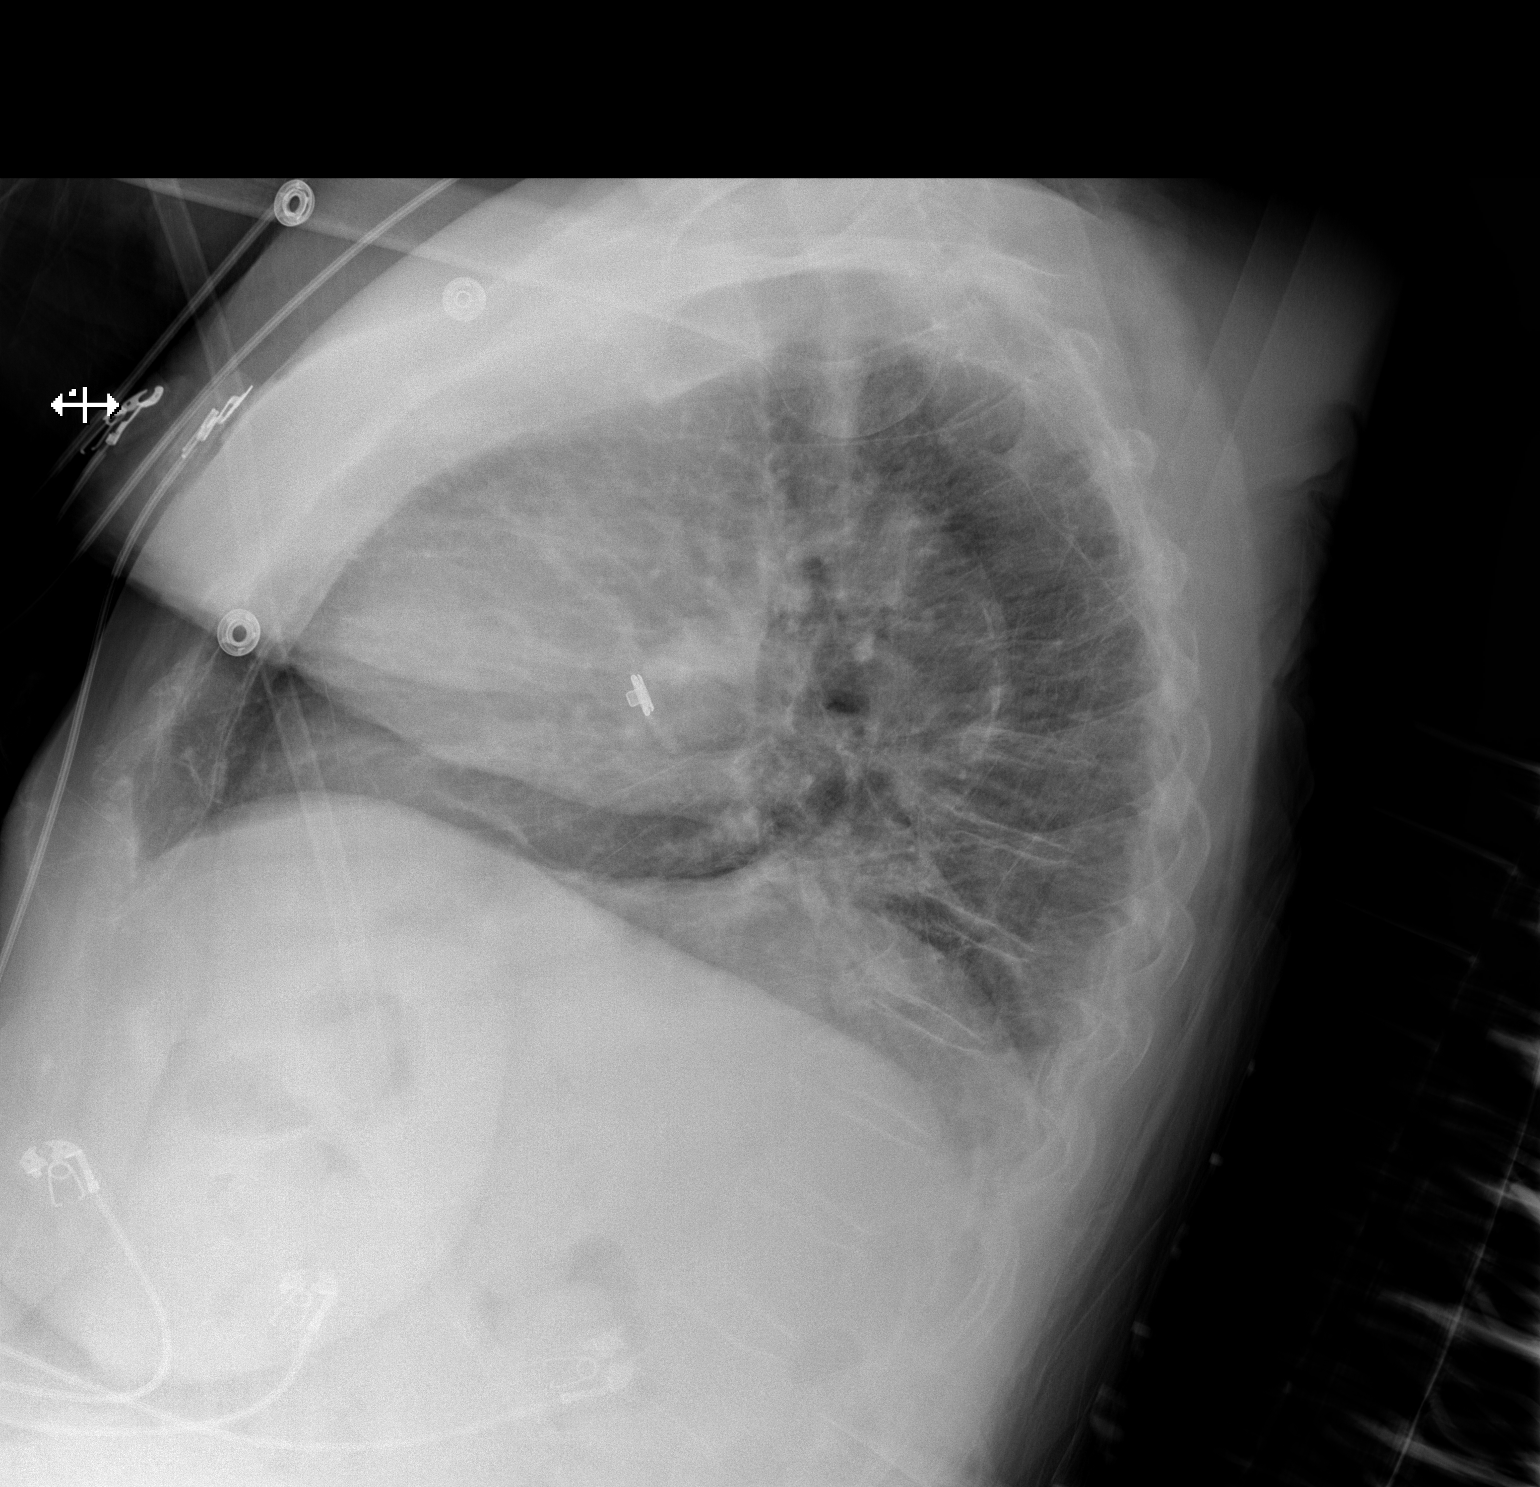

[2 of 2 positions shown; findings below may reference images not displayed]

FINDINGS: Heart is normal size. Left base atelectasis or infiltrate. Right
lung clear. No effusions or acute bony abnormality.
IMPRESSION: Left base atelectasis or infiltrate.

## 2021-06-08 IMAGING — DX DG KNEE 1-2V*L*
2 series · 2 of 2 positions shown · non-contrast
Comparison: 09/24/2019

CLINICAL DATA: Left knee pain after recent fall, prior distal
femoral ORIF on 09/24/2019

EXAM:
LEFT KNEE - 1-2 VIEW

[knee lat]
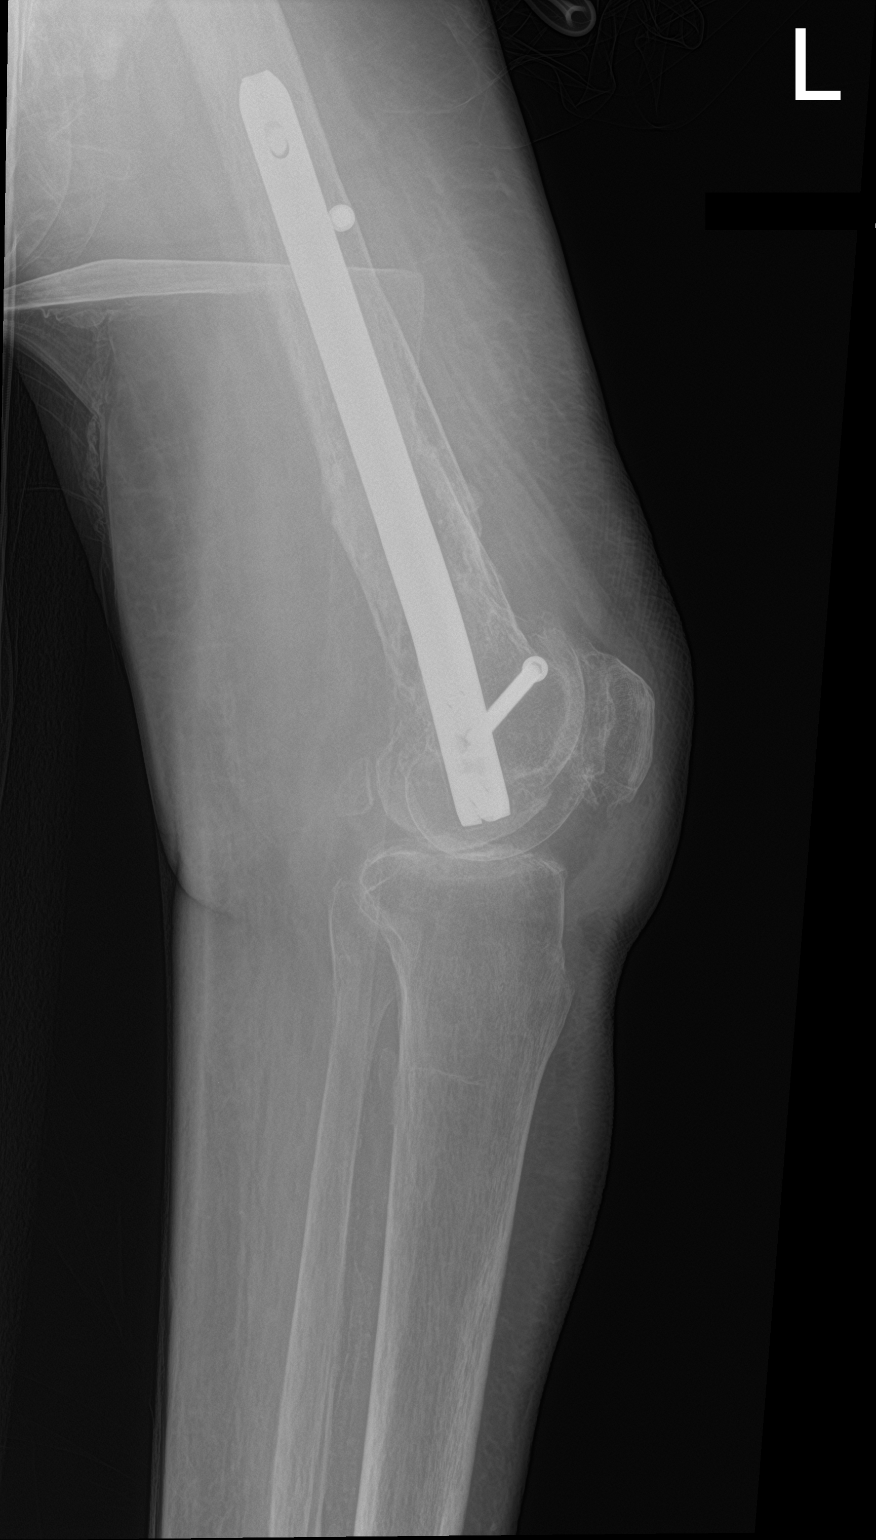

[knee ap]
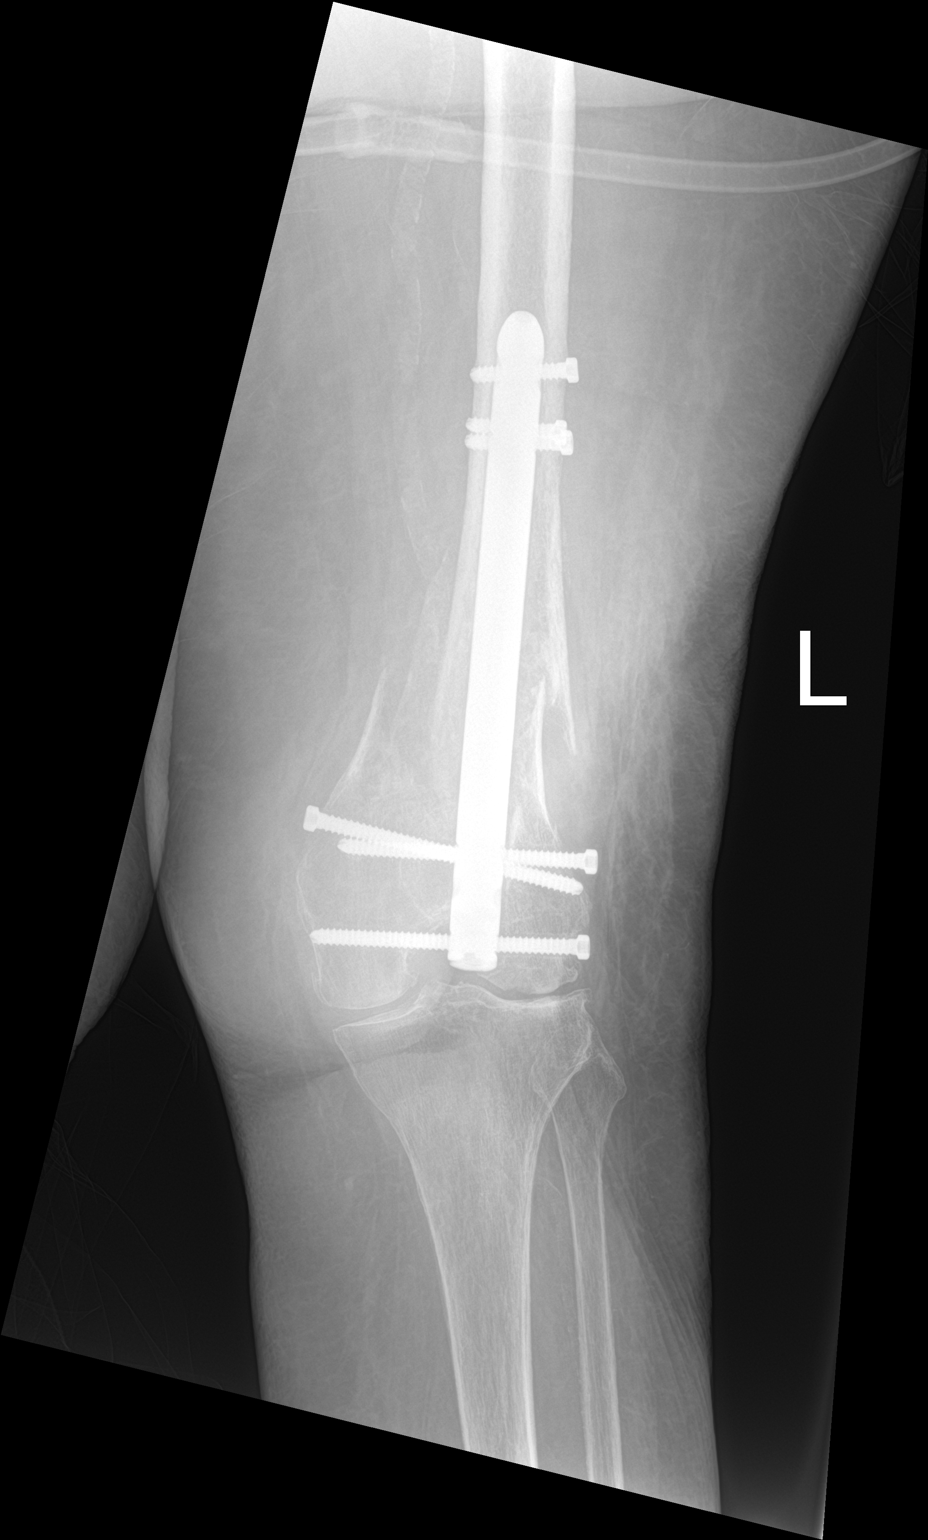

[2 of 2 positions shown; findings below may reference images not displayed]

FINDINGS: Retrograde IM rod in the distal femur, one of the distal femoral
screws has been removed compared to 09/24/2019. There is still some
substantial in somewhat irregular deformity in the vicinity of the
prior fracture although particularly on the lateral projection there
appears to be bony bridging. I do not see a new fracture although
the appearance in the vicinity of the partially healed fracture is
somewhat complex. Underlying bony demineralization is present and
there is osteoarthritis of the knee with tricompartmental spurring
and articular space loss. SFA atherosclerosis noted.
IMPRESSION: 1. Residual deformity in the vicinity of the prior distal femoral
fracture, traversed by a retrograde IM nail with proximal and distal
interlocking screws. I do not see a definite new fracture although
the appearance in the vicinity of the previous fracture remains
somewhat complex.
2. Osteoarthritis of the knee.
3. Bony demineralization.
4. Atherosclerosis.
